# Patient Record
Sex: Male | Born: 1955
Health system: Southern US, Community
[De-identification: ages and names within clinical notes are randomized; demographics above are authoritative.]

## PROBLEM LIST (undated history)

## (undated) DIAGNOSIS — K219 Gastro-esophageal reflux disease without esophagitis: Secondary | ICD-10-CM

## (undated) DIAGNOSIS — F419 Anxiety disorder, unspecified: Secondary | ICD-10-CM

## (undated) DIAGNOSIS — M1990 Unspecified osteoarthritis, unspecified site: Secondary | ICD-10-CM

## (undated) DIAGNOSIS — F329 Major depressive disorder, single episode, unspecified: Secondary | ICD-10-CM

## (undated) DIAGNOSIS — I771 Stricture of artery: Secondary | ICD-10-CM

## (undated) DIAGNOSIS — K2210 Ulcer of esophagus without bleeding: Secondary | ICD-10-CM

## (undated) DIAGNOSIS — Z8673 Personal history of transient ischemic attack (TIA), and cerebral infarction without residual deficits: Secondary | ICD-10-CM

## (undated) DIAGNOSIS — E785 Hyperlipidemia, unspecified: Secondary | ICD-10-CM

## (undated) DIAGNOSIS — E119 Type 2 diabetes mellitus without complications: Secondary | ICD-10-CM

## (undated) DIAGNOSIS — F32A Depression, unspecified: Secondary | ICD-10-CM

## (undated) DIAGNOSIS — I739 Peripheral vascular disease, unspecified: Secondary | ICD-10-CM

## (undated) DIAGNOSIS — T7840XA Allergy, unspecified, initial encounter: Secondary | ICD-10-CM

## (undated) DIAGNOSIS — D128 Benign neoplasm of rectum: Secondary | ICD-10-CM

## (undated) DIAGNOSIS — I639 Cerebral infarction, unspecified: Secondary | ICD-10-CM

## (undated) HISTORY — DX: Hyperlipidemia, unspecified: E78.5

## (undated) HISTORY — DX: Cerebral infarction, unspecified: I63.9

## (undated) HISTORY — DX: Stricture of artery: I77.1

## (undated) HISTORY — DX: Major depressive disorder, single episode, unspecified: F32.9

## (undated) HISTORY — DX: Depression, unspecified: F32.A

## (undated) HISTORY — PX: COLONOSCOPY: SHX174

## (undated) HISTORY — DX: Type 2 diabetes mellitus without complications: E11.9

## (undated) HISTORY — DX: Gastro-esophageal reflux disease without esophagitis: K21.9

## (undated) HISTORY — DX: Peripheral vascular disease, unspecified: I73.9

## (undated) HISTORY — PX: COLON SURGERY: SHX602

## (undated) HISTORY — DX: Benign neoplasm of rectum: D12.8

## (undated) HISTORY — PX: APPENDECTOMY: SHX54

## (undated) HISTORY — DX: Ulcer of esophagus without bleeding: K22.10

## (undated) HISTORY — DX: Allergy, unspecified, initial encounter: T78.40XA

## (undated) HISTORY — DX: Personal history of transient ischemic attack (TIA), and cerebral infarction without residual deficits: Z86.73

## (undated) HISTORY — DX: Anxiety disorder, unspecified: F41.9

## (undated) HISTORY — DX: Unspecified osteoarthritis, unspecified site: M19.90

---
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
## 1993-01-30 ENCOUNTER — Encounter: Payer: Self-pay | Admitting: Gastroenterology

## 1999-01-08 ENCOUNTER — Encounter: Payer: Self-pay | Admitting: Gastroenterology

## 2000-08-07 ENCOUNTER — Encounter: Payer: Self-pay | Admitting: Emergency Medicine

## 2000-08-07 ENCOUNTER — Encounter: Payer: Self-pay | Admitting: Neurology

## 2000-08-07 ENCOUNTER — Inpatient Hospital Stay (HOSPITAL_COMMUNITY): Admission: AD | Admit: 2000-08-07 | Discharge: 2000-08-11 | Payer: Self-pay | Admitting: Neurology

## 2000-08-10 ENCOUNTER — Encounter: Payer: Self-pay | Admitting: Neurology

## 2004-04-02 ENCOUNTER — Encounter: Payer: Self-pay | Admitting: Internal Medicine

## 2005-01-03 ENCOUNTER — Ambulatory Visit: Payer: Self-pay | Admitting: Internal Medicine

## 2005-01-10 ENCOUNTER — Ambulatory Visit: Payer: Self-pay | Admitting: Internal Medicine

## 2005-04-10 ENCOUNTER — Ambulatory Visit: Payer: Self-pay | Admitting: Internal Medicine

## 2005-04-30 ENCOUNTER — Ambulatory Visit: Payer: Self-pay | Admitting: Internal Medicine

## 2005-10-30 ENCOUNTER — Ambulatory Visit: Payer: Self-pay | Admitting: Internal Medicine

## 2005-11-27 ENCOUNTER — Ambulatory Visit: Payer: Self-pay | Admitting: Internal Medicine

## 2006-02-23 ENCOUNTER — Ambulatory Visit: Payer: Self-pay | Admitting: Internal Medicine

## 2006-03-02 ENCOUNTER — Ambulatory Visit: Payer: Self-pay | Admitting: Internal Medicine

## 2006-06-26 ENCOUNTER — Ambulatory Visit: Payer: Self-pay | Admitting: Internal Medicine

## 2006-07-15 ENCOUNTER — Ambulatory Visit: Payer: Self-pay | Admitting: Internal Medicine

## 2006-08-07 ENCOUNTER — Ambulatory Visit: Payer: Self-pay | Admitting: Internal Medicine

## 2007-01-05 ENCOUNTER — Ambulatory Visit: Payer: Self-pay | Admitting: Internal Medicine

## 2007-01-05 LAB — CONVERTED CEMR LAB
ALT: 46 units/L — ABNORMAL HIGH (ref 0–40)
AST: 36 units/L (ref 0–37)
Albumin: 4.3 g/dL (ref 3.5–5.2)
Alkaline Phosphatase: 48 units/L (ref 39–117)
BUN: 19 mg/dL (ref 6–23)
Basophils Absolute: 0 10*3/uL (ref 0.0–0.1)
Basophils Relative: 0.6 % (ref 0.0–1.0)
CO2: 32 meq/L (ref 19–32)
Calcium: 9.4 mg/dL (ref 8.4–10.5)
Chloride: 105 meq/L (ref 96–112)
Chol/HDL Ratio, serum: 4.3
Cholesterol: 145 mg/dL (ref 0–200)
Creatinine, Ser: 1.2 mg/dL (ref 0.4–1.5)
Eosinophil percent: 1.7 % (ref 0.0–5.0)
GFR calc non Af Amer: 68 mL/min
Glomerular Filtration Rate, Af Am: 83 mL/min/{1.73_m2}
Glucose, Bld: 107 mg/dL — ABNORMAL HIGH (ref 70–99)
HCT: 41.9 % (ref 39.0–52.0)
HDL: 34.1 mg/dL — ABNORMAL LOW (ref >39.0)
Hemoglobin: 14.3 g/dL (ref 13.0–17.0)
LDL Cholesterol: 79 mg/dL (ref 0–99)
Lymphocytes Relative: 36.4 % (ref 12.0–46.0)
MCHC: 34.2 g/dL (ref 30.0–36.0)
MCV: 90.2 fL (ref 78.0–100.0)
Monocytes Absolute: 0.6 10*3/uL (ref 0.2–0.7)
Monocytes Relative: 11.6 % — ABNORMAL HIGH (ref 3.0–11.0)
Neutro Abs: 2.5 10*3/uL (ref 1.4–7.7)
Neutrophils Relative %: 49.7 % (ref 43.0–77.0)
PSA: 0.6 ng/mL (ref 0.10–4.00)
Platelets: 212 10*3/uL (ref 150–400)
Potassium: 4.6 meq/L (ref 3.5–5.1)
RBC: 4.65 M/uL (ref 4.22–5.81)
RDW: 12.6 % (ref 11.5–14.6)
Sodium: 142 meq/L (ref 135–145)
TSH: 2.11 microintl units/mL (ref 0.35–5.50)
Total Bilirubin: 0.8 mg/dL (ref 0.3–1.2)
Total Protein: 6.9 g/dL (ref 6.0–8.3)
Triglyceride fasting, serum: 158 mg/dL — ABNORMAL HIGH (ref 0–149)
VLDL: 32 mg/dL (ref 0–40)
WBC: 5 10*3/uL (ref 4.5–10.5)

## 2007-01-12 ENCOUNTER — Ambulatory Visit: Payer: Self-pay | Admitting: Internal Medicine

## 2007-07-08 ENCOUNTER — Ambulatory Visit: Payer: Self-pay | Admitting: Internal Medicine

## 2007-07-08 LAB — CONVERTED CEMR LAB
ALT: 50 units/L (ref 0–53)
AST: 33 units/L (ref 0–37)
Albumin: 4.1 g/dL (ref 3.5–5.2)
Alkaline Phosphatase: 50 units/L (ref 39–117)
BUN: 15 mg/dL (ref 6–23)
Bilirubin, Direct: 0.1 mg/dL (ref 0.0–0.3)
CO2: 30 meq/L (ref 19–32)
Calcium: 9 mg/dL (ref 8.4–10.5)
Chloride: 99 meq/L (ref 96–112)
Cholesterol: 168 mg/dL (ref 0–200)
Creatinine, Ser: 1 mg/dL (ref 0.4–1.5)
GFR calc Af Amer: 102 mL/min
GFR calc non Af Amer: 84 mL/min
Glucose, Bld: 90 mg/dL (ref 70–99)
HDL: 30.8 mg/dL — ABNORMAL LOW (ref >39.0)
LDL Cholesterol: 67 mg/dL (ref 0–99)
Potassium: 3.9 meq/L (ref 3.5–5.1)
Sodium: 138 meq/L (ref 135–145)
Total Bilirubin: 0.8 mg/dL (ref 0.3–1.2)
Total CHOL/HDL Ratio: 5.5
Total Protein: 6.7 g/dL (ref 6.0–8.3)
Triglycerides: 349 mg/dL (ref 0–149)
VLDL: 70 mg/dL — ABNORMAL HIGH (ref 0–40)

## 2007-07-26 ENCOUNTER — Ambulatory Visit: Payer: Self-pay | Admitting: Internal Medicine

## 2007-07-26 DIAGNOSIS — K219 Gastro-esophageal reflux disease without esophagitis: Secondary | ICD-10-CM | POA: Insufficient documentation

## 2007-07-26 DIAGNOSIS — Z8673 Personal history of transient ischemic attack (TIA), and cerebral infarction without residual deficits: Secondary | ICD-10-CM | POA: Insufficient documentation

## 2007-07-26 DIAGNOSIS — E785 Hyperlipidemia, unspecified: Secondary | ICD-10-CM | POA: Insufficient documentation

## 2008-01-20 ENCOUNTER — Ambulatory Visit: Payer: Self-pay | Admitting: Internal Medicine

## 2008-01-20 LAB — CONVERTED CEMR LAB
ALT: 37 units/L (ref 0–53)
AST: 29 units/L (ref 0–37)
Albumin: 4.7 g/dL (ref 3.5–5.2)
Alkaline Phosphatase: 49 units/L (ref 39–117)
BUN: 22 mg/dL (ref 6–23)
Basophils Absolute: 0 10*3/uL (ref 0.0–0.1)
Basophils Relative: 0 % (ref 0.0–1.0)
Bilirubin Urine: NEGATIVE
Bilirubin, Direct: 0.1 mg/dL (ref 0.0–0.3)
CO2: 31 meq/L (ref 19–32)
Calcium: 9.8 mg/dL (ref 8.4–10.5)
Chloride: 101 meq/L (ref 96–112)
Cholesterol: 129 mg/dL (ref 0–200)
Creatinine, Ser: 1.2 mg/dL (ref 0.4–1.5)
Eosinophils Absolute: 0.1 10*3/uL (ref 0.0–0.6)
Eosinophils Relative: 1.1 % (ref 0.0–5.0)
GFR calc Af Amer: 82 mL/min
GFR calc non Af Amer: 68 mL/min
Glucose, Bld: 84 mg/dL (ref 70–99)
HCT: 42.3 % (ref 39.0–52.0)
HDL: 30.6 mg/dL — ABNORMAL LOW (ref >39.0)
Hemoglobin: 15.1 g/dL (ref 13.0–17.0)
Ketones, urine, test strip: NEGATIVE
LDL Cholesterol: 76 mg/dL (ref 0–99)
Lymphocytes Relative: 33.9 % (ref 12.0–46.0)
MCHC: 35.6 g/dL (ref 30.0–36.0)
MCV: 88.1 fL (ref 78.0–100.0)
Monocytes Absolute: 0.5 10*3/uL (ref 0.2–0.7)
Monocytes Relative: 9.1 % (ref 3.0–11.0)
Neutro Abs: 2.7 10*3/uL (ref 1.4–7.7)
Neutrophils Relative %: 55.9 % (ref 43.0–77.0)
Nitrite: NEGATIVE
PSA: 0.63 ng/mL (ref 0.10–4.00)
Platelets: 185 10*3/uL (ref 150–400)
Potassium: 4.3 meq/L (ref 3.5–5.1)
RBC: 4.8 M/uL (ref 4.22–5.81)
RDW: 12.2 % (ref 11.5–14.6)
Sodium: 139 meq/L (ref 135–145)
Specific Gravity, Urine: 1.025
TSH: 1.85 microintl units/mL (ref 0.35–5.50)
Total Bilirubin: 0.8 mg/dL (ref 0.3–1.2)
Total CHOL/HDL Ratio: 4.2
Total Protein: 7.1 g/dL (ref 6.0–8.3)
Triglycerides: 111 mg/dL (ref 0–149)
Urobilinogen, UA: 0.2
VLDL: 22 mg/dL (ref 0–40)
WBC Urine, dipstick: NEGATIVE
WBC: 5 10*3/uL (ref 4.5–10.5)
pH: 6.5

## 2008-01-27 ENCOUNTER — Ambulatory Visit: Payer: Self-pay | Admitting: Internal Medicine

## 2008-07-20 ENCOUNTER — Ambulatory Visit: Payer: Self-pay | Admitting: Internal Medicine

## 2008-07-20 LAB — CONVERTED CEMR LAB
ALT: 34 units/L (ref 0–53)
AST: 29 units/L (ref 0–37)
Albumin: 4.4 g/dL (ref 3.5–5.2)
Alkaline Phosphatase: 50 units/L (ref 39–117)
BUN: 21 mg/dL (ref 6–23)
Bilirubin, Direct: 0.1 mg/dL (ref 0.0–0.3)
CO2: 30 meq/L (ref 19–32)
Calcium: 9.2 mg/dL (ref 8.4–10.5)
Chloride: 103 meq/L (ref 96–112)
Cholesterol: 174 mg/dL (ref 0–200)
Creatinine, Ser: 1 mg/dL (ref 0.4–1.5)
Direct LDL: 85.6 mg/dL
GFR calc Af Amer: 102 mL/min
GFR calc non Af Amer: 84 mL/min
Glucose, Bld: 98 mg/dL (ref 70–99)
HDL: 31.2 mg/dL — ABNORMAL LOW (ref >39.0)
Potassium: 4 meq/L (ref 3.5–5.1)
Sodium: 140 meq/L (ref 135–145)
Total Bilirubin: 0.8 mg/dL (ref 0.3–1.2)
Total CHOL/HDL Ratio: 5.6
Total Protein: 6.7 g/dL (ref 6.0–8.3)
Triglycerides: 354 mg/dL (ref 0–149)
VLDL: 71 mg/dL — ABNORMAL HIGH (ref 0–40)

## 2008-08-21 ENCOUNTER — Ambulatory Visit: Payer: Self-pay | Admitting: Internal Medicine

## 2008-08-21 DIAGNOSIS — F329 Major depressive disorder, single episode, unspecified: Secondary | ICD-10-CM

## 2008-08-21 DIAGNOSIS — F32A Depression, unspecified: Secondary | ICD-10-CM | POA: Insufficient documentation

## 2008-11-14 ENCOUNTER — Telehealth: Payer: Self-pay | Admitting: Internal Medicine

## 2009-02-01 ENCOUNTER — Ambulatory Visit: Payer: Self-pay | Admitting: Internal Medicine

## 2009-02-01 LAB — CONVERTED CEMR LAB
AST: 32 units/L (ref 0–37)
Albumin: 4.4 g/dL (ref 3.5–5.2)
BUN: 24 mg/dL — ABNORMAL HIGH (ref 6–23)
Basophils Relative: 0.2 % (ref 0.0–3.0)
Bilirubin Urine: NEGATIVE
Blood in Urine, dipstick: NEGATIVE
Creatinine, Ser: 1.1 mg/dL (ref 0.4–1.5)
Direct LDL: 81.6 mg/dL
Eosinophils Absolute: 0.1 10*3/uL (ref 0.0–0.7)
Eosinophils Relative: 1.4 % (ref 0.0–5.0)
GFR calc Af Amer: 91 mL/min
GFR calc non Af Amer: 75 mL/min
Glucose, Urine, Semiquant: NEGATIVE
HCT: 42.7 % (ref 39.0–52.0)
Hemoglobin: 14.6 g/dL (ref 13.0–17.0)
Ketones, urine, test strip: NEGATIVE
MCV: 92.2 fL (ref 78.0–100.0)
Monocytes Absolute: 0.5 10*3/uL (ref 0.1–1.0)
Nitrite: NEGATIVE
RBC: 4.64 M/uL (ref 4.22–5.81)
Specific Gravity, Urine: 1.015
Total Protein: 6.8 g/dL (ref 6.0–8.3)
Urobilinogen, UA: 0.2
WBC Urine, dipstick: NEGATIVE
WBC: 5.8 10*3/uL (ref 4.5–10.5)
pH: 7

## 2009-02-08 ENCOUNTER — Ambulatory Visit: Payer: Self-pay | Admitting: Internal Medicine

## 2009-04-02 ENCOUNTER — Telehealth: Payer: Self-pay | Admitting: Internal Medicine

## 2009-04-12 ENCOUNTER — Ambulatory Visit: Payer: Self-pay | Admitting: Internal Medicine

## 2009-04-18 LAB — CONVERTED CEMR LAB
Alkaline Phosphatase: 46 units/L (ref 39–117)
Bilirubin, Direct: 0.1 mg/dL (ref 0.0–0.3)
Cholesterol: 151 mg/dL (ref 0–200)
LDL Cholesterol: 92 mg/dL (ref 0–99)
Total Bilirubin: 0.7 mg/dL (ref 0.3–1.2)
Total Protein: 6.9 g/dL (ref 6.0–8.3)

## 2009-10-01 ENCOUNTER — Telehealth: Payer: Self-pay | Admitting: Internal Medicine

## 2009-10-26 ENCOUNTER — Ambulatory Visit: Payer: Self-pay | Admitting: Internal Medicine

## 2009-10-26 LAB — CONVERTED CEMR LAB
ALT: 40 units/L (ref 0–53)
Bilirubin, Direct: 0 mg/dL (ref 0.0–0.3)
Direct LDL: 106.8 mg/dL
HDL: 34.9 mg/dL — ABNORMAL LOW (ref >39.00)
Total Bilirubin: 0.6 mg/dL (ref 0.3–1.2)
Total CHOL/HDL Ratio: 5
VLDL: 49.6 mg/dL — ABNORMAL HIGH (ref 0.0–40.0)

## 2009-11-05 ENCOUNTER — Ambulatory Visit: Payer: Self-pay | Admitting: Internal Medicine

## 2010-05-10 ENCOUNTER — Ambulatory Visit: Payer: Self-pay | Admitting: Internal Medicine

## 2010-05-10 LAB — CONVERTED CEMR LAB
ALT: 41 units/L (ref 0–53)
AST: 30 units/L (ref 0–37)
BUN: 18 mg/dL (ref 6–23)
Basophils Absolute: 0 10*3/uL (ref 0.0–0.1)
Bilirubin Urine: NEGATIVE
Bilirubin, Direct: 0 mg/dL (ref 0.0–0.3)
Blood in Urine, dipstick: NEGATIVE
Cholesterol: 161 mg/dL (ref 0–200)
Creatinine, Ser: 1 mg/dL (ref 0.4–1.5)
Eosinophils Relative: 1.6 % (ref 0.0–5.0)
GFR calc non Af Amer: 81.35 mL/min (ref >60)
Glucose, Bld: 99 mg/dL (ref 70–99)
Glucose, Urine, Semiquant: NEGATIVE
HCT: 39.8 % (ref 39.0–52.0)
HDL: 34.4 mg/dL — ABNORMAL LOW (ref >39.00)
Ketones, urine, test strip: NEGATIVE
Lymphs Abs: 1.8 10*3/uL (ref 0.7–4.0)
Monocytes Absolute: 0.4 10*3/uL (ref 0.1–1.0)
Monocytes Relative: 7.9 % (ref 3.0–12.0)
Neutrophils Relative %: 56.1 % (ref 43.0–77.0)
PSA: 0.81 ng/mL (ref 0.10–4.00)
Platelets: 199 10*3/uL (ref 150.0–400.0)
Protein, U semiquant: NEGATIVE
RDW: 13.1 % (ref 11.5–14.6)
Specific Gravity, Urine: 1.015
TSH: 2.66 microintl units/mL (ref 0.35–5.50)
Total Bilirubin: 0.4 mg/dL (ref 0.3–1.2)
Triglycerides: 376 mg/dL — ABNORMAL HIGH (ref 0.0–149.0)
VLDL: 75.2 mg/dL — ABNORMAL HIGH (ref 0.0–40.0)
WBC: 5.3 10*3/uL (ref 4.5–10.5)
pH: 6

## 2010-05-20 ENCOUNTER — Ambulatory Visit: Payer: Self-pay | Admitting: Internal Medicine

## 2010-05-20 DIAGNOSIS — M549 Dorsalgia, unspecified: Secondary | ICD-10-CM | POA: Insufficient documentation

## 2010-06-03 ENCOUNTER — Encounter (INDEPENDENT_AMBULATORY_CARE_PROVIDER_SITE_OTHER): Payer: Self-pay | Admitting: *Deleted

## 2010-07-25 ENCOUNTER — Ambulatory Visit: Payer: Self-pay | Admitting: Gastroenterology

## 2010-07-29 DIAGNOSIS — D128 Benign neoplasm of rectum: Secondary | ICD-10-CM

## 2010-07-29 HISTORY — DX: Benign neoplasm of rectum: D12.8

## 2010-08-06 ENCOUNTER — Ambulatory Visit: Payer: Self-pay | Admitting: Gastroenterology

## 2010-08-09 ENCOUNTER — Encounter: Payer: Self-pay | Admitting: Gastroenterology

## 2010-11-06 ENCOUNTER — Ambulatory Visit: Payer: Self-pay | Admitting: Internal Medicine

## 2010-11-06 LAB — CONVERTED CEMR LAB
ALT: 47 units/L (ref 0–53)
AST: 34 units/L (ref 0–37)
Albumin: 4.4 g/dL (ref 3.5–5.2)
Cholesterol: 181 mg/dL (ref 0–200)
Direct LDL: 96.5 mg/dL
HDL: 37.9 mg/dL — ABNORMAL LOW (ref >39.00)
Total Bilirubin: 0.5 mg/dL (ref 0.3–1.2)
Total Protein: 6.7 g/dL (ref 6.0–8.3)
Triglycerides: 306 mg/dL — ABNORMAL HIGH (ref 0.0–149.0)

## 2010-11-13 ENCOUNTER — Ambulatory Visit: Payer: Self-pay | Admitting: Internal Medicine

## 2011-01-06 ENCOUNTER — Ambulatory Visit
Admission: RE | Admit: 2011-01-06 | Discharge: 2011-01-06 | Payer: Self-pay | Source: Home / Self Care | Attending: Internal Medicine | Admitting: Internal Medicine

## 2011-01-22 ENCOUNTER — Telehealth: Payer: Self-pay | Admitting: Internal Medicine

## 2011-01-24 ENCOUNTER — Encounter
Admission: RE | Admit: 2011-01-24 | Discharge: 2011-01-28 | Payer: Self-pay | Source: Home / Self Care | Attending: Internal Medicine | Admitting: Internal Medicine

## 2011-01-28 NOTE — Letter (Signed)
Summary: Patient Notice- Polyp Results  [facility]  [address] [place], [place] [address]   Phone: [phone]  Fax: [phone]        August 09, 2010 MRN: [id number]    [facility] [address] [place], [place]  [address]    Dear Darrell Gaines,  I am pleased to inform you that the colon polyp(s) removed during your recent colonoscopy was (were) found to be benign (no cancer detected) upon pathologic examination.  I recommend you have a repeat colonoscopy examination in 5 years to look for recurrent polyps, as having colon polyps increases your risk for having recurrent polyps or even colon cancer in the future.  Should you develop new or worsening symptoms of abdominal pain, bowel habit changes or bleeding from the rectum or bowels, please schedule an evaluation with either your primary care physician or with me.  Continue treatment plan as outlined the day of your exam.  Please call us if you are having persistent problems or have questions about your condition that have not been fully answered at this time.  Sincerely,  Meryl Dare MD [facility]  This letter has been electronically signed by your physician.  Appended Document: Patient Notice- Polyp Results letter mailed

## 2011-01-28 NOTE — Letter (Signed)
Summary: [facility]   Imported By: Sherian Rein 07/26/2010 09:49:43  _____________________________________________________________________  External Attachment:    Type:   Image     Comment:   External Document

## 2011-01-28 NOTE — Procedures (Signed)
Summary: Colonoscopy  Patient: Darrell Gaines Note: All result statuses are Final unless otherwise noted.  Tests: (1) Colonoscopy (COL)   COL Colonoscopy           DONE     [facility]     [address]     [place], [place]  [address]           COLONOSCOPY PROCEDURE REPORT           PATIENT:  Darrell Gaines, Darrell Gaines  MR#:  [id number]     BIRTHDATE:  [birth year], 53 yrs. old  GENDER:  male     ENDOSCOPIST:  Judie Petit T. Russella Dar, MD, [facility]           PROCEDURE DATE:  08/06/2010     PROCEDURE:  Colonoscopy with biopsy     ASA CLASS:  Class II     INDICATIONS:  1) Routine Risk Screening     MEDICATIONS:   Fentanyl 75 mcg IV, Versed 7 mg IV     DESCRIPTION OF PROCEDURE:   After the risks benefits and     alternatives of the procedure were thoroughly explained, informed     consent was obtained.  Digital rectal exam was performed and     revealed no abnormalities.   The LB [id number]-[id number] endoscope     was introduced through the anus and advanced to the cecum, which     was identified by both the appendix and ileocecal valve, without     limitations.  The quality of the prep was excellent, using     MoviPrep.  The instrument was then slowly withdrawn as the colon     was fully examined.     <<PROCEDUREIMAGES>>     FINDINGS:  Two polyps were found in the cecum. They were 3 - 5 mm     in size. The polyps were removed using cold biopsy forceps.  A     sessile polyp was found in the sigmoid colon. It was 4 mm in size.     The polyps were removed using cold biopsy forceps.  A normal     appearing ileocecal valve, and appendiceal orifice were     identified. The ascending, hepatic flexure, transverse, splenic     flexure, descending colon, and rectum appeared unremarkable.     Retroflexed views in the rectum revealed internal hemorrhoids,     small. The time to cecum =  2.5  minutes. The scope was then     withdrawn (time =  11.25  min) from the patient and the procedure     completed.          COMPLICATIONS:  None           ENDOSCOPIC IMPRESSION:     1) 3 - 5 mm Two polyps in the cecum     2) 4 mm sessile polyp in the sigmoid colon     3) Internal hemorrhoids     RECOMMENDATIONS:     1) Await pathology results     2) If the polyps removed today are adenomatous (pre-cancerous),     you will need a repeat colonoscopy in 5 years. Otherwise you     should continue to follow colorectal cancer screening guidelines     for "routine risk" patients with colonoscopy in 10 years.     Venita Lick. Russella Dar, MD, [facility]           CC: Lindley Magnus, MD  n.     eSIGNED:   Malcolm T. Stark at 08/06/2010 03:05 PM           Carita Pian, [id number]  Note: An exclamation Andra (!) indicates a result that was not dispersed into the flowsheet. Document Creation Date: 08/06/2010 3:06 PM _______________________________________________________________________  (1) Order result status: Final Collection or observation date-time: 08/06/2010 15:00 Requested date-time:  Receipt date-time:  Reported date-time:  Referring Physician:   Ordering Physician: Claudette Head [phone]) Specimen Source:  Source: Launa Grill Order Number: [id number] Lab site:   Appended Document: Colonoscopy     Procedures Next Due Date:    Colonoscopy: 07/2015

## 2011-01-28 NOTE — Assessment & Plan Note (Signed)
Summary: 6 month rov/njr   Vital Signs:  Patient profile:   55 year old male Weight:      195 pounds Temp:     98.6 degrees F oral Pulse rate:   72 / minute Pulse rhythm:   regular BP sitting:   152 / 84  (left arm) Cuff size:   regular  Vitals Entered By: Alfred Levins, CMA (November 13, 2010 8:28 AM) CC: discuss lab results   Primary Care Provider:  Birdie Sons, MD  CC:  discuss lab results.  History of Present Illness: LBP ongoing for years.  he is exercising and stretching  stroke-no recurrence  lipids---tolerating meds.   All other systems reviewed and were negative   Current Medications (verified): 1)  Plavix 75 Mg Tabs (Clopidogrel Bisulfate) .... One By Mouth Daily 2)  Crestor 20 Mg Tabs (Rosuvastatin Calcium) .Marland Kitchen.. 1 Tablet By Mouth Daily 3)  Aspirin 325 Mg Tabs (Aspirin) .... Once Daily 4)  Fish Oil 1000 Mg Caps (Omega-3 Fatty Acids) .... 3 By Mouth Once Daily 5)  Pantoprazole Sodium 40 Mg  Tbec (Pantoprazole Sodium) .Marland Kitchen.. 1 Each Day 30 Minutes Before Meal Every Am  Allergies (verified): No Known Drug Allergies  Past History:  Past Medical History: Last updated: 07/19/2010 GERD Hyperlipidemia Cerebrovascular accident, hx of Depression Hypertension Hemorrhoids Anxiety Disorder  Past Surgical History: Last updated: 07/26/2007 rectal polyp  Family History: Last updated: 07/25/2010 mother parkinsons Family History of CAD Male 1st degree relative -father MI at 40 Family History of Heart Disease: Maternal Grandfather No FH of Colon Cancer:  Social History: Last updated: 07/25/2010 Occupation: pace communication Married Never Smoked Regular exercise-yes Alcohol Use - no Daily Caffeine Use  Risk Factors: Exercise: yes (07/26/2007)  Risk Factors: Smoking Status: never (05/20/2010)  Physical Exam  General:  well-developed well-nourished male in no acute distress. HEENT exam atraumatic, normocephalic symmetric her muscles are intact. Neck  is supple. Chest clear auscultation. Cardiac exam S1-S2 are regular. Abdominal exam active bowel sounds, soft and nontender. Is no clubbing cyanosis or edema. Neurologic exam is alert and oriented without any motor or sensory deficits.   Impression & Recommendations:  Problem # 1:  BACK PAIN (ICD-724.5) given the duration of back pain I think we should further evaluate. We'll check an x-ray. His updated medication list for this problem includes:    Aspirin 325 Mg Tabs (Aspirin) ..... Once daily  Orders: T-Lumbar Spine Complete, 5 Views (71110TC)  Problem # 2:  CEREBROVASCULAR ACCIDENT, HX OF (ICD-V12.50) no recurrence.  Problem # 3:  HYPERLIPIDEMIA (ICD-272.4) reasonable control. Continue current medications. His updated medication list for this problem includes:    Crestor 20 Mg Tabs (Rosuvastatin calcium) .Marland Kitchen... 1 tablet by mouth daily    Niacin 500 Mg Tabs (Niacin) .Marland Kitchen... 2 by mouth at bedtime  Labs Reviewed: SGOT: 34 (11/06/2010)   SGPT: 47 (11/06/2010)   HDL:37.90 (11/06/2010), 34.40 (05/10/2010)  LDL:92 (04/12/2009), DEL (16/09/9603)  Chol:181 (11/06/2010), 161 (05/10/2010)  Trig:306.0 (11/06/2010), 376.0 (05/10/2010)  Problem # 4:  GERD (ICD-530.81)  His updated medication list for this problem includes:    Pantoprazole Sodium 40 Mg Tbec (Pantoprazole sodium) .Marland Kitchen... 1 each day 30 minutes before meal every am  Complete Medication List: 1)  Plavix 75 Mg Tabs (Clopidogrel bisulfate) .... One by mouth daily 2)  Crestor 20 Mg Tabs (Rosuvastatin calcium) .Marland Kitchen.. 1 tablet by mouth daily 3)  Aspirin 325 Mg Tabs (Aspirin) .... Once daily 4)  Fish Oil 1000 Mg Caps (Omega-3 fatty acids) .Marland KitchenMarland KitchenMarland Kitchen  3 by mouth once daily 5)  Pantoprazole Sodium 40 Mg Tbec (Pantoprazole sodium) .Marland Kitchen.. 1 each day 30 minutes before meal every am 6)  Niacin 500 Mg Tabs (Niacin) .... 2 by mouth at bedtime 7)  Melatonin 5 Mg Tabs (Melatonin) .... Take 1 tab by mouth at bedtime 8)  Trazodone Hcl 50 Mg Tabs (Trazodone  hcl) .... 1/2-1 by mouth at bedtime as needed insomnia use if melatonin does not work  Patient Instructions: 1)  Please schedule a follow-up appointment in 6 months. CPX Prescriptions: TRAZODONE HCL 50 MG TABS (TRAZODONE HCL) 1/2-1 by mouth at bedtime as needed insomnia use if melatonin does not work  #30 x 0   Entered and Authorized by:   Birdie Sons MD   Signed by:   Birdie Sons MD on 11/13/2010   Method used:   Print then Give to Patient   RxID:   [id number]    Orders Added: 1)  Est. Patient Level IV [606[address]] 2)  T-Lumbar Spine Complete, 5 Views [71110TC]

## 2011-01-28 NOTE — Procedures (Signed)
Summary: Upper Endoscopy  Patient: Darrell Gaines Note: All result statuses are Final unless otherwise noted.  Tests: (1) Upper Endoscopy (EGD)   EGD Upper Endoscopy       DONE     [facility]     [address]     [place], [place]  [address]           ENDOSCOPY PROCEDURE REPORT           PATIENT:  Darrell Gaines, Darrell Gaines  MR#:  [id number]     BIRTHDATE:  1956/05/08, 53 yrs. old  GENDER:  male     ENDOSCOPIST:  Judie Petit T. Russella Dar, MD, [facility]           PROCEDURE DATE:  08/06/2010     PROCEDURE:  EGD, diagnostic     ASA CLASS:  Class II     INDICATIONS:  GERD, Screening for Barrett's esopahgus in a GERD     patient.     MEDICATIONS:  There was residual sedation effect present from     prior procedure, Fentanyl 25 mcg IV, Versed 3 mg IV     TOPICAL ANESTHETIC:  Exactacain Spray     DESCRIPTION OF PROCEDURE:   After the risks benefits and     alternatives of the procedure were thoroughly explained, informed     consent was obtained.  The [facility] GIF-H180 [id number] endoscope was     introduced through the mouth and advanced to the second portion of     the duodenum, without limitations.  The instrument was slowly     withdrawn as the mucosa was fully examined.     <<PROCEDUREIMAGES>>     Esophagitis was found in the distal esophagus. It was erosive. LA     Classifcation Grade A. The stomach was entered and closely     examined. The pylorus, antrum, angularis, and lesser curvature     were well visualized, including a retroflexed view of the cardia     and fundus. The stomach wall was normally distensable. The scope     passed easily through the pylorus into the duodenum. The duodenal     bulb was normal in appearance, as was the postbulbar duodenum.     Otherwise the examination was normal. Retroflexed views revealed     no abnormalities. The scope was then withdrawn from the patient     and the procedure completed.           COMPLICATIONS:  None           ENDOSCOPIC IMPRESSION:     1)  Erosive esophagitis           RECOMMENDATIONS:     1) Anti-reflux regimen     2) PPI qam: pantoprazole 40mg  po qam, #30, 11 refills     3) DC ranitidine     4) Resume Plavix today           Malcolm T. Russella Dar, MD, [facility]           CC:  Lindley Magnus, MD           n.     Rosalie DoctorVenita Lick. Stark at 08/06/2010 03:16 PM           Carita Pian, [id number]  Note: An exclamation Sovereign (!) indicates a result that was not dispersed into the flowsheet. Document Creation Date: 08/06/2010 3:17 PM _______________________________________________________________________  (1) Order result status: Final Collection or observation date-time: 08/06/2010 15:09 Requested date-time:  Receipt date-time:  Reported date-time:  Referring Physician:   Ordering Physician: Claudette Head [phone]) Specimen Source:  Source: Launa Grill Order Number: [id number] Lab site:

## 2011-01-28 NOTE — Letter (Signed)
Summary: [facility] Instructions  [facility]  [address] [place], [place] [address]   Phone: [phone]  Fax: [phone]       Darrell Gaines    04/17/1956    MRN: [id number]        Procedure Day /Date: Tuesday August 9th, 2011     Arrival Time: 2:00pm     Procedure Time: 3:00pm     Location of Procedure:                    _ x_  [facility] (4th Floor)                        PREPARATION FOR COLONOSCOPY WITH MOVIPREP   Starting 5 days prior to your procedure8/4/11 do not eat nuts, seeds, popcorn, corn, beans, peas,  salads, or any raw vegetables.  Do not take any fiber supplements (e.g. Metamucil, Citrucel, and Benefiber).  THE DAY BEFORE YOUR PROCEDURE         DATE: 08/05/10  DAY: Monday  1.  Drink clear liquids the entire day-NO SOLID FOOD  2.  Do not drink anything colored red or purple.  Avoid juices with pulp.  No orange juice.  3.  Drink at least 64 oz. (8 glasses) of fluid/clear liquids during the day to prevent dehydration and help the prep work efficiently.  CLEAR LIQUIDS INCLUDE: Water Jello Ice Popsicles Tea (sugar ok, no milk/cream) Powdered fruit flavored drinks Coffee (sugar ok, no milk/cream) Gatorade Juice: apple, white grape, white cranberry  Lemonade Clear bullion, consomm, broth Carbonated beverages (any kind) Strained chicken noodle soup Hard Candy                             4.  In the morning, mix first dose of MoviPrep solution:    Empty 1 Pouch A and 1 Pouch B into the disposable container    Add lukewarm drinking water to the top line of the container. Mix to dissolve    Refrigerate (mixed solution should be used within 24 hrs)  5.  Begin drinking the prep at 5:00 p.m. The MoviPrep container is divided by 4 marks.   Every 15 minutes drink the solution down to the next Darrell Gaines (approximately 8 oz) until the full liter is complete.   6.  Follow completed prep with 16 oz of clear liquid of your choice (Nothing  red or purple).  Continue to drink clear liquids until bedtime.  7.  Before going to bed, mix second dose of MoviPrep solution:    Empty 1 Pouch A and 1 Pouch B into the disposable container    Add lukewarm drinking water to the top line of the container. Mix to dissolve    Refrigerate  THE DAY OF YOUR PROCEDURE      DATE:08/06/10 DAY: Tuesday  Beginning at 10:00 a.m. (5 hours before procedure):         1. Every 15 minutes, drink the solution down to the next Darrell Gaines (approx 8 oz) until the full liter is complete.  2. Follow completed prep with 16 oz. of clear liquid of your choice.    3. You may drink clear liquids until1:00pm (2 HOURS BEFORE PROCEDURE).   MEDICATION INSTRUCTIONS  Unless otherwise instructed, you should take regular prescription medications with a small sip of water   as early as possible the morning of your procedure.  Stop  taking Plavix or Aggrenox on  _  _  (7 days before procedure).     Additional medication instructions: You will be contacted by our office prior to your procedure for directions on holding your Plavix.  If you do not hear from our office 1 week prior to your scheduled procedure, please call [phone] to discuss.          OTHER INSTRUCTIONS  You will need a responsible adult at least 54 years of age to accompany you and drive you home.   This person must remain in the waiting room during your procedure.  Wear loose fitting clothing that is easily removed.  Leave jewelry and other valuables at home.  However, you may wish to bring a book to read or  an iPod/MP3 player to listen to music as you wait for your procedure to start.  Remove all body piercing jewelry and leave at home.  Total time from sign-in until discharge is approximately 2-3 hours.  You should go home directly after your procedure and rest.  You can resume normal activities the  day after your procedure.  The day of your procedure you should not:   Drive   Make  legal decisions   Operate machinery   Drink alcohol   Return to work  You will receive specific instructions about eating, activities and medications before you leave.    The above instructions have been reviewed and explained to me by   Darrell Gaines.     I fully understand and can verbalize these instructions _____________________________ Date _________

## 2011-01-28 NOTE — Assessment & Plan Note (Signed)
Summary: cpx/mm/pt rsc/cjr   Vital Signs:  Patient profile:   55 year old male Height:      71 inches Weight:      198 pounds BMI:     27.72 Pulse rate:   68 / minute Pulse rhythm:   regular Resp:     12 per minute BP sitting:   124 / 82  (left arm) Cuff size:   regular  Vitals Entered By: Gladis Riffle, RN (May 20, 2010 10:48 AM) CC: cpx, labs done Is Patient Diabetic? No   CC:  cpx and labs done.  History of Present Illness: CPX  notes back pain with running---better when he is not active.   notes right medial elbow soreness with acitivity  All other systems reviewed and were negative  All other systems reviewed and were negative   Preventive Screening-Counseling & Management  Alcohol-Tobacco     Smoking Status: never  Current Problems (verified): 1)  Depression  (ICD-311) 2)  Physical Examination  (ICD-V70.0) 3)  Cerebrovascular Accident, Hx of  (ICD-V12.50) 4)  Hyperlipidemia  (ICD-272.4) 5)  Gerd  (ICD-530.81)  Current Medications (verified): 1)  Paroxetine Hcl 10 Mg Tabs (Paroxetine Hcl) .... One By Mouth Daily 2)  Plavix 75 Mg Tabs (Clopidogrel Bisulfate) .... One By Mouth Daily 3)  Crestor 20 Mg Tabs (Rosuvastatin Calcium) .Marland Kitchen.. 1 Tablet By Mouth Daily 4)  Zantac 150 Mg Tabs (Ranitidine Hcl) .... Take 1 Tablet By Mouth Two Times A Day 5)  Aspirin 325 Mg Tabs (Aspirin) .... Once Daily  Allergies (verified): No Known Drug Allergies  Past History:  Past Medical History: Last updated: 08/21/2008 GERD Hyperlipidemia Cerebrovascular accident, hx of Depression  Past Surgical History: Last updated: 07/26/2007 rectal polyp  Family History: Last updated: 01/27/2008 mother parkinsons Family History of CAD Male 1st degree relative -father MI at 32  Social History: Last updated: 01/27/2008 Occupation: pace communication Married Never Smoked Regular exercise-yes  Risk Factors: Exercise: yes (07/26/2007)  Risk Factors: Smoking Status: never  (05/20/2010)  Physical Exam  General:  alert and well-developed.   Head:  normocephalic and atraumatic.   Eyes:  pupils equal and pupils round.   Ears:  R ear normal and L ear normal.   Nose:  no external deformity and no external erythema.   Neck:  No deformities, masses, or tenderness noted. Chest Wall:  No deformities, masses, tenderness or gynecomastia noted. Lungs:  normal respiratory effort and no intercostal retractions.   Heart:  normal rate and regular rhythm.   Abdomen:  Bowel sounds positive,abdomen soft and non-tender without masses, organomegaly or hernias noted. Rectal:  no external abnormalities, no hemorrhoids, and normal sphincter tone.   Prostate:  no gland enlargement, no nodules, and no asymmetry.   Msk:  No deformity or scoliosis noted of thoracic or lumbar spine.   Pulses:  R radial normal and L radial normal.   Extremities:  No clubbing, cyanosis, edema, or deformity noted  Neurologic:  cranial nerves II-XII intact and gait normal.   Skin:  turgor normal and color normal.   Cervical Nodes:  no anterior cervical adenopathy and no posterior cervical adenopathy.   Psych:  normally interactive and good eye contact.     Impression & Recommendations:  Problem # 1:  PHYSICAL EXAMINATION (ICD-V70.0)  health maint UTD  Orders: Gastroenterology Referral (GI)  Problem # 2:  DEPRESSION (ICD-311) doing well on meds His updated medication list for this problem includes:    Paroxetine Hcl 10 Mg Tabs (Paroxetine  hcl) ..... One by mouth daily  Problem # 3:  HYPERLIPIDEMIA (ICD-272.4)  His updated medication list for this problem includes:    Crestor 20 Mg Tabs (Rosuvastatin calcium) .Marland Kitchen... 1 tablet by mouth daily  Labs Reviewed: SGOT: 30 (05/10/2010)   SGPT: 41 (05/10/2010)   HDL:34.40 (05/10/2010), 34.90 (10/26/2009)  LDL:92 (04/12/2009), DEL (16/09/9603)  Chol:161 (05/10/2010), 187 (10/26/2009)  Trig:376.0 (05/10/2010), 248.0 (10/26/2009)  Problem # 4:  GERD  (ICD-530.81)  His updated medication list for this problem includes:    Zantac 150 Mg Tabs (Ranitidine hcl) .Marland Kitchen... Take 1 tablet by mouth two times a day  Labs Reviewed: Hgb: 14.1 (05/10/2010)   Hct: 39.8 (05/10/2010)  Problem # 5:  BACK PAIN (ICD-724.5)  His updated medication list for this problem includes:    Aspirin 325 Mg Tabs (Aspirin) ..... Once daily lateral epicondylitis  Complete Medication List: 1)  Paroxetine Hcl 10 Mg Tabs (Paroxetine hcl) .... One by mouth daily 2)  Plavix 75 Mg Tabs (Clopidogrel bisulfate) .... One by mouth daily 3)  Crestor 20 Mg Tabs (Rosuvastatin calcium) .Marland Kitchen.. 1 tablet by mouth daily 4)  Zantac 150 Mg Tabs (Ranitidine hcl) .... Take 1 tablet by mouth two times a day 5)  Aspirin 325 Mg Tabs (Aspirin) .... Once daily 6)  Fish Oil 1000 Mg Caps (Omega-3 fatty acids) .... 3 by mouth once daily  Patient Instructions: 1)  Please schedule a follow-up appointment in 6 months. 2)  lipids 272.4 3)  liver 995.2  Prevention & Chronic Care Immunizations   Influenza vaccine: Historical  (10/17/2009)    Tetanus booster: 01/27/2008: Tdap    Pneumococcal vaccine: Not documented  Colorectal Screening   Hemoccult: Not documented    Colonoscopy: Not documented  Other Screening   PSA: 0.81  (05/10/2010)   Smoking status: never  (05/20/2010)  Lipids   Total Cholesterol: 161  (05/10/2010)   LDL: 92  (04/12/2009)   LDL Direct: 58.3  (05/10/2010)   HDL: 34.40  (05/10/2010)   Triglycerides: 376.0  (05/10/2010)    SGOT (AST): 30  (05/10/2010)   SGPT (ALT): 41  (05/10/2010)   Alkaline phosphatase: 50  (05/10/2010)   Total bilirubin: 0.4  (05/10/2010)  Self-Management Support :    Lipid self-management support: Not documented

## 2011-01-28 NOTE — Procedures (Signed)
Summary: Flexible Sigmoidoscopy/[facility]  Flexible Sigmoidoscopy/[facility]   Imported By: Sherian Rein 07/26/2010 09:48:16  _____________________________________________________________________  External Attachment:    Type:   Image     Comment:   External Document

## 2011-01-28 NOTE — Letter (Signed)
Summary: Anticoagulation Modification Letter  [facility]  [address] [place], [place] [address]   Phone: [phone]  Fax: [phone]    July 25, 2010  Re:    Darrell Gaines DOB:    [birth year] MRN:    [id number]    Dear Dr. Cato Mulligan,   We have scheduled the above patient for an endoscopic procedure. Our records show that  he/she is on anticoagulation therapy. Please advise as to how long the patient may come off their therapy of Plavix prior to the scheduled procedure(s) on 08/06/10.   Please fax back/or route the completed form to [place] at [phone].  Thank you for your help with this matter.  Sincerely,  Christie Nottingham CMA [facility])   Physician Recommendation:  Hold Plavix 5 days prior ________________  Hold Coumadin 5 days prior ____________  Other ______________________________     Appended Document: Anticoagulation Modification Letter may discontinue anticoagulation for 5 days prior to procedure  Appended Document: Anticoagulation Modification Letter Pt notified to come off 5 day before procedure per Dr. Cato Mulligan. Pt verbalized understanding.

## 2011-01-28 NOTE — Letter (Signed)
Summary: New Patient letter  [facility] Gastroenterology  [address] [place], [place] [address]   Phone: [phone]  Fax: [phone]       06/03/2010 MRN: [id number]  [facility] [address] [place], [place]  [address]  Dear Mr. Lantry,  Welcome to the Gastroenterology Division at [facility].    You are scheduled to see Dr. Russella Dar on 07-25-10 at 9:30a.m. on the 3rd floor at [facility], [address]  We ask that you try to arrive at our office 15 minutes prior to your appointment time to allow for check-in.  We would like you to complete the enclosed self-administered evaluation form prior to your visit and bring it with you on the day of your appointment.  We will review it with you.  Also, please bring a complete list of all your medications or, if you prefer, bring the medication bottles and we will list them.  Please bring your insurance card so that we may make a copy of it.  If your insurance requires a referral to see a specialist, please bring your referral form from your primary care physician.  Co-payments are due at the time of your visit and may be paid by cash, check or credit card.     Your office visit will consist of a consult with your physician (includes a physical exam), any laboratory testing he/she may order, scheduling of any necessary diagnostic testing (e.g. x-ray, ultrasound, CT-scan), and scheduling of a procedure (e.g. Endoscopy, Colonoscopy) if required.  Please allow enough time on your schedule to allow for any/all of these possibilities.    If you cannot keep your appointment, please call [phone] to cancel or reschedule prior to your appointment date.  This allows [place] the opportunity to schedule an appointment for another patient in need of care.  If you do not cancel or reschedule by 5 p.m. the business day prior to your appointment date, you will be charged a $50.00 late cancellation/no-show fee.    Thank you for choosing   Gastroenterology for your medical needs.  We appreciate the opportunity to care for you.  Please visit [place] at our website  to learn more about our practice.                     Sincerely,                                                             The Gastroenterology Division

## 2011-01-28 NOTE — Miscellaneous (Signed)
Summary: rx for pantoprazole  Clinical Lists Changes  Medications: Added new medication of PANTOPRAZOLE SODIUM 40 MG  TBEC (PANTOPRAZOLE SODIUM) 1 each day 30 minutes before meal every am - Signed Rx of PANTOPRAZOLE SODIUM 40 MG  TBEC (PANTOPRAZOLE SODIUM) 1 each day 30 minutes before meal every am;  #30 x 11;  Signed;  Entered by: Weston Brass;  Authorized by: Meryl Dare MD [facility];  Method used: Electronically to [facility]. #[id number]*, [address], [place], [place], [place]  [address], Ph: [phone], Fax: [phone] Observations: Added new observation of NKA: T (08/06/2010 15:29)    Prescriptions: PANTOPRAZOLE SODIUM 40 MG  TBEC (PANTOPRAZOLE SODIUM) 1 each day 30 minutes before meal every am  #30 x 11   Entered by:   Weston Brass   Authorized by:   Meryl Dare MD [facility]   Signed by:   Weston Brass on 08/06/2010   Method used:   Electronically to        [facility]. [phone]* (retail)       [address]       [place], [place]  [address]       Ph: [phone]       Fax: [phone]   RxID:   [id number]

## 2011-01-28 NOTE — Assessment & Plan Note (Signed)
Summary: discuss colon on BT--ch.   History of Present Illness Visit Type: consult Primary GI MD: Elie Goody MD [facility] Primary Provider: Birdie Sons, MD Requesting Provider: Birdie Sons, MD Chief Complaint: Colonoscopy screening on Blood Thinner History of Present Illness:   This is a 55 year old male with chronic GERD well-controlled on ranitidine at this time. He is due for colorectal cancer screening and has not previously had colonoscopy. He has a stable hypertension, hyperlipidemia, and depression. He is on Plavix chronically for a history of his cardiovascular accident about 10 years ago   GI Review of Systems      Denies abdominal pain, acid reflux, belching, bloating, chest pain, dysphagia with liquids, dysphagia with solids, heartburn, loss of appetite, nausea, vomiting, vomiting blood, weight loss, and  weight gain.        Denies anal fissure, black tarry stools, change in bowel habit, constipation, diarrhea, diverticulosis, fecal incontinence, heme positive stool, hemorrhoids, irritable bowel syndrome, jaundice, light color stool, liver problems, rectal bleeding, and  rectal pain.   Current Medications (verified): 1)  Plavix 75 Mg Tabs (Clopidogrel Bisulfate) .... One By Mouth Daily 2)  Crestor 20 Mg Tabs (Rosuvastatin Calcium) .Marland Kitchen.. 1 Tablet By Mouth Daily 3)  Zantac 150 Mg Tabs (Ranitidine Hcl) .... Take 1 Tablet By Mouth Two Times A Day 4)  Aspirin 325 Mg Tabs (Aspirin) .... Once Daily 5)  Fish Oil 1000 Mg Caps (Omega-3 Fatty Acids) .... 3 By Mouth Once Daily  Allergies (verified): No Known Drug Allergies  Past History:  Past Medical History: Reviewed history from 07/19/2010 and no changes required. GERD Hyperlipidemia Cerebrovascular accident, hx of Depression Hypertension Hemorrhoids Anxiety Disorder  Past Surgical History: Reviewed history from 07/26/2007 and no changes required. rectal polyp  Family History: Reviewed history from 07/19/2010 and  no changes required. mother parkinsons Family History of CAD Male 1st degree relative -father MI at 33 Family History of Heart Disease: Maternal Grandfather No FH of Colon Cancer:  Social History: Reviewed history from 01/27/2008 and no changes required. Occupation: pace communication Married Never Smoked Regular exercise-yes Alcohol Use - no Daily Caffeine Use  Review of Systems       The pertinent positives and negatives are noted as above and in the HPI. All other ROS were reviewed and were negative.   Vital Signs:  Patient profile:   55 year old male Height:      71 inches Weight:      193 pounds BMI:     27.02 Pulse rate:   64 / minute Pulse rhythm:   regular BP sitting:   128 / 84  (left arm)  Vitals Entered By: Milford Cage NCMA (July 25, 2010 9:32 AM)  Physical Exam  General:  Well developed, well nourished, no acute distress. Head:  Normocephalic and atraumatic. Eyes:  PERRLA, no icterus. Ears:  Normal auditory acuity. Mouth:  No deformity or lesions, dentition normal. Neck:  Supple; no masses or thyromegaly. Lungs:  Clear throughout to auscultation. Heart:  Regular rate and rhythm; no murmurs, rubs,  or bruits. Abdomen:  Soft, nontender and nondistended. No masses, hepatosplenomegaly or hernias noted. Normal bowel sounds. Rectal:  deferred until time of colonoscopy.   Pulses:  Normal pulses noted. Extremities:  No clubbing, cyanosis, edema or deformities noted. Neurologic:  Alert and  oriented x4;  grossly normal neurologically. Cervical Nodes:  No significant cervical adenopathy. Inguinal Nodes:  No significant inguinal adenopathy. Psych:  Alert and cooperative. Normal mood and affect.  Impression &  Recommendations:  Problem # 1:  GERD (ICD-530.81) Chronic GERD well-controlled on ranitidine and antireflux measures. Screening for Barrett's. The risks, benefits and alternatives to endoscopy with possible biopsy and possible dilation were discussed with  the patient and they consent to proceed. The procedure will be scheduled electively. Orders: Colon/Endo (Colon/Endo)  Problem # 2:  SCREENING COLORECTAL-CANCER (ICD-V76.51) Average risk for colorectal cancer. Obtain clearance from Dr. Cato Mulligan for 5 days hold of Plavix prior colonoscopy. The risks, benefits and alternatives to colonoscopy with possible biopsy and possible polypectomy were discussed with the patient and they consent to proceed. The procedure will be scheduled electively.  Problem # 3:  CEREBROVASCULAR ACCIDENT, HX OF (ICD-V12.50) See problem #2.  Patient Instructions: 1)  Colonoscopy brochure given.  2)  Upper Endoscopy brochure given.  3)  Copy sent to : Birdie Sons, MD 4)  The medication list was reviewed and reconciled.  All changed / newly prescribed medications were explained.  A complete medication list was provided to the patient / caregiver.  Prescriptions: MOVIPREP 100 GM  SOLR (PEG-KCL-NACL-NASULF-NA ASC-C) As per prep instructions.  #1 x 0   Entered by:   Christie Nottingham CMA (AAMA)   Authorized by:   Meryl Dare MD [facility]   Signed by:   Meryl Dare MD [facility] on 07/25/2010   Method used:   Electronically to        [facility]. [phone]* (retail)       [address]       [place], [place]  [address]       Ph: [phone]       Fax: [phone]   RxID:   [id number]

## 2011-01-30 NOTE — Progress Notes (Signed)
Summary: change of meds.  Phone Note Call from Patient Call back at [facility] Phone [phone] Call back at Work Phone [phone]   Summary of Call: Wants a generic for Plavix and a generic statin.  [facility]) for the next month's refills.  His insurance is changing. Initial call taken by: [facility] CMA AAMA,  January 22, 2011 1:28 PM  Follow-up for Phone Call         there is no generic for  Plavix.  change Crestor to atorvastatin 40 mg one by mouth daily. check liver and lipid panel Follow-up by: Birdie Sons MD,  January 23, 2011 5:32 AM  Additional Follow-up for Phone Call Additional follow up Details #1::        LMTCB Additional Follow-up by: Lynann Beaver CMA AAMA,  January 23, 2011 8:27 AM    Additional Follow-up for Phone Call Additional follow up Details #2::    rx called in, pt aware Follow-up by: Alfred Levins, CMA,  January 24, 2011 4:22 PM  New/Updated Medications: ATORVASTATIN CALCIUM 40 MG TABS (ATORVASTATIN CALCIUM) 1 by mouth once daily Prescriptions: ATORVASTATIN CALCIUM 40 MG TABS (ATORVASTATIN CALCIUM) 1 by mouth once daily  #30 x 11   Entered by:   Alfred Levins, CMA   Authorized by:   Birdie Sons MD   Signed by:   Alfred Levins, CMA on 01/24/2011   Method used:   Electronically to        [facility]. [phone]* (retail)       [address]       [place], [place]  [address]       Ph: [phone]       Fax: [phone]   RxID:   [id number]

## 2011-05-15 ENCOUNTER — Other Ambulatory Visit (INDEPENDENT_AMBULATORY_CARE_PROVIDER_SITE_OTHER): Payer: PRIVATE HEALTH INSURANCE | Admitting: Internal Medicine

## 2011-05-15 DIAGNOSIS — Z0000 Encounter for general adult medical examination without abnormal findings: Secondary | ICD-10-CM

## 2011-05-15 LAB — CBC WITH DIFFERENTIAL/PLATELET
Basophils Absolute: 0 10*3/uL (ref 0.0–0.1)
Eosinophils Absolute: 0 10*3/uL (ref 0.0–0.7)
Lymphocytes Relative: 33.5 % (ref 12.0–46.0)
MCHC: 34.6 g/dL (ref 30.0–36.0)
MCV: 90 fl (ref 78.0–100.0)
Monocytes Absolute: 0.4 10*3/uL (ref 0.1–1.0)
Neutrophils Relative %: 57 % (ref 43.0–77.0)
Platelets: 187 10*3/uL (ref 150.0–400.0)
RBC: 4.61 Mil/uL (ref 4.22–5.81)
RDW: 13.4 % (ref 11.5–14.6)

## 2011-05-15 LAB — BASIC METABOLIC PANEL
BUN: 23 mg/dL (ref 6–23)
CO2: 29 mEq/L (ref 19–32)
Calcium: 9.3 mg/dL (ref 8.4–10.5)
Chloride: 107 mEq/L (ref 96–112)
Creatinine, Ser: 1 mg/dL (ref 0.4–1.5)
Glucose, Bld: 91 mg/dL (ref 70–99)

## 2011-05-15 LAB — POCT URINALYSIS DIPSTICK
Blood, UA: NEGATIVE
Protein, UA: NEGATIVE
Spec Grav, UA: 1.025
Urobilinogen, UA: 0.2

## 2011-05-15 LAB — LIPID PANEL
HDL: 34.2 mg/dL — ABNORMAL LOW (ref >39.00)
Total CHOL/HDL Ratio: 4
Triglycerides: 189 mg/dL — ABNORMAL HIGH (ref 0.0–149.0)
VLDL: 37.8 mg/dL (ref 0.0–40.0)

## 2011-05-15 LAB — HEPATIC FUNCTION PANEL
Alkaline Phosphatase: 44 U/L (ref 39–117)
Bilirubin, Direct: 0.1 mg/dL (ref 0.0–0.3)
Total Bilirubin: 0.4 mg/dL (ref 0.3–1.2)
Total Protein: 6.2 g/dL (ref 6.0–8.3)

## 2011-05-15 LAB — PSA: PSA: 0.81 ng/mL (ref 0.10–4.00)

## 2011-05-16 NOTE — Procedures (Signed)
[place]  Patient:    Darrell Gaines, Darrell Gaines                          MRN: [id number] Proc. Date: 08/11/00 Adm. Date:  [id number] Attending:  Lesly Dukes CC:         [facility]  Marlan Palau, M.D.   Procedure Report  PROCEDURE:  Transesophageal echocardiography.  CARDIOLOGIST:  Noralyn Pick. Eden Emms, M.D.  INDICATION:  TIA.  DESCRIPTION OF PROCEDURE:  Patient was sedated with Versed 7 mg.  Using digital technique, an Omniplane probe was advanced into the distal esophagus without incident.  Transthoracic imaging revealed normal LV function with an EF of 60%.  There was no mural clot.  Imaging in the distal esophagus revealed normal cardiac chamber sizes, all four valves were normal and there was no evidence of SBE or vegetation.  Imaging of the atria showed no spontaneous contrast and no left atrial appendage thrombus.  The atrial septum was intact by 2-D and color flow.  Bubble study was negative for right-to-left shunting.  Imaging of the aorta showed no significant debris.  FINAL IMPRESSION:  Normal transesophageal echocardiography with no source of embolus. DD:  08/11/00 TD:  08/11/00 Job: [id number]

## 2011-05-16 NOTE — Discharge Summary (Signed)
[place]  Patient:    Darrell Gaines, Darrell Gaines                          MRN: [id number] Adm. Date:  [id number] Disc. Date: [id number] Attending:  Lesly Dukes CC:         [facility] [address] Cords H. Swords, M.D. [facility]   Discharge Summary  ADMISSION DIAGNOSES: 1. New onset hemiataxic syndrome. 2. Palpitation of the heart. 3. History of anxiety disorder.  DISCHARGE DIAGNOSES: 1. History of left brain stroke involving the posterior limb internal    capsule, etiology unclear. 2. History of anxiety disorder.  PROCEDURES DURING THIS ADMISSION: 1. CT of the brain. 2. MRI of the brain. 3. MRI angiogram. 4. Cerebral angiogram. 5. A 2D echocardiogram. 6. Carotid Doppler study. 7. Transesophageal echocardiogram.  COMPLICATIONS OF ABOVE PROCEDURES:  None.  HISTORY OF PRESENT ILLNESS:  Darrell Gaines is a 55 year old left-handed white male born [birth year], with a history of palpitations of the heart.  The patient is followed by Valetta Mole. Swords, M.D.  The patient was seen in [facility] Emergency Room on the day of admission, presenting with a right-sided hemiataxia syndrome.  The patient noted sudden onset of the problem with slurred speech.  He denied any double vision, loss of vision, blurred vision. The patient had no numbness on the right side of the body but did note he had difficulty walking, tending to veer off to the right when he tried to walk and there was clumsiness of the right hand.  The patient was still able to ambulate; however, but was unsteady.  The patient denied any vertigo or headache, nausea or vomiting.  The patient came to the emergency room for an evaluation.  CT scan of the brain was unremarkable.  PAST MEDICAL HISTORY: 1. History of right hemiataxic syndrome, new onset, rule out stroke. 2. History of palpitation of the heart. 3. History of polyp resection as a child involving the colon. 4.  History of anxiety disorder.  He has been on Paxil.  No longer on    medication at the time of admission.  MEDICATIONS ON ADMISSION:  Pepcid AC for the stomach if needed.  ALLERGIES:  No known drug allergies.  SOCIAL HISTORY:  He does not smoke or drink.  Please refer to history and physical dictation summary for the remainder of social history, family history, review of systems, and physical examination.  LABORATORY VALUES:  White count 6.9, hemoglobin 13.0, hematocrit 35.0, platelets 181.  Coagulation studies were normal on admission.  Cholesterol 169.  Drug screen negative.  ANA negative.  Hypercoagulable state work-up was drawn at [facility] and the reports are pending at this time.  HOSPITAL COURSE:  The patient did well during the course of hospitalization. The patient was admitted for further work-up, was transferred from [facility] to [facility] to the stroke service.  The patient underwent a 2D echocardiogram that was normal.  Left ventricular systolic function was the lower limits of normal.  Ejection fraction 50 to 55%.  no left ventricular wall motion abnormalities were seen.  No source of cardiac embolism was noted.  The transesophageal echocardiogram was also performed and was unremarkable.  The patient had a carotid Doppler study that was normal, antegrade vertebral artery flow was noted.  EKG reveals normal sinus rhythm, nonspecific T-wave abnormalities were noted, otherwise unremarkable.  Heart  rate was 72.  Chest x-ray was negative with no acute disease seen.  CT of the head was unremarkable.  MRI of the brain, however, showed a cluster of strokes on the left and an acute stroke on the limb of the internal capsule, some slightly older strokes in the upper aspect of the basal gangliar region, corona radiata.  MRI angiogram was unremarkable.  The patient had been treated with heparin during the admission.  The cerebral  angiogram was done on August 10, 2000.  Angiogram was normal.  No evidence of vasculitis was seen.  Following the above work-up, the patient was placed on aspirin and Plavix, taken off of heparin.  The patient had minimal deficit which did not require physical therapy or occupational therapy following discharge.  The hypercoagulable state work-up again was drawn but is pending and includes an RPR, antiphospholipid antibody panel, antithrombin III level, sed rate, protein C, protein S level, homocystine level.  At the time of discharge, the patient is bright, alert, cooperative and fully ambulatory.  The patient is to not engage in any heavy strenuous activity for six weeks following discharge. The patient again will follow up with [facility] in two to three weeks following discharge.  DISCHARGE MEDICATIONS: 1. Aspirin one a day. 2. Plavix 75 mg a day. DD:  09/09/00 TD:  09/10/00 Job: [id number]

## 2011-05-16 NOTE — H&P (Signed)
[place]  Patient:    Darrell, Gaines                          MRN: [id number] Adm. Date:  [id number] Attending:  Benny Lennert CC:         [facility]  Valetta Mole. Swords, M.D. [facility]   History and Physical  HISTORY OF PRESENT ILLNESS:  Darrell Gaines is a 55 year old left-handed white male born 07-16-1956, with a history of palpitations.  This patient is followed by Dr. Birdie Sons.  Comes to the [facility] Emergency Room today for an evaluation of a right-sided hemiataxia syndrome that began at 8 p.m. on the day prior to admission.  The patient noted sudden onset of the problem associated with some slurred speech.  The patient denied any double vision, loss of vision, blurred vision.  The patient denied any numbness or true weakness on the right side of the body.  The patient, however, did note that he had difficulty with walking, would tend to veer off to the right, and had some clumsiness to the right hand, noted that he would spill a drink off the table when he tried to reach and grab the glass.  The patient was still ambulatory, would veer off and hit the walls when he tried to walk, but did not fall.  The patient had no true vertigo, no nausea, vomiting, or headache. This patient did not seek medical attention last evening, waited overnight, and found that the problem was still there to some degree this morning.  The patient comes to the emergency room today for an evaluation.  The patient is somewhat better, today, however, with improvement in the ataxia and slurred speech.  PAST MEDICAL HISTORY: 1. New onset of right hemiataxia syndrome. 2. History of palpitations of the heart.  In the past, the patient had been    on Lopressor but is off this medication currently. 3. History of polyp resection as a child of the colon. 4. History of anxiety disorder.  The patient had been on Paxil, is no longer    on this  medication.  MEDICATIONS:  The patient has been taking some Pepcid AC for stomach.  ALLERGIES:  The patient has no known allergies.  SOCIAL HISTORY:  Does not smoke or drink.  SOCIAL HISTORY:  The patient works as an Programmer, multimedia.  He lives in the [place] area.  Is married, has two children, both alive and well.  FAMILY HISTORY:  Notable in that father died with MI.  Mother still alive, has Parkinsons disease.  The patient has one sister, who is alive and well.  REVIEW OF SYSTEMS:  Notable for no fevers, chills.  The patient denies shortness of breath, chest pain, palpitations recently.  The patient denies any nausea, vomiting, problems controlling the bladder.  Denies any blackout episodes, vertigo, confusion.  PHYSICAL EXAMINATION:  VITAL SIGNS:  Blood pressure is 169/88, heart rate 71, respiratory rate 20, temperature afebrile.  GENERAL:  This patient is a well-developed white male who is alert and cooperative at the time of the examination.  HEENT:  Head is atraumatic.  Eyes:  Pupils are equal, round and reactive to light.  Discs are flat bilaterally.  Extraocular movements are full, minimal end-gaze nystagmus is seen bilaterally.  Tongue is midline.  NECK:  Supple.  No carotid bruits.  RESPIRATORY:  Clear to auscultation and percussion.  CARDIOVASCULAR:  Regular rate  and rhythm without obvious murmurs or rubs.  ABDOMEN:  Positive bowel sounds, no organomegaly or tenderness noted.  EXTREMITIES:  Without significant edema.  NEUROLOGIC:  Cranial nerves as above.  Facial symmetry is present.  The patient has good sensation in the face to pinprick and soft touch bilaterally. The patient has good strength of the facial muscles and the muscles of head turning and shoulder shrug bilaterally.  The patient protrudes the tongue in the midline.  Speech is fairly well-enunciated at this time.  Pinprick and soft touch sensation to the face again is symmetric and normal.  Motor  testing is normal on all four extremities, good symmetric motor and tone is noted throughout.  Sensory testing is intact to pinprick, soft touch, vibratory sensation throughout.  The patient has good finger-nose-finger, toe-to-finger bilaterally.  Deep tendon reflexes are symmetric and normal.  Toes are downgoing bilaterally.  With ambulation, the patient is able to ambulate fairly normally.  With tandem gait, there is a slight tendency to go off to the right.  Romberg, however, is negative.  No pronator drift is seen.  LABORATORY DATA:  A CT scan of the brain was done and is unremarkable.  EKG reveals normal sinus rhythm, normal EKG, heart rate of 72.  Blood work is notable for a white count of 5.8, hemoglobin 14.4, hematocrit 40.1, MCV 84.7, platelets of 221.  Sodium 137, potassium 4.1, and chloride of 104, CO2 28, glucose 109, BUN 19, creatinine 1.1, total bilirubin 0.7, alkaline phosphatase 48, SGOT of 24, SGPT of 25, total protein 6.9, albumin 4.4, calcium 9.3.  Chest x-ray is pending at this time.  IMPRESSION:  New onset of right hemiataxia syndrome, rule out transient ischemic attack versus reversible ischemic neurologic deficit (RIND).  This patients clinical examination at this point is completely normal at this time with the exception of some minimal instability with tandem gait, tends to go to the right.  The patient has pretty much resolved his neurologic deficits beginning yesterday.  It is not clear as to the cause of this problem.  The patient has no prior history of migraine, had no headache with this event.  We will pursue a stroke workup in this case and rule out hypercoagulable state.  PLAN: 1. Admission to [facility] Stroke Service monitored bed, 3000 unit. 2. MRI of the brain. 3. MRI angiogram of the intracranial vessels. 4. Carotid Doppler study. 5. 2D echocardiogram. 6. IV heparin for now, no bolus. 7. IV fluids, 100 cc normal saline an hour.  8.  Hypercoagulable state workup.  May consider a cerebral angiogram or a TEE    in the future during this hospitalization, depending on the results of the    above studies.  The patient otherwise is fairly healthy, very active, runs on a regular basis. Has no obvious risk factors for stroke. DD:  08/07/00 TD:  08/07/00 Job: [id number]

## 2011-05-22 ENCOUNTER — Encounter: Payer: Self-pay | Admitting: Internal Medicine

## 2011-05-22 ENCOUNTER — Ambulatory Visit (INDEPENDENT_AMBULATORY_CARE_PROVIDER_SITE_OTHER): Payer: PRIVATE HEALTH INSURANCE | Admitting: Internal Medicine

## 2011-05-22 VITALS — BP 142/74 | HR 76 | Temp 98.9°F | Ht 71.0 in | Wt 186.0 lb

## 2011-05-22 DIAGNOSIS — G4700 Insomnia, unspecified: Secondary | ICD-10-CM

## 2011-05-22 MED ORDER — ZALEPLON 5 MG PO CAPS: 5.0000 mg | ORAL_CAPSULE | Freq: Every evening | ORAL | Status: AC | PRN

## 2011-05-22 NOTE — Progress Notes (Signed)
  Subjective:    Patient ID: Darrell Gaines, male    DOB: Oct 09, 1956, 54 y.o.   MRN: [id number]  HPI  cpx  Past Medical History  Diagnosis Date  . GERD (gastroesophageal reflux disease)   . Hyperlipidemia   . History of cerebrovascular accident   . Depression (disease)   . Hypertension   . Hemorrhoids   . Anxiety   . Rectal polyp    No past surgical history on file.  reports that he has never smoked. He does not have any smokeless tobacco history on file. He reports that he does not drink alcohol or use illicit drugs. family history includes Coronary artery disease in his father; Heart disease in his maternal grandfather; and Parkinsonism in his mother. No Known Allergies   Review of Systems  patient denies chest pain, shortness of breath, orthopnea. Denies lower extremity edema, abdominal pain, change in appetite, change in bowel movements. Patient denies rashes, musculoskeletal complaints. No other specific complaints in a complete review of systems. Will have 2/7 nights of insomnia.     Objective:   Physical Exam Well-developed male in no acute distress. HEENT exam atraumatic, normocephalic, extraocular muscles are intact. Conjunctivae are pink without exudate. Neck is supple without lymphadenopathy, thyromegaly, jugular venous distention. Chest is clear to auscultation without increased work of breathing. Cardiac exam S1-S2 are regular. The PMI is normal. No significant murmurs or gallops. Abdominal exam active bowel sounds, soft, nontender. No abdominal bruits. Extremities no clubbing cyanosis or edema. Peripheral pulses are normal without bruits. Neurologic exam alert and oriented without any motor or sensory deficits. Rectal exam normal tone prostate normal size without masses or asymmetry.      Assessment & Plan:  Well visit Health maint UTD --lipids and bp ok Continue secondary prevention.

## 2011-06-03 ENCOUNTER — Telehealth: Payer: Self-pay | Admitting: *Deleted

## 2011-06-03 NOTE — Telephone Encounter (Signed)
Wants to change to generic plavix-is it ok?  [facility]

## 2011-06-04 NOTE — Telephone Encounter (Signed)
Ok  i'm not sure they make it

## 2011-06-04 NOTE — Telephone Encounter (Signed)
Ok to change to plavix per dr swords

## 2011-07-26 ENCOUNTER — Other Ambulatory Visit: Payer: Self-pay | Admitting: Gastroenterology

## 2011-07-28 NOTE — Telephone Encounter (Signed)
NEEDS OFFICE VISIT FOR ANY FURTHER REFILLS! 

## 2011-07-29 ENCOUNTER — Other Ambulatory Visit: Payer: Self-pay | Admitting: Gastroenterology

## 2011-07-29 NOTE — Telephone Encounter (Signed)
Left a message for patient to return my call. 

## 2011-07-30 NOTE — Telephone Encounter (Signed)
Error

## 2011-07-30 NOTE — Telephone Encounter (Signed)
Spoke with wife and she states he scheduled a office visit already. I told her that I sent him a refill on 07/26/11 and if the pharmacy did not receive to call me back.

## 2011-08-20 ENCOUNTER — Encounter: Payer: Self-pay | Admitting: Gastroenterology

## 2011-08-20 ENCOUNTER — Ambulatory Visit (INDEPENDENT_AMBULATORY_CARE_PROVIDER_SITE_OTHER): Payer: PRIVATE HEALTH INSURANCE | Admitting: Gastroenterology

## 2011-08-20 VITALS — BP 140/86 | HR 60 | Ht 71.0 in | Wt 189.4 lb

## 2011-08-20 DIAGNOSIS — K219 Gastro-esophageal reflux disease without esophagitis: Secondary | ICD-10-CM

## 2011-08-20 DIAGNOSIS — Z86010 Personal history of colonic polyps: Secondary | ICD-10-CM

## 2011-08-20 MED ORDER — PANTOPRAZOLE SODIUM 40 MG PO TBEC: 40.0000 mg | DELAYED_RELEASE_TABLET | Freq: Every day | ORAL | Status: DC

## 2011-08-20 NOTE — Progress Notes (Signed)
History of Present Illness: This is a 55 year old male returning for followup of GERD. His reflux symptoms are under very good control on daily pantoprazole. He relates occasional generalized abdominal soreness since beginning a new exercise routine. Since holding off on his exercise routine his abdominal soreness has resolved. There is no relation to meals or bowel movements. Denies weight loss, constipation, diarrhea, change in stool caliber, melena, hematochezia, nausea, vomiting, dysphagia, chest pain.  Current Medications, Allergies, Past Medical History, Past Surgical History, Family History and Social History were reviewed in [employer] record.  Physical Exam: General: Well developed , well nourished, no acute distress Head: Normocephalic and atraumatic Eyes:  sclerae anicteric, EOMI Ears: Normal auditory acuity Mouth: No deformity or lesions Lungs: Clear throughout to auscultation Heart: Regular rate and rhythm; no murmurs, rubs or bruits Abdomen: Soft, non tender and non distended. No masses, hepatosplenomegaly or hernias noted. Normal Bowel sounds Musculoskeletal: Symmetrical with no gross deformities  Pulses:  Normal pulses noted Extremities: No clubbing, cyanosis, edema or deformities noted Neurological: Alert oriented x 4, grossly nonfocal Psychological:  Alert and cooperative. Normal mood and affect  Assessment and Recommendations:  1. GERD history of erosive esophagitis. Continue standard antireflux measures and pantoprazole 40 mg daily.  2. Personal history of adenomatous colon polyps. Surveillance colonoscopy recommended August 2016.

## 2011-08-20 NOTE — Patient Instructions (Addendum)
Your Protonix has been refilled to your pharmacy for the next year.  cc: Birdie Sons, MD

## 2011-09-16 ENCOUNTER — Ambulatory Visit (INDEPENDENT_AMBULATORY_CARE_PROVIDER_SITE_OTHER): Payer: PRIVATE HEALTH INSURANCE | Admitting: Internal Medicine

## 2011-09-16 DIAGNOSIS — F329 Major depressive disorder, single episode, unspecified: Secondary | ICD-10-CM

## 2011-09-16 MED ORDER — PAROXETINE HCL 20 MG PO TABS: 20.0000 mg | ORAL_TABLET | Freq: Every day | ORAL | Status: DC

## 2011-09-24 NOTE — Progress Notes (Signed)
  Subjective:    Patient ID: Darrell Gaines, male    DOB: 07-11-1956, 55 y.o.   MRN: [id number]  HPI Pt with hx of depression, now off meds Has had recurrent sxs: poor sleep, depressed mood. No suicidal thoughts  Past Medical History  Diagnosis Date  . GERD (gastroesophageal reflux disease)   . Hyperlipidemia   . History of cerebrovascular accident   . Depression (disease)   . Hypertension   . Hemorrhoids   . Anxiety   . Erosive esophagitis   . Tubular adenoma polyp of rectum 07/2010   No past surgical history on file.  reports that he has never smoked. He has never used smokeless tobacco. He reports that he does not drink alcohol or use illicit drugs. family history includes Coronary artery disease in his father; Heart disease in his maternal grandfather; and Parkinsonism in his mother. No Known Allergies    Review of Systems  patient denies chest pain, shortness of breath, orthopnea. Denies lower extremity edema, abdominal pain, change in appetite, change in bowel movements. Patient denies rashes, musculoskeletal complaints. No other specific complaints in a complete review of systems.      Objective:   Physical Exam  well-developed well-nourished male in no acute distress. HEENT exam atraumatic, normocephalic, neck supple without jugular venous distention. Chest clear to auscultation cardiac exam S1-S2 are regular. Abdominal exam overweight with bowel sounds, soft and nontender. Extremities no edema. Neurologic exam is alert with a normal gait.     Assessment & Plan:

## 2011-09-24 NOTE — Assessment & Plan Note (Signed)
Discussed at length Will resume paxil Side effects discussed Call me in one month if not back to baseline

## 2011-11-17 ENCOUNTER — Other Ambulatory Visit (INDEPENDENT_AMBULATORY_CARE_PROVIDER_SITE_OTHER): Payer: PRIVATE HEALTH INSURANCE

## 2011-11-17 DIAGNOSIS — G4700 Insomnia, unspecified: Secondary | ICD-10-CM

## 2011-11-17 LAB — LIPID PANEL
HDL: 40.9 mg/dL (ref >39.00)
Total CHOL/HDL Ratio: 4
Triglycerides: 141 mg/dL (ref 0.0–149.0)
VLDL: 28.2 mg/dL (ref 0.0–40.0)

## 2011-11-17 LAB — HEPATIC FUNCTION PANEL
ALT: 45 U/L (ref 0–53)
Alkaline Phosphatase: 54 U/L (ref 39–117)
Bilirubin, Direct: 0.1 mg/dL (ref 0.0–0.3)
Total Bilirubin: 0.6 mg/dL (ref 0.3–1.2)
Total Protein: 7.3 g/dL (ref 6.0–8.3)

## 2011-11-24 ENCOUNTER — Ambulatory Visit: Payer: PRIVATE HEALTH INSURANCE | Admitting: Internal Medicine

## 2012-01-05 ENCOUNTER — Other Ambulatory Visit: Payer: Self-pay | Admitting: Internal Medicine

## 2012-01-22 ENCOUNTER — Other Ambulatory Visit: Payer: Self-pay | Admitting: Internal Medicine

## 2012-05-14 ENCOUNTER — Other Ambulatory Visit: Payer: PRIVATE HEALTH INSURANCE

## 2012-05-21 ENCOUNTER — Ambulatory Visit: Payer: PRIVATE HEALTH INSURANCE | Admitting: Internal Medicine

## 2012-05-28 ENCOUNTER — Ambulatory Visit (INDEPENDENT_AMBULATORY_CARE_PROVIDER_SITE_OTHER): Payer: PRIVATE HEALTH INSURANCE | Admitting: Internal Medicine

## 2012-05-28 ENCOUNTER — Encounter: Payer: Self-pay | Admitting: Internal Medicine

## 2012-05-28 VITALS — BP 118/68 | HR 76 | Temp 98.6°F | Wt 198.0 lb

## 2012-05-28 DIAGNOSIS — Z8679 Personal history of other diseases of the circulatory system: Secondary | ICD-10-CM

## 2012-05-28 DIAGNOSIS — F329 Major depressive disorder, single episode, unspecified: Secondary | ICD-10-CM

## 2012-05-28 DIAGNOSIS — F3289 Other specified depressive episodes: Secondary | ICD-10-CM

## 2012-05-28 DIAGNOSIS — K219 Gastro-esophageal reflux disease without esophagitis: Secondary | ICD-10-CM

## 2012-05-28 NOTE — Progress Notes (Signed)
Patient ID: Darrell Gaines, male   DOB: December 01, 1956, 56 y.o.   MRN: [id number] Feels well no complaints Patient with history of stroke. No recurrence.  Patient with hyperlipidemia. Tolerating medications.  Patient with history of gastroesophageal reflux disease on PPI. No symptoms.  Patient with insomnia symptoms are much better.  Patient with history of depression symptoms are well-controlled on current medications.  Past Medical History  Diagnosis Date  . GERD (gastroesophageal reflux disease)   . Hyperlipidemia   . History of cerebrovascular accident   . Depression (disease)   . Hypertension   . Hemorrhoids   . Anxiety   . Erosive esophagitis   . Tubular adenoma polyp of rectum 07/2010   No past surgical history on file.  reports that he has never smoked. He has never used smokeless tobacco. He reports that he does not drink alcohol or use illicit drugs. family history includes Coronary artery disease in his father; Heart disease in his maternal grandfather; and Parkinsonism in his mother. No Known Allergies    patient denies chest pain, shortness of breath, orthopnea. Denies lower extremity edema, abdominal pain, change in appetite, change in bowel movements. Patient denies rashes, musculoskeletal complaints. No other specific complaints in a complete review of systems.    Well-developed male in no acute distress. Neck is supple. Chest clear auscultation cardiac exam S1-S2 are regular. Abdominal exam active bowel sounds, soft, nontender.

## 2012-05-31 NOTE — Assessment & Plan Note (Signed)
Discussed medications. I'll leave the medication on the list. He will consider whether he wants to taper the dose. If he does taper he was instructed on a tapering dose. He'll start with a half tablet daily for one month and then a half tablet every other day for one month and then discontinue if he decides to do that.

## 2012-05-31 NOTE — Assessment & Plan Note (Signed)
We'll continue aggressive risk factor modification. Patient's not had any recurrence of symptoms.

## 2012-05-31 NOTE — Assessment & Plan Note (Signed)
No symptoms on current medications. Continue Protonix.

## 2012-08-10 ENCOUNTER — Other Ambulatory Visit: Payer: Self-pay | Admitting: Internal Medicine

## 2012-08-18 ENCOUNTER — Other Ambulatory Visit: Payer: Self-pay | Admitting: Gastroenterology

## 2012-08-18 NOTE — Telephone Encounter (Signed)
NEEDS OFFICE VISIT FOR ANY FURTHER REFILLS! 

## 2012-08-27 ENCOUNTER — Telehealth: Payer: Self-pay | Admitting: Gastroenterology

## 2012-08-27 MED ORDER — PANTOPRAZOLE SODIUM 40 MG PO TBEC: 40.0000 mg | DELAYED_RELEASE_TABLET | Freq: Every day | ORAL | Status: DC

## 2012-08-27 NOTE — Telephone Encounter (Signed)
Prescription sent to patient's pharmacy and pt scheduled for yearly GERD f/u on 09/15/12. Pt agreed and will come for his appt for any further refills.

## 2012-09-09 ENCOUNTER — Other Ambulatory Visit: Payer: Self-pay | Admitting: Internal Medicine

## 2012-09-15 ENCOUNTER — Ambulatory Visit (INDEPENDENT_AMBULATORY_CARE_PROVIDER_SITE_OTHER): Payer: Commercial Managed Care - PPO | Admitting: Gastroenterology

## 2012-09-15 ENCOUNTER — Encounter: Payer: Self-pay | Admitting: Gastroenterology

## 2012-09-15 VITALS — BP 130/78 | HR 62 | Ht 71.0 in | Wt 197.4 lb

## 2012-09-15 DIAGNOSIS — Z860100 Personal history of colon polyps, unspecified: Secondary | ICD-10-CM

## 2012-09-15 DIAGNOSIS — K219 Gastro-esophageal reflux disease without esophagitis: Secondary | ICD-10-CM

## 2012-09-15 MED ORDER — PANTOPRAZOLE SODIUM 40 MG PO TBEC: 40.0000 mg | DELAYED_RELEASE_TABLET | Freq: Every day | ORAL | Status: DC

## 2012-09-15 NOTE — Progress Notes (Signed)
History of Present Illness: This is a 56 year old male with chronic GERD. Symptoms well controlled on pantoprazole. He has rare episodes of break through symptoms. Denies weight loss, abdominal pain, constipation, diarrhea, change in stool caliber, melena, hematochezia, nausea, vomiting, dysphagia, chest pain.  Current Medications, Allergies, Past Medical History, Past Surgical History, Family History and Social History were reviewed in [employer] record.  Physical Exam: General: Well developed , well nourished, no acute distress Head: Normocephalic and atraumatic Eyes:  sclerae anicteric, EOMI Ears: Normal auditory acuity Mouth: No deformity or lesions Lungs: Clear throughout to auscultation Heart: Regular rate and rhythm; no murmurs, rubs or bruits Abdomen: Soft, non tender and non distended. No masses, hepatosplenomegaly or hernias noted. Normal Bowel sounds Musculoskeletal: Symmetrical with no gross deformities  Pulses:  Normal pulses noted Extremities: No clubbing, cyanosis, edema or deformities noted Neurological: Alert oriented x 4, grossly nonfocal Psychological:  Alert and cooperative. Normal mood and affect  Assessment and Recommendations:  1. GERD history of erosive esophagitis. Continue standard antireflux measures and pantoprazole 40 mg daily.   2. Personal history of adenomatous colon polyps. Surveillance colonoscopy recommended August 2016.

## 2012-09-15 NOTE — Patient Instructions (Addendum)
We have sent the following medications to your pharmacy for you to pick up at your convenience: Pantoprazole.  cc: Birdie Sons, MD

## 2012-11-19 ENCOUNTER — Other Ambulatory Visit (INDEPENDENT_AMBULATORY_CARE_PROVIDER_SITE_OTHER): Payer: Commercial Managed Care - PPO

## 2012-11-19 DIAGNOSIS — Z0000 Encounter for general adult medical examination without abnormal findings: Secondary | ICD-10-CM

## 2012-11-19 LAB — POCT URINALYSIS DIPSTICK
Bilirubin, UA: NEGATIVE
Ketones, UA: NEGATIVE
Leukocytes, UA: NEGATIVE
Protein, UA: NEGATIVE
Spec Grav, UA: 1.02
pH, UA: 6.5

## 2012-11-19 LAB — HEPATIC FUNCTION PANEL
ALT: 38 U/L (ref 0–53)
Bilirubin, Direct: 0.1 mg/dL (ref 0.0–0.3)
Total Bilirubin: 0.6 mg/dL (ref 0.3–1.2)

## 2012-11-19 LAB — CBC WITH DIFFERENTIAL/PLATELET
Basophils Absolute: 0 10*3/uL (ref 0.0–0.1)
Eosinophils Absolute: 0.1 10*3/uL (ref 0.0–0.7)
Eosinophils Relative: 1.2 % (ref 0.0–5.0)
HCT: 40.2 % (ref 39.0–52.0)
Lymphs Abs: 1.8 10*3/uL (ref 0.7–4.0)
MCV: 90.6 fl (ref 78.0–100.0)
Monocytes Absolute: 0.5 10*3/uL (ref 0.1–1.0)
Neutrophils Relative %: 54.9 % (ref 43.0–77.0)
Platelets: 191 10*3/uL (ref 150.0–400.0)
RDW: 13.3 % (ref 11.5–14.6)
WBC: 5.3 10*3/uL (ref 4.5–10.5)

## 2012-11-19 LAB — LIPID PANEL
HDL: 30.6 mg/dL — ABNORMAL LOW (ref >39.00)
Total CHOL/HDL Ratio: 6
VLDL: 58 mg/dL — ABNORMAL HIGH (ref 0.0–40.0)

## 2012-11-19 LAB — BASIC METABOLIC PANEL
BUN: 24 mg/dL — ABNORMAL HIGH (ref 6–23)
Chloride: 103 mEq/L (ref 96–112)
Creatinine, Ser: 1.1 mg/dL (ref 0.4–1.5)
Glucose, Bld: 109 mg/dL — ABNORMAL HIGH (ref 70–99)

## 2012-11-29 ENCOUNTER — Encounter: Payer: Self-pay | Admitting: Internal Medicine

## 2012-11-29 ENCOUNTER — Ambulatory Visit (INDEPENDENT_AMBULATORY_CARE_PROVIDER_SITE_OTHER): Payer: Commercial Managed Care - PPO | Admitting: Internal Medicine

## 2012-11-29 VITALS — BP 126/82 | HR 66 | Temp 98.0°F | Ht 72.0 in | Wt 199.0 lb

## 2012-11-29 DIAGNOSIS — Z0000 Encounter for general adult medical examination without abnormal findings: Secondary | ICD-10-CM

## 2012-11-29 DIAGNOSIS — E785 Hyperlipidemia, unspecified: Secondary | ICD-10-CM

## 2012-11-29 DIAGNOSIS — R7309 Other abnormal glucose: Secondary | ICD-10-CM

## 2012-11-29 DIAGNOSIS — R739 Hyperglycemia, unspecified: Secondary | ICD-10-CM

## 2012-11-29 NOTE — Progress Notes (Signed)
Patient ID: Darrell Gaines, male   DOB: Aug 04, 1956, 56 y.o.   MRN: [id number]   cpx  Past Medical History  Diagnosis Date  . GERD (gastroesophageal reflux disease)   . Hyperlipidemia   . History of cerebrovascular accident   . Depression (disease)   . Hypertension   . Hemorrhoids   . Anxiety   . Erosive esophagitis   . Tubular adenoma polyp of rectum 07/2010    History   Social History  . Marital Status: Married    Spouse Name: N/A    Number of Children: 2  . Years of Education: N/A   Occupational History  . [employer]    Social History Main Topics  . Smoking status: Never Smoker   . Smokeless tobacco: Never Used  . Alcohol Use: No  . Drug Use: No  . Sexually Active: Not on file   Other Topics Concern  . Not on file   Social History Narrative   Cell [phone] release on 05-20-2010    Past Surgical History  Procedure Date  . Colon polyp removal 1967    Family History  Problem Relation Age of Onset  . Parkinsonism Mother   . Coronary artery disease Father   . Heart disease Maternal Grandfather     No Known Allergies  Current Outpatient Prescriptions on File Prior to Visit  Medication Sig Dispense Refill  . aspirin 325 MG tablet Take 325 mg by mouth daily.        Marland Kitchen atorvastatin (LIPITOR) 40 MG tablet take 1 tablet by mouth once daily  30 tablet  11  . clopidogrel (PLAVIX) 75 MG tablet take 1 tablet by mouth once daily  30 tablet  6  . fish oil-omega-3 fatty acids 1000 MG capsule Take 3 g by mouth daily.        . Melatonin 5 MG TABS Take 5 mg by mouth as needed.       . niacin 500 MG tablet Take 500 mg by mouth. 2 po at bedtime        . pantoprazole (PROTONIX) 40 MG tablet Take 1 tablet (40 mg total) by mouth daily.  30 tablet  11  . PARoxetine (PAXIL) 20 MG tablet take 1 tablet by mouth once daily  30 tablet  11     patient denies chest pain, shortness of breath, orthopnea. Denies lower extremity edema, abdominal pain, change in  appetite, change in bowel movements. Patient denies rashes, musculoskeletal complaints. No other specific complaints in a complete review of systems.   BP 126/82  Pulse 66  Temp 98 F (36.7 C) (Oral)  Ht 6' (1.829 m)  Wt 199 lb (90.266 kg)  BMI 26.99 kg/m2 Well-developed male in no acute distress. HEENT exam atraumatic, normocephalic, extraocular muscles are intact. Conjunctivae are pink without exudate. Neck is supple without lymphadenopathy, thyromegaly, jugular venous distention. Chest is clear to auscultation without increased work of breathing. Cardiac exam S1-S2 are regular. The PMI is normal. No significant murmurs or gallops. Abdominal exam active bowel sounds, soft, nontender. No abdominal bruits. Extremities no clubbing cyanosis or edema. Peripheral pulses are normal without bruits. Neurologic exam alert and oriented without any motor or sensory deficits.  A/p- well visit- health maint UTD

## 2013-01-06 ENCOUNTER — Other Ambulatory Visit: Payer: Self-pay | Admitting: Internal Medicine

## 2013-03-02 ENCOUNTER — Other Ambulatory Visit: Payer: Self-pay | Admitting: Internal Medicine

## 2013-03-31 ENCOUNTER — Other Ambulatory Visit (INDEPENDENT_AMBULATORY_CARE_PROVIDER_SITE_OTHER): Payer: Commercial Managed Care - PPO

## 2013-03-31 ENCOUNTER — Encounter: Payer: Self-pay | Admitting: Internal Medicine

## 2013-03-31 DIAGNOSIS — R7309 Other abnormal glucose: Secondary | ICD-10-CM

## 2013-03-31 DIAGNOSIS — R739 Hyperglycemia, unspecified: Secondary | ICD-10-CM

## 2013-03-31 DIAGNOSIS — E785 Hyperlipidemia, unspecified: Secondary | ICD-10-CM

## 2013-03-31 LAB — BASIC METABOLIC PANEL
BUN: 19 mg/dL (ref 6–23)
Calcium: 8.7 mg/dL (ref 8.4–10.5)
Chloride: 101 mEq/L (ref 96–112)
Creatinine, Ser: 1.3 mg/dL (ref 0.4–1.5)
GFR: 63.06 mL/min (ref >60.00)

## 2013-03-31 LAB — LIPID PANEL
Cholesterol: 110 mg/dL (ref 0–200)
LDL Cholesterol: 50 mg/dL (ref 0–99)
Total CHOL/HDL Ratio: 3
Triglycerides: 131 mg/dL (ref 0.0–149.0)

## 2013-08-26 ENCOUNTER — Other Ambulatory Visit: Payer: Self-pay | Admitting: Internal Medicine

## 2013-09-24 ENCOUNTER — Other Ambulatory Visit: Payer: Self-pay | Admitting: Internal Medicine

## 2013-09-26 ENCOUNTER — Other Ambulatory Visit: Payer: Self-pay

## 2013-09-26 MED ORDER — PANTOPRAZOLE SODIUM 40 MG PO TBEC: 40.0000 mg | DELAYED_RELEASE_TABLET | Freq: Every day | ORAL | Status: DC

## 2013-10-13 ENCOUNTER — Ambulatory Visit (INDEPENDENT_AMBULATORY_CARE_PROVIDER_SITE_OTHER): Payer: Commercial Managed Care - PPO | Admitting: Gastroenterology

## 2013-10-13 ENCOUNTER — Encounter: Payer: Self-pay | Admitting: Gastroenterology

## 2013-10-13 VITALS — BP 130/70 | HR 60 | Ht 71.0 in | Wt 179.4 lb

## 2013-10-13 DIAGNOSIS — Z86010 Personal history of colonic polyps: Secondary | ICD-10-CM

## 2013-10-13 DIAGNOSIS — K219 Gastro-esophageal reflux disease without esophagitis: Secondary | ICD-10-CM

## 2013-10-13 MED ORDER — PANTOPRAZOLE SODIUM 40 MG PO TBEC: 40.0000 mg | DELAYED_RELEASE_TABLET | Freq: Every day | ORAL | Status: DC

## 2013-10-13 NOTE — Patient Instructions (Signed)
We have sent the following medications to your pharmacy for you to pick up at your convenience: Protonix.  Thank you for choosing me and [facility].  Malcolm T. Stark, Jr., MD., FACG   

## 2013-10-13 NOTE — Progress Notes (Signed)
    History of Present Illness: This is a 57 year old male with a history of GERD and erosive esophagitis. He is maintained on pantoprazole 40 mg daily and has no ongoing GI complaints.  Current Medications, Allergies, Past Medical History, Past Surgical History, Family History and Social History were reviewed in [employer] record.  Physical Exam: General: Well developed , well nourished, no acute distress Head: Normocephalic and atraumatic Eyes:  sclerae anicteric, EOMI Ears: Normal auditory acuity Mouth: No deformity or lesions Lungs: Clear throughout to auscultation Heart: Regular rate and rhythm; no murmurs, rubs or bruits Abdomen: Soft, non tender and non distended. No masses, hepatosplenomegaly or hernias noted. Normal Bowel sounds Musculoskeletal: Symmetrical with no gross deformities  Pulses:  Normal pulses noted Extremities: No clubbing, cyanosis, edema or deformities noted Neurological: Alert oriented x 4, grossly nonfocal Psychological:  Alert and cooperative. Normal mood and affect  Assessment and Recommendations:  1. GERD with a history of erosive esophagitis. Continue all standard antireflux measures and pantoprazole 40 mg daily.  2. Personal history of adenomatous colon polyps. Surveillance colonoscopy recommended at 5 years, August 2016.

## 2013-11-03 ENCOUNTER — Other Ambulatory Visit: Payer: Self-pay

## 2013-11-28 ENCOUNTER — Other Ambulatory Visit (INDEPENDENT_AMBULATORY_CARE_PROVIDER_SITE_OTHER): Payer: Commercial Managed Care - PPO

## 2013-11-28 DIAGNOSIS — Z0000 Encounter for general adult medical examination without abnormal findings: Secondary | ICD-10-CM

## 2013-11-28 LAB — BASIC METABOLIC PANEL
Calcium: 9.3 mg/dL (ref 8.4–10.5)
GFR: 68.52 mL/min (ref >60.00)
Glucose, Bld: 87 mg/dL (ref 70–99)
Sodium: 140 mEq/L (ref 135–145)

## 2013-11-28 LAB — CBC WITH DIFFERENTIAL/PLATELET
Basophils Absolute: 0 10*3/uL (ref 0.0–0.1)
Hemoglobin: 13.9 g/dL (ref 13.0–17.0)
Lymphocytes Relative: 38.3 % (ref 12.0–46.0)
Monocytes Relative: 8.3 % (ref 3.0–12.0)
Neutro Abs: 3.2 10*3/uL (ref 1.4–7.7)
RDW: 14 % (ref 11.5–14.6)

## 2013-11-28 LAB — LIPID PANEL
Total CHOL/HDL Ratio: 4
Triglycerides: 216 mg/dL — ABNORMAL HIGH (ref 0.0–149.0)
VLDL: 43.2 mg/dL — ABNORMAL HIGH (ref 0.0–40.0)

## 2013-11-28 LAB — POCT URINALYSIS DIPSTICK
Bilirubin, UA: NEGATIVE
Blood, UA: NEGATIVE
Glucose, UA: NEGATIVE
Spec Grav, UA: >=1.03

## 2013-11-28 LAB — LDL CHOLESTEROL, DIRECT: Direct LDL: 88 mg/dL

## 2013-11-28 LAB — PSA: PSA: 1.01 ng/mL (ref 0.10–4.00)

## 2013-11-28 LAB — HEPATIC FUNCTION PANEL
AST: 31 U/L (ref 0–37)
Albumin: 4.1 g/dL (ref 3.5–5.2)
Alkaline Phosphatase: 41 U/L (ref 39–117)
Total Bilirubin: 0.5 mg/dL (ref 0.3–1.2)

## 2013-11-30 ENCOUNTER — Encounter: Payer: Commercial Managed Care - PPO | Admitting: Internal Medicine

## 2013-12-05 ENCOUNTER — Encounter: Payer: Self-pay | Admitting: Internal Medicine

## 2013-12-05 ENCOUNTER — Ambulatory Visit (INDEPENDENT_AMBULATORY_CARE_PROVIDER_SITE_OTHER): Payer: Commercial Managed Care - PPO | Admitting: Internal Medicine

## 2013-12-05 VITALS — BP 114/66 | HR 64 | Temp 98.1°F | Ht 71.0 in | Wt 184.0 lb

## 2013-12-05 DIAGNOSIS — Z0000 Encounter for general adult medical examination without abnormal findings: Secondary | ICD-10-CM

## 2013-12-05 NOTE — Progress Notes (Signed)
Pre visit review using our clinic review tool, if applicable. No additional management support is needed unless otherwise documented below in the visit note. 

## 2013-12-05 NOTE — Progress Notes (Signed)
CPX  Past Medical History  Diagnosis Date  . GERD (gastroesophageal reflux disease)   . Hyperlipidemia   . History of cerebrovascular accident   . Depression (disease)   . Hypertension   . Hemorrhoids   . Anxiety   . Erosive esophagitis   . Tubular adenoma polyp of rectum 07/2010    History   Social History  . Marital Status: Married    Spouse Name: N/A    Number of Children: 2  . Years of Education: N/A   Occupational History  . [employer]    Social History Main Topics  . Smoking status: Never Smoker   . Smokeless tobacco: Never Used  . Alcohol Use: No  . Drug Use: No  . Sexual Activity: Not on file   Other Topics Concern  . Not on file   Social History Narrative   Cell [phone]   [employer] release on 05-20-2010    Past Surgical History  Procedure Laterality Date  . Colon polyp removal  1967    Family History  Problem Relation Age of Onset  . Parkinsonism Mother   . Coronary artery disease Father   . Heart disease Maternal Grandfather   . Diabetes Maternal Grandmother     No Known Allergies  Current Outpatient Prescriptions on File Prior to Visit  Medication Sig Dispense Refill  . aspirin 325 MG tablet Take 325 mg by mouth daily.        Marland Kitchen atorvastatin (LIPITOR) 40 MG tablet take 1 tablet by mouth once daily  30 tablet  11  . clopidogrel (PLAVIX) 75 MG tablet take 1 tablet by mouth once daily  30 tablet  6  . fish oil-omega-3 fatty acids 1000 MG capsule Take 3 g by mouth daily.        . Melatonin 5 MG TABS Take 5 mg by mouth as needed.       . niacin 500 MG tablet Take 500 mg by mouth. 2 po at bedtime        . pantoprazole (PROTONIX) 40 MG tablet Take 1 tablet (40 mg total) by mouth daily.  30 tablet  11  . PARoxetine (PAXIL) 20 MG tablet take 1 tablet by mouth once daily  30 tablet  11   No current facility-administered medications on file prior to visit.     patient denies chest pain, shortness of breath, orthopnea. Denies  lower extremity edema, abdominal pain, change in appetite, change in bowel movements. Patient denies rashes, musculoskeletal complaints. No other specific complaints in a complete review of systems.   BP 114/66  Pulse 64  Temp(Src) 98.1 F (36.7 C) (Oral)  Ht 5\' 11"  (1.803 m)  Wt 184 lb (83.462 kg)  BMI 25.67 kg/m2  well-developed well-nourished male in no acute distress. HEENT exam atraumatic, normocephalic, neck supple without jugular venous distention. Chest clear to auscultation cardiac exam S1-S2 are regular. Abdominal exam overweight with bowel sounds, soft and nontender. Extremities no edema. Neurologic exam is alert with a normal gait.  Well visit- health maint UTD  Stroke - no recurrence, continue risk factor modification Lipids- adequately controlled

## 2013-12-16 ENCOUNTER — Other Ambulatory Visit: Payer: Self-pay | Admitting: Internal Medicine

## 2014-04-25 ENCOUNTER — Other Ambulatory Visit: Payer: Self-pay | Admitting: Internal Medicine

## 2014-08-23 ENCOUNTER — Other Ambulatory Visit: Payer: Self-pay | Admitting: Internal Medicine

## 2014-09-26 ENCOUNTER — Other Ambulatory Visit: Payer: Self-pay | Admitting: Internal Medicine

## 2014-10-23 ENCOUNTER — Other Ambulatory Visit: Payer: Self-pay | Admitting: *Deleted

## 2014-10-23 MED ORDER — PANTOPRAZOLE SODIUM 40 MG PO TBEC: 40.0000 mg | DELAYED_RELEASE_TABLET | Freq: Every day | ORAL | Status: DC

## 2014-11-24 ENCOUNTER — Other Ambulatory Visit: Payer: Self-pay | Admitting: Internal Medicine

## 2014-11-28 ENCOUNTER — Other Ambulatory Visit: Payer: Self-pay | Admitting: *Deleted

## 2014-11-28 MED ORDER — PANTOPRAZOLE SODIUM 40 MG PO TBEC: 40.0000 mg | DELAYED_RELEASE_TABLET | Freq: Every day | ORAL | Status: DC

## 2014-12-04 ENCOUNTER — Ambulatory Visit (INDEPENDENT_AMBULATORY_CARE_PROVIDER_SITE_OTHER): Payer: Commercial Managed Care - PPO | Admitting: Gastroenterology

## 2014-12-04 ENCOUNTER — Encounter: Payer: Self-pay | Admitting: Gastroenterology

## 2014-12-04 VITALS — BP 132/72 | HR 60 | Ht 71.0 in | Wt 189.1 lb

## 2014-12-04 DIAGNOSIS — K2100 Gastro-esophageal reflux disease with esophagitis, without bleeding: Secondary | ICD-10-CM

## 2014-12-04 DIAGNOSIS — Z86010 Personal history of colonic polyps: Secondary | ICD-10-CM

## 2014-12-04 MED ORDER — PANTOPRAZOLE SODIUM 40 MG PO TBEC: 40.0000 mg | DELAYED_RELEASE_TABLET | Freq: Every day | ORAL | Status: DC

## 2014-12-04 NOTE — Progress Notes (Signed)
    History of Present Illness: This is a 58 year old male with GERD and a history of erosive esophagitis. He takes daily Protonix and his reflux symptoms are under excellent control. He has no gastrointestinal complaints.  Current Medications, Allergies, Past Medical History, Past Surgical History, Family History and Social History were reviewed in [employer] record.  Physical Exam: General: Well developed , well nourished, no acute distress Head: Normocephalic and atraumatic Eyes:  sclerae anicteric, EOMI Ears: Normal auditory acuity Mouth: No deformity or lesions Lungs: Clear throughout to auscultation Heart: Regular rate and rhythm; no murmurs, rubs or bruits Abdomen: Soft, non tender and non distended. No masses, hepatosplenomegaly or hernias noted. Normal Bowel sounds Musculoskeletal: Symmetrical with no gross deformities  Pulses:  Normal pulses noted Extremities: No clubbing, cyanosis, edema or deformities noted Neurological: Alert oriented x 4, grossly nonfocal Psychological:  Alert and cooperative. Normal mood and affect  Assessment and Recommendations:  1. GERD with a history of erosive esophagitis. Continue all standard antireflux measures and pantoprazole 40 mg daily.  2. Personal history of adenomatous colon polyps. Surveillance colonoscopy recommended at 5 years, August 2016.

## 2014-12-04 NOTE — Patient Instructions (Signed)
We have sent the following medications to your pharmacy for you to pick up at your convenience:  Protonix  You have a recall colon scheduled for 07/2015.  You will receive a letter a month ahead of time reminding you to call and schedule this.

## 2014-12-11 ENCOUNTER — Ambulatory Visit (INDEPENDENT_AMBULATORY_CARE_PROVIDER_SITE_OTHER): Payer: Commercial Managed Care - PPO | Admitting: Family Medicine

## 2014-12-11 ENCOUNTER — Encounter: Payer: Self-pay | Admitting: Family Medicine

## 2014-12-11 VITALS — BP 122/72 | Temp 97.9°F | Wt 185.0 lb

## 2014-12-11 DIAGNOSIS — IMO0001 Reserved for inherently not codable concepts without codable children: Secondary | ICD-10-CM

## 2014-12-11 DIAGNOSIS — I6330 Cerebral infarction due to thrombosis of unspecified cerebral artery: Secondary | ICD-10-CM

## 2014-12-11 DIAGNOSIS — Z0000 Encounter for general adult medical examination without abnormal findings: Secondary | ICD-10-CM

## 2014-12-11 DIAGNOSIS — K2100 Gastro-esophageal reflux disease with esophagitis, without bleeding: Secondary | ICD-10-CM

## 2014-12-11 DIAGNOSIS — E785 Hyperlipidemia, unspecified: Secondary | ICD-10-CM

## 2014-12-11 DIAGNOSIS — R739 Hyperglycemia, unspecified: Secondary | ICD-10-CM

## 2014-12-11 DIAGNOSIS — E119 Type 2 diabetes mellitus without complications: Secondary | ICD-10-CM | POA: Insufficient documentation

## 2014-12-11 LAB — CBC
HCT: 40.9 % (ref 39.0–52.0)
Hemoglobin: 13.4 g/dL (ref 13.0–17.0)
MCHC: 32.8 g/dL (ref 30.0–36.0)
MCV: 90.1 fl (ref 78.0–100.0)
Platelets: 214 10*3/uL (ref 150.0–400.0)
RBC: 4.54 Mil/uL (ref 4.22–5.81)
RDW: 14.1 % (ref 11.5–15.5)
WBC: 5.6 10*3/uL (ref 4.0–10.5)

## 2014-12-11 LAB — COMPREHENSIVE METABOLIC PANEL
ALBUMIN: 4.1 g/dL (ref 3.5–5.2)
ALK PHOS: 48 U/L (ref 39–117)
ALT: 41 U/L (ref 0–53)
AST: 29 U/L (ref 0–37)
BUN: 23 mg/dL (ref 6–23)
CO2: 27 mEq/L (ref 19–32)
Calcium: 9.2 mg/dL (ref 8.4–10.5)
Chloride: 106 mEq/L (ref 96–112)
Creatinine, Ser: 0.9 mg/dL (ref 0.4–1.5)
GFR: 87.89 mL/min (ref >60.00)
Glucose, Bld: 102 mg/dL — ABNORMAL HIGH (ref 70–99)
POTASSIUM: 4.5 meq/L (ref 3.5–5.1)
SODIUM: 138 meq/L (ref 135–145)
TOTAL PROTEIN: 6.4 g/dL (ref 6.0–8.3)
Total Bilirubin: 0.6 mg/dL (ref 0.2–1.2)

## 2014-12-11 LAB — LIPID PANEL
Cholesterol: 148 mg/dL (ref 0–200)
HDL: 36.3 mg/dL — ABNORMAL LOW (ref >39.00)
LDL CALC: 85 mg/dL (ref 0–99)
NonHDL: 111.7
Total CHOL/HDL Ratio: 4
Triglycerides: 135 mg/dL (ref 0.0–149.0)
VLDL: 27 mg/dL (ref 0.0–40.0)

## 2014-12-11 LAB — PSA: PSA: 1.12 ng/mL (ref 0.10–4.00)

## 2014-12-11 LAB — HEMOGLOBIN A1C: HEMOGLOBIN A1C: 6.6 % — AB (ref 4.6–6.5)

## 2014-12-11 LAB — TSH: TSH: 1.66 u[IU]/mL (ref 0.35–4.50)

## 2014-12-11 MED ORDER — CLOPIDOGREL BISULFATE 75 MG PO TABS: 75.0000 mg | ORAL_TABLET | Freq: Every day | ORAL | Status: DC

## 2014-12-11 MED ORDER — PAROXETINE HCL 20 MG PO TABS: 20.0000 mg | ORAL_TABLET | Freq: Every day | ORAL | Status: DC

## 2014-12-11 MED ORDER — PANTOPRAZOLE SODIUM 40 MG PO TBEC: 40.0000 mg | DELAYED_RELEASE_TABLET | Freq: Every day | ORAL | Status: DC

## 2014-12-11 MED ORDER — ATORVASTATIN CALCIUM 40 MG PO TABS: 40.0000 mg | ORAL_TABLET | Freq: Every day | ORAL | Status: DC

## 2014-12-11 NOTE — Assessment & Plan Note (Signed)
a1c elevated previously. Recheck a1c.

## 2014-12-11 NOTE — Patient Instructions (Addendum)
Cholesterol looked reasonable last visit. Recheck today.   History of stroke-continue plavix and aspirin and cholesterol control  Depression well controlled.   Recheck a1c given risk for diabetes. Doubt even if elevated you would need medicine given your lifestyle.   Update fasting labs today  See you around 6 months for your physical including rectal exam.   We can do a full skin exam at that time.

## 2014-12-11 NOTE — Assessment & Plan Note (Signed)
Doing well without recurrence. Continue asa and plavix.

## 2014-12-11 NOTE — Progress Notes (Signed)
Garret Reddish, MD Phone: [phone]  Subjective:   Darrell Gaines is a 58 y.o. year old very pleasant male patient who presents with the following:  Hyperlipidemia-well controlled  Lab Results  Component Value Date   LDLCALC 85 12/11/2014  On statin: atorvastatin 40mg , niacin, fish oil Regular exercise: Run 4x a week (15-20 miles) with some additional yardwork.  ROS- no chest pain or shortness of breath. No myalgias  CVA- prior history, no residual effects 2001 slurred speech, unsteady walking at time- no residual symptoms. No clear underlying etiology. Stable on plavix and aspirin without recurrence.  ROS- No extremity weakness, no slurred words or speech  GERD-controlled Has done well on protonix for several years. If he misses a dose, has recurrence.  ROS- no cough or throat irritation  Hyperglycemia Elevated a1c previously. Discussed patient is at risk for diabetes. Exercising for prevention.   Past Medical History- Patient Active Problem List   Diagnosis Date Noted  . CVA (cerebral infarction) 07/26/2007    Priority: High  . Hyperglycemia 12/11/2014    Priority: Medium  . Depression 08/21/2008    Priority: Medium  . Hyperlipidemia 07/26/2007    Priority: Medium  . GERD 07/26/2007    Priority: Low   Medications- reviewed and updated Current Outpatient Prescriptions  Medication Sig Dispense Refill  . aspirin 325 MG tablet Take 325 mg by mouth daily.      Marland Kitchen atorvastatin (LIPITOR) 40 MG tablet Take 1 tablet (40 mg total) by mouth daily. 30 tablet 11  . clopidogrel (PLAVIX) 75 MG tablet Take 1 tablet (75 mg total) by mouth daily. 30 tablet 11  . fish oil-omega-3 fatty acids 1000 MG capsule Take 3 g by mouth daily.      . niacin 500 MG tablet Take 500 mg by mouth. 2 po at bedtime      . pantoprazole (PROTONIX) 40 MG tablet Take 1 tablet (40 mg total) by mouth daily. 30 tablet 11  . PARoxetine (PAXIL) 20 MG tablet Take 1 tablet (20 mg total) by mouth daily. 30  tablet 11   No current facility-administered medications for this visit.    Objective: BP 122/72 mmHg  Temp(Src) 97.9 F (36.6 C)  Wt 185 lb (83.915 kg) Gen: NAD, resting comfortably CV: RRR no murmurs rubs or gallops Lungs: CTAB no crackles, wheeze, rhonchi Abdomen: soft/nontender/nondistended/normal bowel sounds.  Ext: no edema, 2+ PT pulses Skin: warm, dry, balding. Seborrheic keratosis midportion of forehead approximately 73mm x 32mm Neuro: grossly normal, moves all extremities   Assessment/Plan:  CVA (cerebral infarction) Doing well without recurrence. Continue asa and plavix.   Hyperlipidemia Well controlled previously on Atorvastatin 40mg , niacin, fish oil. Discussed no clear morbidity data for  niacin and fish oil but plan to continue.   GERD Well controlled on protonix. Recurrence if off so will continue. Discussed cardiac risk of PPI present.   Hyperglycemia a1c elevated previously. Recheck a1c.   Return precautions advised. 6 month physical follow up.   Fasting-updating all physical labs today. PSA today with rectal exam next visit Orders Placed This Encounter  Procedures  . CBC    [place]  . Comprehensive metabolic panel    [place]    Order Specific Question:  Has the patient fasted?    Answer:  No  . Lipid panel    [place]    Order Specific Question:  Has the patient fasted?    Answer:  No  . Hemoglobin A1c    [place]  . TSH  [place]  . PSA   Refills for a year Meds ordered this encounter  Medications  . atorvastatin (LIPITOR) 40 MG tablet    Sig: Take 1 tablet (40 mg total) by mouth daily.    Dispense:  30 tablet    Refill:  11  . clopidogrel (PLAVIX) 75 MG tablet    Sig: Take 1 tablet (75 mg total) by mouth daily.    Dispense:  30 tablet    Refill:  11  . pantoprazole (PROTONIX) 40 MG tablet    Sig: Take 1 tablet (40 mg total) by mouth daily.    Dispense:  30 tablet    Refill:  11  . PARoxetine (PAXIL) 20 MG tablet    Sig: Take 1  tablet (20 mg total) by mouth daily.    Dispense:  30 tablet    Refill:  11

## 2014-12-11 NOTE — Assessment & Plan Note (Addendum)
Well controlled previously on Atorvastatin 40mg , niacin, fish oil. Discussed no clear morbidity data for  niacin and fish oil but plan to continue.

## 2014-12-11 NOTE — Assessment & Plan Note (Signed)
Well controlled on protonix. Recurrence if off so will continue. Discussed cardiac risk of PPI present.

## 2015-04-16 ENCOUNTER — Other Ambulatory Visit (INDEPENDENT_AMBULATORY_CARE_PROVIDER_SITE_OTHER): Payer: Commercial Managed Care - PPO

## 2015-04-16 DIAGNOSIS — Z0000 Encounter for general adult medical examination without abnormal findings: Secondary | ICD-10-CM

## 2015-04-16 LAB — POCT URINALYSIS DIPSTICK
Bilirubin, UA: NEGATIVE
Blood, UA: NEGATIVE
GLUCOSE UA: NEGATIVE
KETONES UA: NEGATIVE
Leukocytes, UA: NEGATIVE
Nitrite, UA: NEGATIVE
Protein, UA: NEGATIVE
SPEC GRAV UA: 1.02
UROBILINOGEN UA: 0.2
pH, UA: 5.5

## 2015-04-16 LAB — BASIC METABOLIC PANEL
BUN: 23 mg/dL (ref 6–23)
CALCIUM: 9.6 mg/dL (ref 8.4–10.5)
CO2: 30 meq/L (ref 19–32)
CREATININE: 1.12 mg/dL (ref 0.40–1.50)
Chloride: 101 mEq/L (ref 96–112)
GFR: 71.71 mL/min (ref >60.00)
Glucose, Bld: 103 mg/dL — ABNORMAL HIGH (ref 70–99)
Potassium: 4.6 mEq/L (ref 3.5–5.1)
SODIUM: 138 meq/L (ref 135–145)

## 2015-04-16 LAB — CBC WITH DIFFERENTIAL/PLATELET
Basophils Absolute: 0 10*3/uL (ref 0.0–0.1)
Basophils Relative: 0.3 % (ref 0.0–3.0)
EOS ABS: 0.1 10*3/uL (ref 0.0–0.7)
Eosinophils Relative: 1.4 % (ref 0.0–5.0)
HEMATOCRIT: 40.8 % (ref 39.0–52.0)
HEMOGLOBIN: 13.6 g/dL (ref 13.0–17.0)
LYMPHS ABS: 2.3 10*3/uL (ref 0.7–4.0)
Lymphocytes Relative: 39 % (ref 12.0–46.0)
MCHC: 33.4 g/dL (ref 30.0–36.0)
MCV: 88.6 fl (ref 78.0–100.0)
MONO ABS: 0.5 10*3/uL (ref 0.1–1.0)
Monocytes Relative: 8.7 % (ref 3.0–12.0)
NEUTROS ABS: 3 10*3/uL (ref 1.4–7.7)
Neutrophils Relative %: 50.6 % (ref 43.0–77.0)
Platelets: 206 10*3/uL (ref 150.0–400.0)
RBC: 4.6 Mil/uL (ref 4.22–5.81)
RDW: 13.9 % (ref 11.5–15.5)
WBC: 5.9 10*3/uL (ref 4.0–10.5)

## 2015-04-16 LAB — LIPID PANEL
Cholesterol: 142 mg/dL (ref 0–200)
HDL: 43.5 mg/dL (ref >39.00)
LDL Cholesterol: 76 mg/dL (ref 0–99)
NONHDL: 98.5
Total CHOL/HDL Ratio: 3
Triglycerides: 112 mg/dL (ref 0.0–149.0)
VLDL: 22.4 mg/dL (ref 0.0–40.0)

## 2015-04-16 LAB — HEPATIC FUNCTION PANEL
ALT: 38 U/L (ref 0–53)
AST: 28 U/L (ref 0–37)
Albumin: 4.5 g/dL (ref 3.5–5.2)
Alkaline Phosphatase: 49 U/L (ref 39–117)
BILIRUBIN DIRECT: 0.1 mg/dL (ref 0.0–0.3)
Total Bilirubin: 0.5 mg/dL (ref 0.2–1.2)
Total Protein: 6.8 g/dL (ref 6.0–8.3)

## 2015-04-16 LAB — TSH: TSH: 1.96 u[IU]/mL (ref 0.35–4.50)

## 2015-04-16 LAB — PSA: PSA: 1.03 ng/mL (ref 0.10–4.00)

## 2015-04-23 ENCOUNTER — Encounter: Payer: Self-pay | Admitting: Family Medicine

## 2015-04-23 ENCOUNTER — Ambulatory Visit (INDEPENDENT_AMBULATORY_CARE_PROVIDER_SITE_OTHER): Payer: Commercial Managed Care - PPO | Admitting: Family Medicine

## 2015-04-23 VITALS — BP 120/72 | HR 54 | Temp 98.0°F | Ht 71.0 in | Wt 187.0 lb

## 2015-04-23 DIAGNOSIS — Z0000 Encounter for general adult medical examination without abnormal findings: Secondary | ICD-10-CM | POA: Diagnosis not present

## 2015-04-23 NOTE — Progress Notes (Signed)
Darrell Reddish, MD Phone: [phone]  Subjective:  Patient presents today for their annual physical. Chief complaint-noted.   Discussed at risk for diabetes with CBG 103. Patient in good shape and training for 1/2 marathon currently at 2025 miles a week. Polyp in 2011 with colonoscopy planned this year. Lipids look great. Weight up slightly but patient working on this.   ROS- full  review of systems was completed and negative   The following were reviewed and entered/updated in epic: Past Medical History  Diagnosis Date  . GERD (gastroesophageal reflux disease)   . Hyperlipidemia   . History of cerebrovascular accident   . Depression (disease)   . Hypertension   . Hemorrhoids   . Anxiety   . Erosive esophagitis   . Tubular adenoma polyp of rectum 07/2010   Patient Active Problem List   Diagnosis Date Noted  . CVA (cerebral infarction) 07/26/2007    Priority: High  . Hyperglycemia 12/11/2014    Priority: Medium  . Depression 08/21/2008    Priority: Medium  . Hyperlipidemia 07/26/2007    Priority: Medium  . GERD 07/26/2007    Priority: Low   Past Surgical History  Procedure Laterality Date  . Colon polyp removal  1967    Family History  Problem Relation Age of Onset  . Parkinson's disease Mother   . Coronary artery disease Father     MI 77, infection related potentially  . Heart disease Maternal Grandfather     unknown age, MI at some age  . Diabetes Maternal Grandmother     Medications- reviewed and updated Current Outpatient Prescriptions  Medication Sig Dispense Refill  . aspirin 325 MG tablet Take 325 mg by mouth daily.      Marland Kitchen atorvastatin (LIPITOR) 40 MG tablet Take 1 tablet (40 mg total) by mouth daily. 30 tablet 11  . clopidogrel (PLAVIX) 75 MG tablet Take 1 tablet (75 mg total) by mouth daily. 30 tablet 11  . fish oil-omega-3 fatty acids 1000 MG capsule Take 3 g by mouth daily.      . niacin 500 MG tablet Take 500 mg by mouth. 2 po at bedtime        . pantoprazole (PROTONIX) 40 MG tablet Take 1 tablet (40 mg total) by mouth daily. 30 tablet 11  . PARoxetine (PAXIL) 20 MG tablet Take 1 tablet (20 mg total) by mouth daily. 30 tablet 11   No current facility-administered medications for this visit.    Allergies-reviewed and updated No Known Allergies  History   Social History  . Marital Status: Married    Spouse Name: N/A  . Number of Children: 2  . Years of Education: N/A   Occupational History  . [employer]    Social History Main Topics  . Smoking status: Never Smoker   . Smokeless tobacco: Never Used  . Alcohol Use: No  . Drug Use: No  . Sexual Activity: Not on file   Other Topics Concern  . None   Social History Narrative   Married (spouse at outside practice), 2 sons in college in 2016.       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 Oropharynx normal Neck: no thyromegaly CV: RRR no murmurs rubs or gallops Lungs: CTAB no crackles, wheeze, rhonchi Abdomen: soft/nontender/nondistended/normal bowel sounds. No rebound or guarding.  Ext: no edema Rectal: normal tone, normal prostate, no masses or tenderness Skin: warm, dry, no rash Neuro: grossly normal, moves all extremities, PERRLA  Assessment/Plan:  59 y.o. male presenting for annual physical.  Health Maintenance counseling: 1. Anticipatory guidance: Patient counseled regarding regular dental exams, wearing seatbelts, wearing sunscreen, as needed eye visits for distance 2. Risk factor reduction:  Advised patient of need for regular exercise and diet rich and fruits and vegetables to  reduce risk of heart attack and stroke.  3. Immunizations/screenings/ancillary studies- up to date except HIV screen  6 months. Get a1c, HIV next visit.  Did discuss unclear benefit for ASA, plavix combination and discussed considering plavix alone but patient wants to decline.

## 2015-04-23 NOTE — Patient Instructions (Addendum)
Overall things look great. As long as you continue your current medications, i would be ok seeing you in the 6-12 month range. Let's try to keep weight in 180-185 range. Next time we do blood, let's try to remember a1c and HIV (1x screening for anyone 75-58 years old).   Looks like they will call you in august for your next colonoscopy.   Best of luck on your half marathon!

## 2015-06-25 ENCOUNTER — Encounter: Payer: Self-pay | Admitting: Gastroenterology

## 2015-11-06 ENCOUNTER — Telehealth: Payer: Self-pay

## 2015-11-06 ENCOUNTER — Encounter: Payer: Self-pay | Admitting: Gastroenterology

## 2015-11-06 ENCOUNTER — Ambulatory Visit (INDEPENDENT_AMBULATORY_CARE_PROVIDER_SITE_OTHER): Payer: Commercial Managed Care - PPO | Admitting: Gastroenterology

## 2015-11-06 VITALS — BP 130/70 | HR 56 | Ht 70.5 in | Wt 190.2 lb

## 2015-11-06 DIAGNOSIS — K2100 Gastro-esophageal reflux disease with esophagitis, without bleeding: Secondary | ICD-10-CM

## 2015-11-06 DIAGNOSIS — Z8673 Personal history of transient ischemic attack (TIA), and cerebral infarction without residual deficits: Secondary | ICD-10-CM

## 2015-11-06 DIAGNOSIS — Z86010 Personal history of colonic polyps: Secondary | ICD-10-CM

## 2015-11-06 MED ORDER — NA SULFATE-K SULFATE-MG SULF 17.5-3.13-1.6 GM/177ML PO SOLN: 1.0000 | Freq: Once | ORAL | Status: DC

## 2015-11-06 MED ORDER — PANTOPRAZOLE SODIUM 40 MG PO TBEC: 40.0000 mg | DELAYED_RELEASE_TABLET | Freq: Every day | ORAL | Status: DC

## 2015-11-06 NOTE — Telephone Encounter (Signed)
He may hold for 5 days prior to procedure then start day after. Please make sure patient is aware there is some increased risk of stroke during this time he is off the medicine and he needs to seek care if any signs of stroke.

## 2015-11-06 NOTE — Patient Instructions (Signed)
We have sent the following medications to your pharmacy for you to pick up at your convenience:Protonix.   You have been scheduled for a colonoscopy. Please follow written instructions given to you at your visit today.  Please pick up your prep supplies at the pharmacy within the next 1-3 days. If you use inhalers (even only as needed), please bring them with you on the day of your procedure. Your physician has requested that you go to www.startemmi.com and enter the access code given to you at your visit today. This web site gives a general overview about your procedure. However, you should still follow specific instructions given to you by our office regarding your preparation for the procedure.  Thank you for choosing me and [facility].  Pricilla Riffle. Dagoberto Ligas., MD., Marval Regal

## 2015-11-06 NOTE — Telephone Encounter (Signed)
11/06/2015   RE: Darrell Gaines DOB: Jun 05, 1956 MRN: [id number]   Dear Dr. Yong Channel,    We have scheduled the above patient for an endoscopic procedure. Our records show that he is on anticoagulation therapy.   Please advise as to how long the patient may come off his therapy of Plavix prior to the procedure, which is scheduled for 01/09/16.  Please route your answer to Marlon Pel, [place]. Sincerely,    Marlon Pel, CMA

## 2015-11-06 NOTE — Progress Notes (Signed)
    History of Present Illness: This is a 59 year old male with a history of adenomatous colon polyps returning to discuss colonoscopy. He is maintained on Plavix for history of a small CVA in 2001. Reflux symptoms are well-controlled on Protonix daily. He has no gastrointestinal complaints. Denies weight loss, abdominal pain, constipation, diarrhea, change in stool caliber, melena, hematochezia, nausea, vomiting, dysphagia, reflux symptoms, chest pain.  Current Medications, Allergies, Past Medical History, Past Surgical History, Family History and Social History were reviewed in [employer] record.  Physical Exam: General: Well developed, well nourished, no acute distress Head: Normocephalic and atraumatic Eyes:  sclerae anicteric, EOMI Ears: Normal auditory acuity Mouth: No deformity or lesions Lungs: Clear throughout to auscultation Heart: Regular rate and rhythm; no murmurs, rubs or bruits Abdomen: Soft, non tender and non distended. No masses, hepatosplenomegaly or hernias noted. Normal Bowel sounds Rectal: Deferred to colonoscopy Musculoskeletal: Symmetrical with no gross deformities  Pulses:  Normal pulses noted Extremities: No clubbing, cyanosis, edema or deformities noted Neurological: Alert oriented x 4, grossly nonfocal Psychological:  Alert and cooperative. Normal mood and affect  Assessment and Recommendations:  1. Personal history of adenomatous colon polyps due for a five-year interval surveillance colonoscopy. The risks (including bleeding, perforation, infection, missed lesions, medication reactions and possible hospitalization or surgery if complications occur), benefits, and alternatives to colonoscopy with possible biopsy and possible polypectomy were discussed with the patient and they consent to proceed.   2. Hold Plavix 5 days before procedure - will instruct when and how to resume after procedure. Remain on aspirin 325 mg daily. Low but real  risk of cardiovascular event such as heart attack, stroke, embolism, thrombosis or ischemia/infarct of other organs off Plavix explained and need to seek urgent help if this occurs. The patient consents to proceed. Will communicate by phone or EMR with patient's prescribing provider to confirm that holding Plavix is reasonable in this case.   3. GERD with history of erosive esophagitis. Continue pantoprazole 40 mg daily and standard antireflux measures.

## 2015-11-07 NOTE — Telephone Encounter (Signed)
Informed patient of Dr. Ansel Bong recommendations of holding Plavix 5 days before his procedure. Pt verbalized understanding and also wants to reschedule his procedure to 01/14/15 at 10:30am. Colon rescheduled.

## 2015-11-07 NOTE — Telephone Encounter (Signed)
Patient returned phone call. Best # [phone]

## 2015-11-07 NOTE — Telephone Encounter (Signed)
Left a message for patient to return my call. 

## 2015-12-25 ENCOUNTER — Other Ambulatory Visit: Payer: Self-pay | Admitting: Family Medicine

## 2016-01-09 ENCOUNTER — Encounter: Payer: Commercial Managed Care - PPO | Admitting: Gastroenterology

## 2016-01-10 ENCOUNTER — Telehealth: Payer: Self-pay | Admitting: Gastroenterology

## 2016-01-10 NOTE — Telephone Encounter (Signed)
All questions answered about medications for procedures

## 2016-01-15 ENCOUNTER — Encounter: Payer: Self-pay | Admitting: Gastroenterology

## 2016-01-15 ENCOUNTER — Ambulatory Visit (AMBULATORY_SURGERY_CENTER): Payer: Commercial Managed Care - PPO | Admitting: Gastroenterology

## 2016-01-15 VITALS — BP 119/70 | HR 51 | Temp 96.5°F | Resp 16 | Ht 70.5 in | Wt 190.0 lb

## 2016-01-15 DIAGNOSIS — Z86010 Personal history of colonic polyps: Secondary | ICD-10-CM

## 2016-01-15 MED ORDER — SODIUM CHLORIDE 0.9 % IV SOLN: 500.0000 mL | INTRAVENOUS | Status: DC

## 2016-01-15 NOTE — Patient Instructions (Signed)
YOU HAD AN ENDOSCOPIC PROCEDURE TODAY AT THE [facility]:   Refer to the procedure report that was given to you for any specific questions about what was found during the examination.  If the procedure report does not answer your questions, please call your gastroenterologist to clarify.  If you requested that your care partner not be given the details of your procedure findings, then the procedure report has been included in a sealed envelope for you to review at your convenience later.  YOU SHOULD EXPECT: Some feelings of bloating in the abdomen. Passage of more gas than usual.  Walking can help get rid of the air that was put into your GI tract during the procedure and reduce the bloating. If you had a lower endoscopy (such as a colonoscopy or flexible sigmoidoscopy) you may notice spotting of blood in your stool or on the toilet paper. If you underwent a bowel prep for your procedure, you may not have a normal bowel movement for a few days.  Please Note:  You might notice some irritation and congestion in your nose or some drainage.  This is from the oxygen used during your procedure.  There is no need for concern and it should clear up in a day or so.  SYMPTOMS TO REPORT IMMEDIATELY:   Following lower endoscopy (colonoscopy or flexible sigmoidoscopy):  Excessive amounts of blood in the stool  Significant tenderness or worsening of abdominal pains  Swelling of the abdomen that is new, acute  Fever of 100F or higher    For urgent or emergent issues, a gastroenterologist can be reached at any hour by calling [phone].   DIET: Your first meal following the procedure should be a small meal and then it is ok to progress to your normal diet. Heavy or fried foods are harder to digest and may make you feel nauseous or bloated.  Likewise, meals heavy in dairy and vegetables can increase bloating.  Drink plenty of fluids but you should avoid alcoholic beverages for 24  hours.  ACTIVITY:  You should plan to take it easy for the rest of today and you should NOT DRIVE or use heavy machinery until tomorrow (because of the sedation medicines used during the test).    FOLLOW UP: Our staff will call the number listed on your records the next business day following your procedure to check on you and address any questions or concerns that you may have regarding the information given to you following your procedure. If we do not reach you, we will leave a message.  However, if you are feeling well and you are not experiencing any problems, there is no need to return our call.  We will assume that you have returned to your regular daily activities without incident.  If any biopsies were taken you will be contacted by phone or by letter within the next 1-3 weeks.  Please call us at [phone] if you have not heard about the biopsies in 3 weeks.    SIGNATURES/CONFIDENTIALITY: You and/or your care partner have signed paperwork which will be entered into your electronic medical record.  These signatures attest to the fact that that the information above on your After Visit Summary has been reviewed and is understood.  Full responsibility of the confidentiality of this discharge information lies with you and/or your care-partner.   Resume medications. Information given on hemorrhoids and high fiber diet. 

## 2016-01-15 NOTE — Progress Notes (Signed)
Report to PACU, RN, vss, BBS= Clear.  

## 2016-01-15 NOTE — Op Note (Signed)
[facility]  [address] [place], [id number]   COLONOSCOPY PROCEDURE REPORT  PATIENT: Darrell Gaines, Darrell Gaines  MR#: [id number] BIRTHDATE: [birth year] , 89  yrs. old GENDER: male ENDOSCOPIST: Ladene Artist, MD, [facility] PROCEDURE DATE:  01/15/2016 PROCEDURE:   Colonoscopy, surveillance First Screening Colonoscopy - Avg.  risk and is 50 yrs.  old or older - No.  Prior Negative Screening - Now for repeat screening. N/A  History of Adenoma - Now for follow-up colonoscopy & has been > or = to 3 yrs.  Yes hx of adenoma.  Has been 3 or more years since last colonoscopy.  Polyps removed today? No Recommend repeat exam, <10 yrs? Yes high risk ASA CLASS:   Class II INDICATIONS:Surveillance due to prior colonic neoplasia and PH Colon Adenoma. MEDICATIONS: Monitored anesthesia care and Propofol 200 mg IV DESCRIPTION OF PROCEDURE:   After the risks benefits and alternatives of the procedure were thoroughly explained, informed consent was obtained.  The digital rectal exam revealed no abnormalities of the rectum.   The LB [id number]  endoscope was introduced through the anus and advanced to the cecum, which was identified by both the appendix and ileocecal valve. No adverse events experienced.   The quality of the prep was excellent. (Suprep was used)  The instrument was then slowly withdrawn as the colon was fully examined. Estimated blood loss is zero unless otherwise noted in this procedure report.    COLON FINDINGS: A normal appearing cecum, ileocecal valve, and appendiceal orifice were identified.  The ascending, transverse, descending, sigmoid colon, and rectum appeared unremarkable. Retroflexed views revealed internal Grade I hemorrhoids. The time to cecum = 3.1 Withdrawal time = 9.6   The scope was withdrawn and the procedure completed. COMPLICATIONS: There were no immediate complications.  ENDOSCOPIC IMPRESSION: 1.  Normal colonoscopy 2.  Grade I internal  hemorrhoids  RECOMMENDATIONS: 1.  Repeat Colonoscopy in 5 years.  eSigned:  Ladene Artist, MD, [facility] 01/15/2016 10:47 AM

## 2016-01-16 ENCOUNTER — Telehealth: Payer: Self-pay

## 2016-01-16 NOTE — Telephone Encounter (Signed)
  Follow up Call-  Call back number 01/15/2016  Post procedure Call Back phone  # [phone]  Permission to leave phone message Yes     Patient was called for follow up after procedure on 01/15/2016. No answer at the number given. A message was left on his answering machine.

## 2016-01-24 ENCOUNTER — Telehealth: Payer: Self-pay | Admitting: Family Medicine

## 2016-01-24 NOTE — Telephone Encounter (Signed)
See below

## 2016-01-24 NOTE — Telephone Encounter (Signed)
Pt call to say his son is moving to the [facility] and is asking if you know any doctors there that his son may contact for a pcp.

## 2016-04-04 ENCOUNTER — Encounter: Payer: Self-pay | Admitting: Gastroenterology

## 2016-04-17 ENCOUNTER — Other Ambulatory Visit (INDEPENDENT_AMBULATORY_CARE_PROVIDER_SITE_OTHER): Payer: Commercial Managed Care - PPO

## 2016-04-17 DIAGNOSIS — Z0000 Encounter for general adult medical examination without abnormal findings: Secondary | ICD-10-CM | POA: Diagnosis not present

## 2016-04-17 LAB — LIPID PANEL
CHOL/HDL RATIO: 4
Cholesterol: 153 mg/dL (ref 0–200)
HDL: 39.3 mg/dL (ref >39.00)
LDL Cholesterol: 74 mg/dL (ref 0–99)
NONHDL: 113.41
Triglycerides: 198 mg/dL — ABNORMAL HIGH (ref 0.0–149.0)
VLDL: 39.6 mg/dL (ref 0.0–40.0)

## 2016-04-17 LAB — HEPATIC FUNCTION PANEL
ALBUMIN: 4.4 g/dL (ref 3.5–5.2)
ALK PHOS: 56 U/L (ref 39–117)
ALT: 36 U/L (ref 0–53)
AST: 23 U/L (ref 0–37)
BILIRUBIN DIRECT: 0.1 mg/dL (ref 0.0–0.3)
TOTAL PROTEIN: 6.7 g/dL (ref 6.0–8.3)
Total Bilirubin: 0.5 mg/dL (ref 0.2–1.2)

## 2016-04-17 LAB — BASIC METABOLIC PANEL
BUN: 22 mg/dL (ref 6–23)
CALCIUM: 9.4 mg/dL (ref 8.4–10.5)
CO2: 30 meq/L (ref 19–32)
Chloride: 100 mEq/L (ref 96–112)
Creatinine, Ser: 1.02 mg/dL (ref 0.40–1.50)
GFR: 79.6 mL/min (ref >60.00)
Glucose, Bld: 102 mg/dL — ABNORMAL HIGH (ref 70–99)
Potassium: 4.3 mEq/L (ref 3.5–5.1)
SODIUM: 137 meq/L (ref 135–145)

## 2016-04-17 LAB — CBC WITH DIFFERENTIAL/PLATELET
BASOS ABS: 0 10*3/uL (ref 0.0–0.1)
Basophils Relative: 0.4 % (ref 0.0–3.0)
Eosinophils Absolute: 0.1 10*3/uL (ref 0.0–0.7)
Eosinophils Relative: 1.6 % (ref 0.0–5.0)
HEMATOCRIT: 40.6 % (ref 39.0–52.0)
HEMOGLOBIN: 13.6 g/dL (ref 13.0–17.0)
LYMPHS PCT: 36 % (ref 12.0–46.0)
Lymphs Abs: 1.9 10*3/uL (ref 0.7–4.0)
MCHC: 33.5 g/dL (ref 30.0–36.0)
MCV: 86.5 fl (ref 78.0–100.0)
MONOS PCT: 8.3 % (ref 3.0–12.0)
Monocytes Absolute: 0.4 10*3/uL (ref 0.1–1.0)
NEUTROS ABS: 2.8 10*3/uL (ref 1.4–7.7)
Neutrophils Relative %: 53.7 % (ref 43.0–77.0)
PLATELETS: 204 10*3/uL (ref 150.0–400.0)
RBC: 4.69 Mil/uL (ref 4.22–5.81)
RDW: 14.5 % (ref 11.5–15.5)
WBC: 5.3 10*3/uL (ref 4.0–10.5)

## 2016-04-17 LAB — MICROALBUMIN / CREATININE URINE RATIO
Creatinine,U: 191.5 mg/dL
MICROALB UR: 0.9 mg/dL (ref 0.0–1.9)
Microalb Creat Ratio: 0.5 mg/g (ref 0.0–30.0)

## 2016-04-17 LAB — POC URINALSYSI DIPSTICK (AUTOMATED)
Bilirubin, UA: NEGATIVE
Glucose, UA: NEGATIVE
KETONES UA: NEGATIVE
Leukocytes, UA: NEGATIVE
Nitrite, UA: NEGATIVE
PROTEIN UA: NEGATIVE
RBC UA: NEGATIVE
SPEC GRAV UA: 1.02
UROBILINOGEN UA: 0.2
pH, UA: 6.5

## 2016-04-17 LAB — TSH: TSH: 2.21 u[IU]/mL (ref 0.35–4.50)

## 2016-04-17 LAB — PSA: PSA: 1.01 ng/mL (ref 0.10–4.00)

## 2016-04-17 LAB — HEMOGLOBIN A1C: HEMOGLOBIN A1C: 6.5 % (ref 4.6–6.5)

## 2016-04-24 ENCOUNTER — Encounter: Payer: Self-pay | Admitting: Family Medicine

## 2016-04-24 ENCOUNTER — Ambulatory Visit (INDEPENDENT_AMBULATORY_CARE_PROVIDER_SITE_OTHER): Payer: Commercial Managed Care - PPO | Admitting: Family Medicine

## 2016-04-24 VITALS — BP 130/64 | HR 60 | Temp 98.0°F | Ht 70.0 in | Wt 190.0 lb

## 2016-04-24 DIAGNOSIS — H9192 Unspecified hearing loss, left ear: Secondary | ICD-10-CM

## 2016-04-24 DIAGNOSIS — Z0001 Encounter for general adult medical examination with abnormal findings: Secondary | ICD-10-CM

## 2016-04-24 DIAGNOSIS — R21 Rash and other nonspecific skin eruption: Secondary | ICD-10-CM

## 2016-04-24 DIAGNOSIS — E119 Type 2 diabetes mellitus without complications: Secondary | ICD-10-CM

## 2016-04-24 DIAGNOSIS — Z86010 Personal history of colonic polyps: Secondary | ICD-10-CM | POA: Diagnosis not present

## 2016-04-24 DIAGNOSIS — E785 Hyperlipidemia, unspecified: Secondary | ICD-10-CM

## 2016-04-24 DIAGNOSIS — R351 Nocturia: Secondary | ICD-10-CM

## 2016-04-24 DIAGNOSIS — N401 Enlarged prostate with lower urinary tract symptoms: Secondary | ICD-10-CM

## 2016-04-24 DIAGNOSIS — R6889 Other general symptoms and signs: Secondary | ICD-10-CM

## 2016-04-24 DIAGNOSIS — E118 Type 2 diabetes mellitus with unspecified complications: Secondary | ICD-10-CM | POA: Diagnosis not present

## 2016-04-24 DIAGNOSIS — Z860101 Personal history of adenomatous and serrated colon polyps: Secondary | ICD-10-CM | POA: Insufficient documentation

## 2016-04-24 DIAGNOSIS — Z20828 Contact with and (suspected) exposure to other viral communicable diseases: Secondary | ICD-10-CM | POA: Diagnosis not present

## 2016-04-24 MED ORDER — TRIAMCINOLONE ACETONIDE 0.1 % EX CREA: 1.0000 "application " | TOPICAL_CREAM | Freq: Two times a day (BID) | CUTANEOUS | Status: DC

## 2016-04-24 MED ORDER — METFORMIN HCL 500 MG PO TABS: 500.0000 mg | ORAL_TABLET | Freq: Every day | ORAL | Status: DC

## 2016-04-24 NOTE — Patient Instructions (Addendum)
Trial triamcinolone for rash on neck. If not effective, can send me mychart message and we will refer to dermatology.   Schedule a lab visit at the check out desk for labs before your 6 month visit.Nothing but water after midnight please.   Start metformin 500mg . Sorry to inform you of new diagnosis of diabetes.   Start once a year eye exam- go ahead and call to schedule  We will call you within a week about your referral to ENT. If you do not hear within 2 weeks, give [place] a call.

## 2016-04-24 NOTE — Assessment & Plan Note (Signed)
controlled on atorvastatin 40mg , fish oil though slightly above goal of 70 at 74. We opted not to increase as already at high dose.

## 2016-04-24 NOTE — Assessment & Plan Note (Signed)
S: for years has had issues waking up at night to pee. Nocturia 2-3x a night despite not drinking water before bed and evacuating before bed. Not improving. Some daytime issues as well A/P: plan is to start with metformin this visit and see if sugar control helps nocturia at all, if not will add flomax at follow up

## 2016-04-24 NOTE — Progress Notes (Signed)
Phone: [phone]  Subjective:  Patient presents today for their annual physical. Chief complaint-noted.   See problem oriented charting- ROS- full  review of systems was completed and negative except for: nocturia. No chest pain or shortness of breath. No headache or blurry vision.   The following were reviewed and entered/updated in epic: Past Medical History  Diagnosis Date  . GERD (gastroesophageal reflux disease)   . Hyperlipidemia   . History of cerebrovascular accident   . Depression (disease)   . Hypertension   . Hemorrhoids   . Anxiety   . Erosive esophagitis   . Tubular adenoma polyp of rectum 07/2010  . Allergy   . Arthritis   . Stroke [facility])     2001   Patient Active Problem List   Diagnosis Date Noted  . Type II diabetes mellitus, well controlled ([place]) 12/11/2014    Priority: High  . History of CVA (cerebrovascular accident) 07/26/2007    Priority: High  . BPH associated with nocturia 04/24/2016    Priority: Medium  . Depression 08/21/2008    Priority: Medium  . Hyperlipidemia 07/26/2007    Priority: Medium  . History of adenomatous polyp of colon 04/24/2016    Priority: Low  . GERD 07/26/2007    Priority: Low   Past Surgical History  Procedure Laterality Date  . Colon polyp removal  1967  . Colonoscopy      Family History  Problem Relation Age of Onset  . Parkinson's disease Mother   . Coronary artery disease Father     MI 10, infection related potentially  . Heart disease Maternal Grandfather     unknown age, MI at some age  . Diabetes Maternal Grandmother   . Colon cancer Neg Hx   . Esophageal cancer Neg Hx   . Stomach cancer Neg Hx   . Rectal cancer Neg Hx     Medications- reviewed and updated Current Outpatient Prescriptions  Medication Sig Dispense Refill  . aspirin 325 MG tablet Take 325 mg by mouth daily.      Marland Kitchen atorvastatin (LIPITOR) 40 MG tablet take 1 tablet by mouth once daily 30 tablet 5  . clopidogrel (PLAVIX) 75 MG  tablet take 1 tablet by mouth once daily 30 tablet 5  . fish oil-omega-3 fatty acids 1000 MG capsule Take 3 g by mouth daily.      . niacin 500 MG tablet Take 500 mg by mouth. 2 po at bedtime      . pantoprazole (PROTONIX) 40 MG tablet Take 1 tablet (40 mg total) by mouth daily. 30 tablet 11  . PARoxetine (PAXIL) 20 MG tablet take 1 tablet by mouth once daily 30 tablet 5   No current facility-administered medications for this visit.    Allergies-reviewed and updated No Known Allergies  Social History   Social History  . Marital Status: Married    Spouse Name: N/A  . Number of Children: 2  . Years of Education: N/A   Occupational History  . pace communication    Social History Main Topics  . Smoking status: Never Smoker   . Smokeless tobacco: Never Used  . Alcohol Use: No  . Drug Use: No  . Sexual Activity: Not Asked   Other Topics Concern  . None   Social History Narrative   Married (spouse at outside practice), 2 sons in college in 2016.       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 Oropharynx normal Neck: no thyromegaly CV: RRR no murmurs rubs or gallops Lungs: CTAB no crackles, wheeze, rhonchi Abdomen: soft/nontender/nondistended/normal bowel sounds. No rebound or guarding.  Rectal: normal tone, diffusely enlarged prostate, no masses or tenderness Ext: no edema Skin: warm, dry Neuro: grossly normal, moves all extremities, PERRLA  Diabetic Foot Exam - Simple   Simple Foot Form  Diabetic Foot exam was performed with the following findings:  Yes 04/24/2016  8:55 AM  Visual Inspection  No  deformities, no ulcerations, no other skin breakdown bilaterally:  Yes  Sensation Testing  Intact to touch and monofilament testing bilaterally:  Yes  Pulse Check  Posterior Tibialis and Dorsalis pulse intact bilaterally:  Yes  Comments     Assessment/Plan:  60 y.o. male presenting for annual physical.  Health Maintenance counseling: 1. Anticipatory guidance: Patient counseled regarding regular dental exams, eye exams, wearing seatbelts.  2. Risk factor reduction:  Advised patient of need for regular exercise and diet rich and fruits and vegetables to reduce risk of heart attack and stroke. Running 12-15 miles a week, works out 3-5x a week.  3. Immunizations/screenings/ancillary studies Immunization History  Administered Date(s) Administered  . Influenza Whole 10/17/2009, 09/28/2012  . Influenza-Unspecified 10/12/2014  . Td 12/29/1997, 01/27/2008   Health Maintenance Due  Topic Date Due  . Hepatitis C Screening - next labs 1956/02/07  . HIV Screening - next labs 11/04/1972   4. Prostate cancer screening- low risk for prostate cancer based off of PSA and rectal   Lab Results  Component Value Date   PSA 1.01 04/17/2016   PSA 1.03 04/16/2015   PSA 1.12 12/11/2014   5. Colon cancer screening - 01/15/16 with 5 year follow up 6. Skin cancer screening- advised regular sunscreen use, waist up exam without obvious scc, bcc, or melanoma  History of CVA- on aspirin, plavix. No residual symptoms. Decrease asa to 81 mg, likely does not need as also on plavix Depression- controlled with paxil 20mg , poor sleep but due to nocturia GERD- on protonix with erosive esophagitis history  Rash in shaving distribution- could potentially trial different shaving cream, trial steroid cream 1 week max and may reuse if needed  Left ear hearing loss-S: Left ear gets clogged after URI- can last for months. Some decreased hearing. May lay down and resolves and feels fluid shift. Has never been seen by MD per  patient, active issue today.  Assess/Plan: unclear etiology- ? Effusion but not clear on exam. Will refer to ENT for their opinion, considered flonase but given 9 months of symptoms would have expected to improve in this interval    Type II diabetes mellitus, well controlled ([place]) S: unfortunate new onset diabetes. Did have elevated a1c at 6.6 before but 6.5 today confirms disease.  Lab Results  Component Value Date   HGBA1C 6.5 04/17/2016  A/P: we will add metformin 500mg  to help with disease progression. He will work on slight weight loss. Gave booklet on diabetes. Continue excellent exercise 12-15 miles a week running. Advised yearly eye exam, foot exam normal today. Pneumovax agrees to at follow up.   Hyperlipidemia controlled on atorvastatin 40mg ,  fish oil though slightly above goal of 70 at 74. We opted not to increase as already at high dose.   BPH associated with nocturia S: for years has had issues waking up at night to pee. Nocturia 2-3x a night despite not drinking water before bed and evacuating before bed. Not improving. Some daytime issues as well A/P: plan is to start with metformin this visit and see if sugar control helps nocturia at all, if not will add flomax at follow up    No Follow-up on file. Return precautions advised.   Orders Placed This Encounter  Procedures  . Hemoglobin A1c    Plum    Standing Status: Future     Number of Occurrences:      Standing Expiration Date: 04/24/2017  . Hepatitis C antibody, reflex    [facility]    Standing Status: Future     Number of Occurrences:      Standing Expiration Date: 04/24/2017  . HIV antibody    Standing Status: Future     Number of Occurrences:      Standing Expiration Date: 04/24/2017  . Ambulatory referral to ENT    Referral Priority:  Routine    Referral Type:  Consultation    Referral Reason:  Specialty Services Required    Requested Specialty:  Otolaryngology    Number of Visits Requested:  1     Meds ordered this encounter  Medications  . triamcinolone cream (KENALOG) 0.1 %    Sig: Apply 1 application topically 2 (two) times daily. For 1 week , then take 1-2 weeks off before retrying if effective. If not, update office.    Dispense:  80 g    Refill:  0  . aspirin 81 MG tablet    Sig: Take 81 mg by mouth daily.  . metFORMIN (GLUCOPHAGE) 500 MG tablet    Sig: Take 1 tablet (500 mg total) by mouth daily with breakfast.    Dispense:  90 tablet    Refill:  3    Garret Reddish, MD

## 2016-04-24 NOTE — Assessment & Plan Note (Addendum)
S: unfortunate new onset diabetes. Did have elevated a1c at 6.6 before but 6.5 today confirms disease.  Lab Results  Component Value Date   HGBA1C 6.5 04/17/2016  A/P: we will add metformin 500mg  to help with disease progression. He will work on slight weight loss. Gave booklet on diabetes. Continue excellent exercise 12-15 miles a week running. Advised yearly eye exam, foot exam normal today. Pneumovax agrees to at follow up.

## 2016-06-03 ENCOUNTER — Telehealth: Payer: Self-pay | Admitting: Family Medicine

## 2016-06-03 DIAGNOSIS — R21 Rash and other nonspecific skin eruption: Secondary | ICD-10-CM

## 2016-06-03 NOTE — Telephone Encounter (Signed)
Pt said the rash on his neck was not better and is asking for a referral to a dermatologist

## 2016-06-06 NOTE — Telephone Encounter (Signed)
Absolutely- may refer

## 2016-06-06 NOTE — Telephone Encounter (Signed)
Routing to Jaime  

## 2016-06-06 NOTE — Telephone Encounter (Signed)
Okay to send referral to Dermatology?

## 2016-06-09 NOTE — Telephone Encounter (Signed)
Pt referral to determatology completed. Called patient to let him know. Left a voicemail letting patient know dermatology would call and let him know about his appointment. Also stated if he had any questions to call back.

## 2016-06-12 ENCOUNTER — Encounter: Payer: Self-pay | Admitting: Family Medicine

## 2016-06-18 ENCOUNTER — Other Ambulatory Visit: Payer: Self-pay

## 2016-06-18 MED ORDER — ATORVASTATIN CALCIUM 40 MG PO TABS: 40.0000 mg | ORAL_TABLET | Freq: Every day | ORAL | Status: DC

## 2016-06-18 MED ORDER — CLOPIDOGREL BISULFATE 75 MG PO TABS: 75.0000 mg | ORAL_TABLET | Freq: Every day | ORAL | Status: DC

## 2016-06-18 MED ORDER — PAROXETINE HCL 20 MG PO TABS: 20.0000 mg | ORAL_TABLET | Freq: Every day | ORAL | Status: DC

## 2016-06-18 NOTE — Addendum Note (Signed)
Addended by: Milta Deiters on: 06/18/2016 10:20 AM   Modules accepted: Orders

## 2016-10-16 ENCOUNTER — Other Ambulatory Visit: Payer: Self-pay | Admitting: Family Medicine

## 2016-10-16 ENCOUNTER — Other Ambulatory Visit (INDEPENDENT_AMBULATORY_CARE_PROVIDER_SITE_OTHER): Payer: Commercial Managed Care - PPO

## 2016-10-16 DIAGNOSIS — E118 Type 2 diabetes mellitus with unspecified complications: Secondary | ICD-10-CM

## 2016-10-16 DIAGNOSIS — Z20828 Contact with and (suspected) exposure to other viral communicable diseases: Secondary | ICD-10-CM

## 2016-10-16 LAB — HEMOGLOBIN A1C: Hgb A1c MFr Bld: 6.3 % (ref 4.6–6.5)

## 2016-10-17 LAB — HIV ANTIBODY (ROUTINE TESTING W REFLEX): HIV: NONREACTIVE

## 2016-10-17 LAB — HEPATITIS C ANTIBODY: HCV Ab: NEGATIVE

## 2016-10-23 ENCOUNTER — Ambulatory Visit (INDEPENDENT_AMBULATORY_CARE_PROVIDER_SITE_OTHER): Payer: Commercial Managed Care - PPO | Admitting: Family Medicine

## 2016-10-23 ENCOUNTER — Encounter: Payer: Self-pay | Admitting: Family Medicine

## 2016-10-23 VITALS — BP 138/82 | HR 52 | Temp 97.8°F | Wt 187.0 lb

## 2016-10-23 DIAGNOSIS — F3342 Major depressive disorder, recurrent, in full remission: Secondary | ICD-10-CM

## 2016-10-23 DIAGNOSIS — E785 Hyperlipidemia, unspecified: Secondary | ICD-10-CM | POA: Diagnosis not present

## 2016-10-23 DIAGNOSIS — R351 Nocturia: Secondary | ICD-10-CM

## 2016-10-23 DIAGNOSIS — E119 Type 2 diabetes mellitus without complications: Secondary | ICD-10-CM | POA: Diagnosis not present

## 2016-10-23 DIAGNOSIS — N401 Enlarged prostate with lower urinary tract symptoms: Secondary | ICD-10-CM

## 2016-10-23 MED ORDER — TAMSULOSIN HCL 0.4 MG PO CAPS: 0.4000 mg | ORAL_CAPSULE | Freq: Every day | ORAL | 6 refills | Status: DC

## 2016-10-23 NOTE — Progress Notes (Signed)
Pre visit review using our clinic review tool, if applicable. No additional management support is needed unless otherwise documented below in the visit note. 

## 2016-10-23 NOTE — Assessment & Plan Note (Signed)
phq2 0 and no SI. States doing well

## 2016-10-23 NOTE — Assessment & Plan Note (Signed)
S: very well controlled. On metformin 500mg   Wt Readings from Last 3 Encounters:  10/23/16 187 lb (84.8 kg)  04/24/16 190 lb (86.2 kg)  01/15/16 190 lb (86.2 kg)  Exercise and diet- more plant based now, continuing exercise Lab Results  Component Value Date   HGBA1C 6.3 10/16/2016   HGBA1C 6.5 04/17/2016   HGBA1C 6.6 (H) 12/11/2014   A/P: continue low dose metformin- great control

## 2016-10-23 NOTE — Progress Notes (Signed)
Subjective:  Darrell Gaines is a 60 y.o. year old very pleasant male patient who presents for/with See problem oriented charting ROS- No chest pain or shortness of breath. No headache or blurry vision.  No hypoglycemia.see any ROS included in HPI as well.   Past Medical History-  Patient Active Problem List   Diagnosis Date Noted  . Type II diabetes mellitus, well controlled ([place]) 12/11/2014    Priority: High  . History of CVA (cerebrovascular accident) 07/26/2007    Priority: High  . BPH associated with nocturia 04/24/2016    Priority: Medium  . Depression 08/21/2008    Priority: Medium  . Hyperlipidemia 07/26/2007    Priority: Medium  . History of adenomatous polyp of colon 04/24/2016    Priority: Low  . GERD 07/26/2007    Priority: Low    Medications- reviewed and updated Current Outpatient Prescriptions  Medication Sig Dispense Refill  . aspirin 81 MG tablet Take 81 mg by mouth daily.    Marland Kitchen atorvastatin (LIPITOR) 40 MG tablet Take 1 tablet (40 mg total) by mouth daily. 30 tablet 5  . clopidogrel (PLAVIX) 75 MG tablet Take 1 tablet (75 mg total) by mouth daily. 30 tablet 5  . fish oil-omega-3 fatty acids 1000 MG capsule Take 3 g by mouth daily.      . metFORMIN (GLUCOPHAGE) 500 MG tablet Take 1 tablet (500 mg total) by mouth daily with breakfast. 90 tablet 3  . niacin 500 MG tablet Take 500 mg by mouth. 2 po at bedtime      . pantoprazole (PROTONIX) 40 MG tablet Take 1 tablet (40 mg total) by mouth daily. 30 tablet 11  . PARoxetine (PAXIL) 20 MG tablet Take 1 tablet (20 mg total) by mouth daily. 30 tablet 5  . tamsulosin (FLOMAX) 0.4 MG CAPS capsule Take 1 capsule (0.4 mg total) by mouth daily. 30 capsule 6   No current facility-administered medications for this visit.     Objective: BP 138/82 (BP Location: Left Arm, Patient Position: Sitting, Cuff Size: Normal)   Pulse (!) 52   Temp 97.8 F (36.6 C) (Oral)   Wt 187 lb (84.8 kg)   SpO2 98%   BMI 26.83 kg/m  Gen: NAD,  resting comfortably CV: RRR no murmurs rubs or gallops Lungs: CTAB no crackles, wheeze, rhonchi  Ext: no edema Skin: warm, dry, erythema around neck- saw derm and thought to be sun damage  Assessment/Plan:  Type II diabetes mellitus, well controlled ([place]) S: very well controlled. On metformin 500mg   Wt Readings from Last 3 Encounters:  10/23/16 187 lb (84.8 kg)  04/24/16 190 lb (86.2 kg)  01/15/16 190 lb (86.2 kg)  Exercise and diet- more plant based now, continuing exercise Lab Results  Component Value Date   HGBA1C 6.3 10/16/2016   HGBA1C 6.5 04/17/2016   HGBA1C 6.6 (H) 12/11/2014   A/P: continue low dose metformin- great control  BPH associated with nocturia S: still with nocturia 2-3 x a night A/P: wants to trial flomax at this point- goal at least 50% improvement   Hyperlipidemia S: reasonably controlled on atorvastatin 40mg , niacin, fish oil. No myalgias.  Lab Results  Component Value Date   CHOL 153 04/17/2016   HDL 39.30 04/17/2016   LDLCALC 74 04/17/2016   LDLDIRECT 88.0 11/28/2013   TRIG 198.0 (H) 04/17/2016   CHOLHDL 4 04/17/2016   A/P: we discussed that with more plant based diet- hopeful LDL will get below 70 which is desirable goal given  new onset diabetes   Depression phq2 0 and no SI. States doing well   Return in about 6 months (around 04/23/2017) for physical. Told him 04/24/17 or later based on last years  Meds ordered this encounter  Medications  . tamsulosin (FLOMAX) 0.4 MG CAPS capsule    Sig: Take 1 capsule (0.4 mg total) by mouth daily.    Dispense:  30 capsule    Refill:  6    Return precautions advised.  Garret Reddish, MD

## 2016-10-23 NOTE — Assessment & Plan Note (Signed)
S: reasonably controlled on atorvastatin 40mg , niacin, fish oil. No myalgias.  Lab Results  Component Value Date   CHOL 153 04/17/2016   HDL 39.30 04/17/2016   LDLCALC 74 04/17/2016   LDLDIRECT 88.0 11/28/2013   TRIG 198.0 (H) 04/17/2016   CHOLHDL 4 04/17/2016   A/P: we discussed that with more plant based diet- hopeful LDL will get below 70 which is desirable goal given new onset diabetes

## 2016-10-23 NOTE — Assessment & Plan Note (Signed)
S: still with nocturia 2-3 x a night A/P: wants to trial flomax at this point- goal at least 50% improvement

## 2016-10-23 NOTE — Patient Instructions (Addendum)
Update eye exam and have them send [place] records  Trial flomax for enlarged prostate- hope we can get you to 1-2 x a night peeing.   Great job on [employer] loss/healthy eating  a1c looks great- continue metformin  Cholesterol probably will be better- continue atorvastatin

## 2016-11-14 ENCOUNTER — Other Ambulatory Visit: Payer: Self-pay | Admitting: Gastroenterology

## 2016-12-18 ENCOUNTER — Other Ambulatory Visit: Payer: Self-pay | Admitting: Family Medicine

## 2016-12-30 ENCOUNTER — Other Ambulatory Visit: Payer: Self-pay | Admitting: Gastroenterology

## 2017-01-02 ENCOUNTER — Other Ambulatory Visit: Payer: Self-pay | Admitting: Gastroenterology

## 2017-02-04 ENCOUNTER — Encounter: Payer: Self-pay | Admitting: Gastroenterology

## 2017-02-04 ENCOUNTER — Ambulatory Visit (INDEPENDENT_AMBULATORY_CARE_PROVIDER_SITE_OTHER): Payer: Commercial Managed Care - PPO | Admitting: Gastroenterology

## 2017-02-04 VITALS — BP 124/74 | HR 54 | Ht 70.5 in | Wt 194.0 lb

## 2017-02-04 DIAGNOSIS — K2100 Gastro-esophageal reflux disease with esophagitis, without bleeding: Secondary | ICD-10-CM

## 2017-02-04 DIAGNOSIS — Z86010 Personal history of colonic polyps: Secondary | ICD-10-CM

## 2017-02-04 MED ORDER — PANTOPRAZOLE SODIUM 20 MG PO TBEC: 20.0000 mg | DELAYED_RELEASE_TABLET | Freq: Every day | ORAL | 11 refills | Status: DC

## 2017-02-04 NOTE — Patient Instructions (Signed)
We have sent the following medications to your pharmacy for you to pick up at your convenience:  Pantoprazole  Thank you for choosing me and [facility].  Pricilla Riffle. Dagoberto Ligas., MD., Marval Regal

## 2017-02-04 NOTE — Progress Notes (Signed)
    History of Present Illness: This is a 61 year old male returning for follow-up of GERD. His symptoms are well controlled on pantoprazole 20 mg daily. He has no gastrointestinal complaints. He underwent colonoscopy for adenomatous polyp surveillance in January 2017 and the colonoscopy showed small internal hemorrhoids and was otherwise normal.  Current Medications, Allergies, Past Medical History, Past Surgical History, Family History and Social History were reviewed in [employer] record.  Physical Exam: General: Well developed, well nourished, no acute distress Head: Normocephalic and atraumatic Eyes:  sclerae anicteric, EOMI Ears: Normal auditory acuity Mouth: No deformity or lesions Lungs: Clear throughout to auscultation Heart: Regular rate and rhythm; no murmurs, rubs or bruits Abdomen: Soft, non tender and non distended. No masses, hepatosplenomegaly or hernias noted. Normal Bowel sounds Musculoskeletal: Symmetrical with no gross deformities  Pulses:  Normal pulses noted Extremities: No clubbing, cyanosis, edema or deformities noted Neurological: Alert oriented x 4, grossly nonfocal Psychological:  Alert and cooperative. Normal mood and affect  Assessment and Recommendations:  1. GERD with history of esophagitis. I'll all antireflux measures. Continue pantoprazole 20 mg daily. REV in 1 year.  2. Personal history of adenomatous colon polyps. Five-year interval surveillance colonoscopy is due in January 2022.

## 2017-04-05 ENCOUNTER — Other Ambulatory Visit: Payer: Self-pay | Admitting: Family Medicine

## 2017-04-20 ENCOUNTER — Ambulatory Visit (INDEPENDENT_AMBULATORY_CARE_PROVIDER_SITE_OTHER): Payer: Commercial Managed Care - PPO | Admitting: Family Medicine

## 2017-04-20 DIAGNOSIS — R351 Nocturia: Secondary | ICD-10-CM | POA: Diagnosis not present

## 2017-04-20 DIAGNOSIS — N401 Enlarged prostate with lower urinary tract symptoms: Secondary | ICD-10-CM

## 2017-04-20 DIAGNOSIS — E119 Type 2 diabetes mellitus without complications: Secondary | ICD-10-CM | POA: Diagnosis not present

## 2017-04-20 DIAGNOSIS — E7800 Pure hypercholesterolemia, unspecified: Secondary | ICD-10-CM

## 2017-04-20 LAB — COMPREHENSIVE METABOLIC PANEL
ALBUMIN: 4.2 g/dL (ref 3.5–5.2)
ALT: 26 U/L (ref 0–53)
AST: 19 U/L (ref 0–37)
Alkaline Phosphatase: 47 U/L (ref 39–117)
BILIRUBIN TOTAL: 0.4 mg/dL (ref 0.2–1.2)
BUN: 20 mg/dL (ref 6–23)
CALCIUM: 9.1 mg/dL (ref 8.4–10.5)
CHLORIDE: 104 meq/L (ref 96–112)
CO2: 30 meq/L (ref 19–32)
CREATININE: 0.95 mg/dL (ref 0.40–1.50)
GFR: 86.11 mL/min (ref >60.00)
Glucose, Bld: 100 mg/dL — ABNORMAL HIGH (ref 70–99)
Potassium: 4.3 mEq/L (ref 3.5–5.1)
Sodium: 140 mEq/L (ref 135–145)
Total Protein: 6.2 g/dL (ref 6.0–8.3)

## 2017-04-20 LAB — POCT UA - MICROALBUMIN
Albumin/Creatinine Ratio, Urine, POC: <30
CREATININE, POC: 200 mg/dL
MICROALBUMIN (UR) POC: 10 mg/L

## 2017-04-20 LAB — PSA: PSA: 1.04 ng/mL (ref 0.10–4.00)

## 2017-04-20 LAB — LIPID PANEL
CHOL/HDL RATIO: 4
Cholesterol: 137 mg/dL (ref 0–200)
HDL: 38.7 mg/dL — ABNORMAL LOW (ref >39.00)
LDL CALC: 65 mg/dL (ref 0–99)
NonHDL: 98.79
Triglycerides: 167 mg/dL — ABNORMAL HIGH (ref 0.0–149.0)
VLDL: 33.4 mg/dL (ref 0.0–40.0)

## 2017-04-20 LAB — HEMOGLOBIN A1C: Hgb A1c MFr Bld: 6.5 % (ref 4.6–6.5)

## 2017-04-20 LAB — CBC
HCT: 37.1 % — ABNORMAL LOW (ref 39.0–52.0)
Hemoglobin: 12.3 g/dL — ABNORMAL LOW (ref 13.0–17.0)
MCHC: 33.2 g/dL (ref 30.0–36.0)
MCV: 85.5 fl (ref 78.0–100.0)
PLATELETS: 200 10*3/uL (ref 150.0–400.0)
RBC: 4.34 Mil/uL (ref 4.22–5.81)
RDW: 15.9 % — ABNORMAL HIGH (ref 11.5–15.5)
WBC: 4.6 10*3/uL (ref 4.0–10.5)

## 2017-04-21 NOTE — Progress Notes (Signed)
Lab orders

## 2017-04-27 ENCOUNTER — Ambulatory Visit (INDEPENDENT_AMBULATORY_CARE_PROVIDER_SITE_OTHER): Payer: Commercial Managed Care - PPO | Admitting: Family Medicine

## 2017-04-27 ENCOUNTER — Encounter: Payer: Self-pay | Admitting: Family Medicine

## 2017-04-27 VITALS — BP 124/70 | HR 61 | Temp 97.8°F | Ht 71.75 in | Wt 191.2 lb

## 2017-04-27 DIAGNOSIS — Z0001 Encounter for general adult medical examination with abnormal findings: Secondary | ICD-10-CM | POA: Diagnosis not present

## 2017-04-27 DIAGNOSIS — E119 Type 2 diabetes mellitus without complications: Secondary | ICD-10-CM | POA: Diagnosis not present

## 2017-04-27 DIAGNOSIS — D649 Anemia, unspecified: Secondary | ICD-10-CM | POA: Diagnosis not present

## 2017-04-27 MED ORDER — ATORVASTATIN CALCIUM 40 MG PO TABS: 40.0000 mg | ORAL_TABLET | Freq: Every day | ORAL | 11 refills | Status: DC

## 2017-04-27 MED ORDER — PANTOPRAZOLE SODIUM 20 MG PO TBEC: 20.0000 mg | DELAYED_RELEASE_TABLET | Freq: Every day | ORAL | 11 refills | Status: DC

## 2017-04-27 MED ORDER — CLOPIDOGREL BISULFATE 75 MG PO TABS: 75.0000 mg | ORAL_TABLET | Freq: Every day | ORAL | 11 refills | Status: DC

## 2017-04-27 MED ORDER — TAMSULOSIN HCL 0.4 MG PO CAPS: 0.4000 mg | ORAL_CAPSULE | Freq: Every day | ORAL | 6 refills | Status: DC

## 2017-04-27 MED ORDER — METFORMIN HCL 500 MG PO TABS: ORAL_TABLET | ORAL | 11 refills | Status: DC

## 2017-04-27 MED ORDER — PAROXETINE HCL 20 MG PO TABS: 20.0000 mg | ORAL_TABLET | Freq: Every day | ORAL | 11 refills | Status: DC

## 2017-04-27 NOTE — Patient Instructions (Addendum)
If you have not had an eye exam within a year, please get one at this time as this is important for your diabetes care. Have them send [place] a copy  Schedule lab visit in 1-2 months to make sure anemia not worsening. Can increase iron in diet. If you note blood in your stool, dark black stool, or other source of bleeding let us know immediately

## 2017-04-27 NOTE — Progress Notes (Signed)
Pre visit review using our clinic review tool, if applicable. No additional management support is needed unless otherwise documented below in the visit note. 

## 2017-04-27 NOTE — Assessment & Plan Note (Signed)
DM metformin 500 controlling Lab Results  Component Value Date   HGBA1C 6.5 04/20/2017

## 2017-04-27 NOTE — Progress Notes (Signed)
Phone: [phone]  Subjective:  Patient presents today for their annual physical. Chief complaint-noted.   See problem oriented charting- ROS- full  review of systems was completed and negative except for: nocturia- does drink a lot of fluids in the daytime  The following were reviewed and entered/updated in epic: Past Medical History:  Diagnosis Date  . Allergy   . Anxiety   . Arthritis   . Depression (disease)   . Erosive esophagitis   . GERD (gastroesophageal reflux disease)   . Hemorrhoids   . History of cerebrovascular accident   . Hyperlipidemia   . Hypertension   . Stroke ([place])    2001  . Tubular adenoma polyp of rectum 07/2010   Patient Active Problem List   Diagnosis Date Noted  . Type II diabetes mellitus, well controlled ([place]) 12/11/2014    Priority: High  . History of CVA (cerebrovascular accident) 07/26/2007    Priority: High  . BPH associated with nocturia 04/24/2016    Priority: Medium  . Depression 08/21/2008    Priority: Medium  . Hyperlipidemia 07/26/2007    Priority: Medium  . History of adenomatous polyp of colon 04/24/2016    Priority: Low  . GERD 07/26/2007    Priority: Low   Past Surgical History:  Procedure Laterality Date  . colon polyp removal  1967  . COLONOSCOPY      Family History  Problem Relation Age of Onset  . Parkinson's disease Mother   . Coronary artery disease Father     MI 57, infection related potentially  . Heart disease Maternal Grandfather     unknown age, MI at some age  . Diabetes Maternal Grandmother   . Colon cancer Neg Hx   . Esophageal cancer Neg Hx   . Stomach cancer Neg Hx   . Rectal cancer Neg Hx     Medications- reviewed and updated Current Outpatient Prescriptions  Medication Sig Dispense Refill  . aspirin 81 MG tablet Take 81 mg by mouth daily.    Marland Kitchen atorvastatin (LIPITOR) 40 MG tablet take 1 tablet by mouth once daily 30 tablet 5  . clopidogrel (PLAVIX) 75 MG tablet take 1 tablet by mouth once  daily 30 tablet 5  . fish oil-omega-3 fatty acids 1000 MG capsule Take 3 g by mouth daily.      . metFORMIN (GLUCOPHAGE) 500 MG tablet take 1 tablet by mouth once daily with BREAKFAST 90 tablet 3  . niacin 500 MG tablet Take 500 mg by mouth. 2 po at bedtime      . pantoprazole (PROTONIX) 20 MG tablet Take 1 tablet (20 mg total) by mouth daily. 30 tablet 11  . PARoxetine (PAXIL) 20 MG tablet take 1 tablet by mouth once daily 30 tablet 5  . tamsulosin (FLOMAX) 0.4 MG CAPS capsule Take 1 capsule (0.4 mg total) by mouth daily. 30 capsule 6   No current facility-administered medications for this visit.     Allergies-reviewed and updated No Known Allergies  Social History   Social History  . Marital status: Married    Spouse name: N/A  . Number of children: 2  . Years of education: N/A   Occupational History  . pace Loss adjuster, chartered   Social History Main Topics  . Smoking status: Never Smoker  . Smokeless tobacco: Never Used  . Alcohol use No  . Drug use: No  . Sexual activity: Not Asked   Other Topics Concern  . None   Social History Narrative  Married (spouse at outside practice), 2 sons in college in 2016.       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 Oropharynx normal Neck: no thyromegaly CV: RRR no murmurs rubs or gallops Lungs: CTAB no crackles, wheeze, rhonchi Abdomen: soft/nontender/nondistended/normal bowel sounds. No rebound or guarding.  Ext: no edema Skin: warm, dry Neuro: grossly normal, moves all  extremities, PERRLA Rectal: normal tone, diffusely enlarged prostate, no masses or tenderness  Diabetic Foot Exam - Simple   Simple Foot Form Diabetic Foot exam was performed with the following findings:  Yes 04/27/2017  8:18 AM  Visual Inspection No deformities, no ulcerations, no other skin breakdown bilaterally:  Yes Sensation Testing Intact to touch and monofilament testing bilaterally:  Yes Pulse Check Posterior Tibialis and Dorsalis pulse intact bilaterally:  Yes Comments    Assessment/Plan:  61 y.o. male presenting for annual physical.  Health Maintenance counseling: 1. Anticipatory guidance: Patient counseled regarding regular dental exams q6 months, eye exams - needs to update, wearing seatbelts.  2. Risk factor reduction:  Advised patient of need for regular exercise and diet rich and fruits and vegetables to reduce risk of heart attack and stroke. Exercise- last year running 12-15 miles a week and working out 3-5x a week- other than doing this on ellipitical last 6 weeks due to knee. Diet-balanced diet.  Wt Readings from Last 3 Encounters:  04/27/17 191 lb 3.2 oz (86.7 kg)  02/04/17 194 lb (88 kg)  10/23/16 187 lb (84.8 kg)   3. Immunizations/screenings/ancillary studies- declines shingles for now Immunization History  Administered Date(s) Administered  . Influenza Whole 10/17/2009, 09/28/2012  . Influenza-Unspecified 10/12/2014, 10/09/2016  . Td 12/29/1997, 01/27/2008   Health Maintenance Due  Topic Date Due  . OPHTHALMOLOGY EXAM  11/05/1967   4. Prostate cancer screening- low risk rectal exam other than BPH and PSA trend . Last year nocturia 2-3x a night- started flomax. Stream stronger but nocturia persists. May try to stop fluids earlier in the evening.  Lab Results  Component Value Date   PSA 1.04 04/20/2017   PSA 1.01 04/17/2016   PSA 1.03 04/16/2015   5. Colon cancer screening - 01/15/16 with 5 year follow up 6. Skin cancer screening- waist up exam today.   No obvious worrisome lesions in this distribution. A few seborrheic keratosis and benign appearing nevi noted. Advised regular sunscreen use- he is good at this and wearing height  Status of chronic or acute concerns   Hx CVA- on aspirin 81mg  and plavix. No residual effects  Hyperlipidemia- LDL <70 on atorvastatin 40mg  and fish oil.   Depression- controlled on paxil 20mg  with PHQ2 of 0. No si.   GERD- erosive esophagitis history so remains on PPI  ENT refer last year- tympanostomy tube placed. No swimming in ocean  R knee 6 wks ago slight lateral pain- better for last 4 months.   Anemia- no blood in stool. Has gone more plant based and eating less  iron. 1-2 month recheck with iron included. Up to date on colonoscopy.   Return in about 6 months (around 10/27/2017) for follow up- or sooner if needed.  Orders Placed This Encounter  Procedures  . CBC    Standing Status:   Future    Standing Expiration Date:   04/27/2018  . Ferritin    Standing Status:   Future    Standing Expiration Date:   04/27/2018   Return precautions advised.   Garret Reddish, MD

## 2017-05-28 ENCOUNTER — Other Ambulatory Visit (INDEPENDENT_AMBULATORY_CARE_PROVIDER_SITE_OTHER): Payer: Commercial Managed Care - PPO

## 2017-05-28 DIAGNOSIS — D649 Anemia, unspecified: Secondary | ICD-10-CM

## 2017-05-28 LAB — CBC
HCT: 39.7 % (ref 39.0–52.0)
HEMOGLOBIN: 13.1 g/dL (ref 13.0–17.0)
MCHC: 33 g/dL (ref 30.0–36.0)
MCV: 84.8 fl (ref 78.0–100.0)
PLATELETS: 214 10*3/uL (ref 150.0–400.0)
RBC: 4.69 Mil/uL (ref 4.22–5.81)
RDW: 15.5 % (ref 11.5–15.5)
WBC: 5.2 10*3/uL (ref 4.0–10.5)

## 2017-05-28 LAB — FERRITIN: Ferritin: 5.3 ng/mL — ABNORMAL LOW (ref 22.0–322.0)

## 2017-08-11 DIAGNOSIS — M25562 Pain in left knee: Secondary | ICD-10-CM | POA: Diagnosis not present

## 2017-10-21 DIAGNOSIS — Z9622 Myringotomy tube(s) status: Secondary | ICD-10-CM | POA: Diagnosis not present

## 2017-10-21 DIAGNOSIS — H6522 Chronic serous otitis media, left ear: Secondary | ICD-10-CM | POA: Diagnosis not present

## 2017-10-21 DIAGNOSIS — H9212 Otorrhea, left ear: Secondary | ICD-10-CM | POA: Diagnosis not present

## 2017-10-27 ENCOUNTER — Ambulatory Visit: Payer: Commercial Managed Care - PPO | Admitting: Family Medicine

## 2017-11-05 ENCOUNTER — Encounter: Payer: Self-pay | Admitting: Family Medicine

## 2017-11-05 ENCOUNTER — Ambulatory Visit (INDEPENDENT_AMBULATORY_CARE_PROVIDER_SITE_OTHER): Payer: Commercial Managed Care - PPO | Admitting: Family Medicine

## 2017-11-05 VITALS — BP 122/70 | HR 64 | Temp 98.1°F | Ht 71.75 in | Wt 199.4 lb

## 2017-11-05 DIAGNOSIS — Z8673 Personal history of transient ischemic attack (TIA), and cerebral infarction without residual deficits: Secondary | ICD-10-CM | POA: Diagnosis not present

## 2017-11-05 DIAGNOSIS — F3342 Major depressive disorder, recurrent, in full remission: Secondary | ICD-10-CM

## 2017-11-05 DIAGNOSIS — M9420 Chondromalacia, unspecified site: Secondary | ICD-10-CM | POA: Diagnosis not present

## 2017-11-05 DIAGNOSIS — E785 Hyperlipidemia, unspecified: Secondary | ICD-10-CM | POA: Diagnosis not present

## 2017-11-05 DIAGNOSIS — E119 Type 2 diabetes mellitus without complications: Secondary | ICD-10-CM

## 2017-11-05 DIAGNOSIS — D509 Iron deficiency anemia, unspecified: Secondary | ICD-10-CM | POA: Diagnosis not present

## 2017-11-05 NOTE — Patient Instructions (Addendum)
will do home exercise program through sports med advisor 3x a week. Aspercreme/icing. C  4 weeks relative rest then can try to run perhaps quarter mile. If not making progress in 6 weeks likely refer to sports medicine  Please stop by lab before you go

## 2017-11-05 NOTE — Assessment & Plan Note (Signed)
History of CVA- remains on aspirin and plavix. Likely could be on plavix alone. Would like to use nsaids for knee but with plavix asa and prior anemia will resolve

## 2017-11-05 NOTE — Progress Notes (Signed)
Subjective:  Darrell Gaines is a 61 y.o. year old very pleasant male patient who presents for/with See problem oriented charting ROS-  Continued right knee pain. No hypoglycemia. No chest pain or shortness of breath   Past Medical History-  Patient Active Problem List   Diagnosis Date Noted  . Type II diabetes mellitus, well controlled ([place]) 12/11/2014    Priority: High  . History of CVA (cerebrovascular accident) 07/26/2007    Priority: High  . BPH associated with nocturia 04/24/2016    Priority: Medium  . Depression 08/21/2008    Priority: Medium  . Hyperlipidemia 07/26/2007    Priority: Medium  . History of adenomatous polyp of colon 04/24/2016    Priority: Low  . GERD 07/26/2007    Priority: Low  . Chondromalacia 11/05/2017    Medications- reviewed and updated Current Outpatient Medications  Medication Sig Dispense Refill  . aspirin 81 MG tablet Take 81 mg by mouth daily.    Marland Kitchen atorvastatin (LIPITOR) 40 MG tablet Take 1 tablet (40 mg total) by mouth daily. 30 tablet 11  . clopidogrel (PLAVIX) 75 MG tablet Take 1 tablet (75 mg total) by mouth daily. 30 tablet 11  . fish oil-omega-3 fatty acids 1000 MG capsule Take 3 g by mouth daily.      . metFORMIN (GLUCOPHAGE) 500 MG tablet take 1 tablet by mouth once daily with BREAKFAST 30 tablet 11  . niacin 500 MG tablet Take 500 mg by mouth. 2 po at bedtime      . pantoprazole (PROTONIX) 20 MG tablet Take 1 tablet (20 mg total) by mouth daily. 30 tablet 11  . PARoxetine (PAXIL) 20 MG tablet Take 1 tablet (20 mg total) by mouth daily. 30 tablet 11  . tamsulosin (FLOMAX) 0.4 MG CAPS capsule Take 1 capsule (0.4 mg total) by mouth daily. 30 capsule 6   No current facility-administered medications for this visit.     Objective: BP 122/70 (BP Location: Left Arm, Patient Position: Sitting, Cuff Size: Large)   Pulse 64   Temp 98.1 F (36.7 C) (Oral)   Ht 5' 11.75" (1.822 m)   Wt 199 lb 6.4 oz (90.4 kg)   SpO2 96%   BMI 27.23 kg/m   Gen: NAD, resting comfortably CV: RRR no murmurs rubs or gallops Lungs: CTAB no crackles, wheeze, rhonchi Skin: warm, dry MSK: see below  Assessment/Plan:  PPI due to history erosive esophagitis Anemia- less iron in diet. Up to date on colonoscopy. Ferritin was at 5.3- advised daily iron. He is on this- update today  History of CVA (cerebrovascular accident) History of CVA- remains on aspirin and plavix. Likely could be on plavix alone. Would like to use nsaids for knee but with plavix asa and prior anemia will resolve  Type II diabetes mellitus, well controlled ([place]) S:  controlled. On metformin 500mg  daily in past Exercise and diet- unfortunately weight up 8 lbs. Reasonable diet. Exercise has dropped significantly with knee bothering him- had been running up to 15 miles a day.  Lab Results  Component Value Date   HGBA1C 6.5 04/20/2017   HGBA1C 6.3 10/16/2016   HGBA1C 6.5 04/17/2016   A/P: update a1c today, hopefully stable with weight gain   Depression S: depression remains controlled on paxil 20mg  with phq2 of 0. Denies SI. Recurrent- full remission now.  A/P: continue current medications  Hyperlipidemia S:  controlled on last check on atorvastatin 40mg  and fish oil with LDL 65. No myalgias.  Lab Results  Component Value Date   CHOL 137 04/20/2017   HDL 38.70 (L) 04/20/2017   LDLCALC 65 04/20/2017   LDLDIRECT 88.0 11/28/2013   TRIG 167.0 (H) 04/20/2017   CHOLHDL 4 04/20/2017   A/P: continue current medications  Chondromalacia S:Intermittent pain lateral right knee for at least a year. Slipped when hiking and tweaked Right knee- has been ongoing issue for him- but eventually calmed down.  Went to see [employer] after pain worsened in summertime when running- was told he had runners knee/chondromalacia and told 1. Conservative care 2. Injection 3. MRI. He chose conservaive care. Has been icing it every night and has improved. Went running 1.5 weeks ago and flared up  again- did 3 miles on dirt. ellipitical doesn't bother it.  He really wants to get back to running.  O: Right knee Normal to inspection with no erythema or effusion or obvious bony abnormalities. Palpation normal with no warmth or joint line tenderness or patellar tenderness or condyle tenderness. Some crepitus.  ROM normal in flexion and extension and lower leg rotation. Ligaments with solid consistent endpoints including ACL, PCL, LCL, MCL. Negative Mcmurray's.  Patellar and quadriceps tendons unremarkable. Hamstring and quadriceps strength is normal. A/P: will do home exercise program through sports med advisor 3x a week. Aspercreme/icing. Considered topical nsaid and likely no issue but with prior low ferritin hold off at least until labs or sports medicine visit. 4 weeks relative rest then can try to run perhaps quarter mile. If not making progress in 6 weeks likely refer to sports medicine  6 months CPE  Orders Placed This Encounter  Procedures  . CBC    [place]  . Basic metabolic panel    [place]  . Hemoglobin A1c      . Iron, TIBC and Ferritin Panel   Return precautions advised.  Garret Reddish, MD

## 2017-11-05 NOTE — Assessment & Plan Note (Signed)
S:Intermittent pain lateral right knee for at least a year. Slipped when hiking and tweaked Right knee- has been ongoing issue for him- but eventually calmed down.  Went to see [employer] after pain worsened in summertime when running- was told he had runners knee/chondromalacia and told 1. Conservative care 2. Injection 3. MRI. He chose conservaive care. Has been icing it every night and has improved. Went running 1.5 weeks ago and flared up again- did 3 miles on dirt. ellipitical doesn't bother it.  He really wants to get back to running.  O: Right knee Normal to inspection with no erythema or effusion or obvious bony abnormalities. Palpation normal with no warmth or joint line tenderness or patellar tenderness or condyle tenderness. Some crepitus.  ROM normal in flexion and extension and lower leg rotation. Ligaments with solid consistent endpoints including ACL, PCL, LCL, MCL. Negative Mcmurray's.  Patellar and quadriceps tendons unremarkable. Hamstring and quadriceps strength is normal. A/P: will do home exercise program through sports med advisor 3x a week. Aspercreme/icing. Considered topical nsaid and likely no issue but with prior low ferritin hold off at least until labs or sports medicine visit. 4 weeks relative rest then can try to run perhaps quarter mile. If not making progress in 6 weeks likely refer to sports medicine

## 2017-11-05 NOTE — Assessment & Plan Note (Addendum)
S: depression remains controlled on paxil 20mg  with phq2 of 0. Denies SI. Recurrent- full remission now.  A/P: continue current medications

## 2017-11-05 NOTE — Assessment & Plan Note (Signed)
S:  controlled. On metformin 500mg  daily in past Exercise and diet- unfortunately weight up 8 lbs. Reasonable diet. Exercise has dropped significantly with knee bothering him- had been running up to 15 miles a day.  Lab Results  Component Value Date   HGBA1C 6.5 04/20/2017   HGBA1C 6.3 10/16/2016   HGBA1C 6.5 04/17/2016   A/P: update a1c today, hopefully stable with weight gain

## 2017-11-05 NOTE — Assessment & Plan Note (Signed)
S:  controlled on last check on atorvastatin 40mg  and fish oil with LDL 65. No myalgias.  Lab Results  Component Value Date   CHOL 137 04/20/2017   HDL 38.70 (L) 04/20/2017   LDLCALC 65 04/20/2017   LDLDIRECT 88.0 11/28/2013   TRIG 167.0 (H) 04/20/2017   CHOLHDL 4 04/20/2017   A/P: continue current medications

## 2017-11-05 NOTE — Addendum Note (Signed)
Addended by: Kayren Eaves T on: 11/05/2017 12:18 PM   Modules accepted: Orders

## 2017-12-25 ENCOUNTER — Other Ambulatory Visit: Payer: Self-pay

## 2017-12-25 MED ORDER — TAMSULOSIN HCL 0.4 MG PO CAPS: 0.4000 mg | ORAL_CAPSULE | Freq: Every day | ORAL | 6 refills | Status: DC

## 2018-01-06 LAB — HM DIABETES EYE EXAM

## 2018-01-07 ENCOUNTER — Ambulatory Visit (INDEPENDENT_AMBULATORY_CARE_PROVIDER_SITE_OTHER): Payer: Commercial Managed Care - PPO | Admitting: Family Medicine

## 2018-01-07 ENCOUNTER — Encounter: Payer: Self-pay | Admitting: Family Medicine

## 2018-01-07 VITALS — BP 124/82 | HR 65 | Temp 98.4°F | Ht 71.75 in | Wt 195.2 lb

## 2018-01-07 DIAGNOSIS — G459 Transient cerebral ischemic attack, unspecified: Secondary | ICD-10-CM

## 2018-01-07 DIAGNOSIS — D509 Iron deficiency anemia, unspecified: Secondary | ICD-10-CM | POA: Diagnosis not present

## 2018-01-07 DIAGNOSIS — E785 Hyperlipidemia, unspecified: Secondary | ICD-10-CM

## 2018-01-07 DIAGNOSIS — E119 Type 2 diabetes mellitus without complications: Secondary | ICD-10-CM

## 2018-01-07 DIAGNOSIS — H539 Unspecified visual disturbance: Secondary | ICD-10-CM

## 2018-01-07 LAB — BASIC METABOLIC PANEL
BUN: 14 mg/dL (ref 6–23)
CHLORIDE: 101 meq/L (ref 96–112)
CO2: 27 mEq/L (ref 19–32)
Calcium: 9.6 mg/dL (ref 8.4–10.5)
Creatinine, Ser: 1.04 mg/dL (ref 0.40–1.50)
GFR: 77.38 mL/min (ref >60.00)
GLUCOSE: 98 mg/dL (ref 70–99)
POTASSIUM: 4.3 meq/L (ref 3.5–5.1)
Sodium: 138 mEq/L (ref 135–145)

## 2018-01-07 LAB — IRON: IRON: 74 ug/dL (ref 42–165)

## 2018-01-07 LAB — FERRITIN: Ferritin: 40.8 ng/mL (ref 22.0–322.0)

## 2018-01-07 LAB — LDL CHOLESTEROL, DIRECT: Direct LDL: 82 mg/dL

## 2018-01-07 NOTE — Assessment & Plan Note (Signed)
S: well controlled. On metformin 500mg  in the past CBGs- does not check but knows how- no meter Lab Results  Component Value Date   HGBA1C 6.5 04/20/2017   HGBA1C 6.3 10/16/2016   HGBA1C 6.5 04/17/2016   A/P: planned for a1c today but did not tell lab to release prior labs. He will come back tomorrow for a1c. Also he will check to make sure cbg not low or high with episodes of blurry vision

## 2018-01-07 NOTE — Progress Notes (Signed)
Subjective:  Darrell Gaines is a 62 y.o. year old very pleasant male patient who presents for/with See problem oriented charting ROS- No facial weakness. Had slight right leg weakness during period of visual change (most recent) No slurred words or trouble swallowing. Has had blurry vision but no double vision. No paresthesias. No confusion or word finding difficulties.   Past Medical History-  Patient Active Problem List   Diagnosis Date Noted  . Type II diabetes mellitus, well controlled ([place]) 12/11/2014    Priority: High  . History of CVA (cerebrovascular accident) 07/26/2007    Priority: High  . BPH associated with nocturia 04/24/2016    Priority: Medium  . Depression 08/21/2008    Priority: Medium  . Hyperlipidemia 07/26/2007    Priority: Medium  . Chondromalacia 11/05/2017    Priority: Low  . History of adenomatous polyp of colon 04/24/2016    Priority: Low  . GERD 07/26/2007    Priority: Low  . Visual disturbance 01/07/2018    Medications- reviewed and updated Current Outpatient Medications  Medication Sig Dispense Refill  . aspirin 81 MG tablet Take 81 mg by mouth daily.    Marland Kitchen atorvastatin (LIPITOR) 40 MG tablet Take 1 tablet (40 mg total) by mouth daily. 30 tablet 11  . clopidogrel (PLAVIX) 75 MG tablet Take 1 tablet (75 mg total) by mouth daily. 30 tablet 11  . ferrous sulfate 325 (65 FE) MG EC tablet Take 325 mg by mouth daily with breakfast.    . fish oil-omega-3 fatty acids 1000 MG capsule Take 3 g by mouth daily.      . metFORMIN (GLUCOPHAGE) 500 MG tablet take 1 tablet by mouth once daily with BREAKFAST 30 tablet 11  . niacin 500 MG tablet Take 500 mg by mouth. 2 po at bedtime      . pantoprazole (PROTONIX) 20 MG tablet Take 1 tablet (20 mg total) by mouth daily. 30 tablet 11  . PARoxetine (PAXIL) 20 MG tablet Take 1 tablet (20 mg total) by mouth daily. 30 tablet 11  . tamsulosin (FLOMAX) 0.4 MG CAPS capsule Take 1 capsule (0.4 mg total) by mouth daily. 30 capsule  6   Objective: BP 124/82 (BP Location: Left Arm, Patient Position: Sitting, Cuff Size: Large)   Pulse 65   Temp 98.4 F (36.9 C) (Oral)   Ht 5' 11.75" (1.822 m)   Wt 195 lb 3.2 oz (88.5 kg)   SpO2 98%   BMI 26.66 kg/m  Gen: NAD, resting comfortably CV: RRR no murmurs rubs or gallops Lungs: CTAB no crackles, wheeze, rhonchi Abdomen: soft/nontender/nondistended/normal bowel sounds.  Ext: no edema Skin: warm, dry, no rash Neuro: CN II-XII intact, sensation and reflexes normal throughout, 5/5 muscle strength in bilateral upper and lower extremities. Normal finger to nose. Normal rapid alternating movements. No pronator drift. Normal romberg. Normal gait.   Assessment/Plan:  Type II diabetes mellitus, well controlled ([place]) S: well controlled. On metformin 500mg  in the past CBGs- does not check but knows how- no meter Lab Results  Component Value Date   HGBA1C 6.5 04/20/2017   HGBA1C 6.3 10/16/2016   HGBA1C 6.5 04/17/2016   A/P: planned for a1c today but did not tell lab to release prior labs. He will come back tomorrow for a1c. Also he will check to make sure cbg not low or high with episodes of blurry vision   Visual disturbance Potential TIA S:  Has had 4 episodes of visual disturbances going back to November 2018. Episodes  are about 30 seconds. First event was along with being in shower at gym- stent in ear was full. Second one was with ear infection. Early December - felt like someone was shaking his computer screen. Over Christmas break, had another episode while crossing a street- felt like perhaps mildest of weakness on right leg (which concerned him given prior stroke) but also has bad knee. States simply hard to describe the vision changes. Feels like its in both eyes. Its like vision field goes up and down and gets blurry.   Other potential triggers per patient: Has not done a good job with drinking water- was tea and coffee heavy- changed after the new year- has not had  episode since.   [facility]- sending records. no issues per patient on that exam. They stated eyes looked good. No issues with vision with prior stroke- was all right sided weakness.   Has been on aspirin 81mg  and plavix- he went back to 325 in last few days.  A/P: Visual changes- with prior CVA in 2001 he is at high risk for TIA or stroke. Fortunately LDL at least below 100, diabetes has been controlled (will update) and compliant with aspirin and plavix. We opted to get MRI brain, echocardiogram (if clot- would need to change to atrial fibrillation prevention) as well as carotid dopplers. He will also make sure CBGs in normal range if has another episode. Strict return precautions given for urgent/emergent care. Could be dehydration related as well- though doubt- encouraged him to stay well hydrated.   Future Appointments  Date Time Provider Story  01/08/2018  8:30 AM [facility]  05/04/2018  8:15 AM Eshaal Duby, Brayton Mars, MD [facility]   Orders Placed This Encounter  Procedures  . MR Brain W Wo Contrast    Standing Status:   Future    Standing Expiration Date:   03/08/2019    Order Specific Question:   If indicated for the ordered procedure, I authorize the administration of contrast media per Radiology protocol    Answer:   Yes    Order Specific Question:   What is the patient's sedation requirement?    Answer:   No Sedation    Order Specific Question:   Does the patient have a pacemaker or implanted devices?    Answer:   No    Order Specific Question:   Radiology Contrast Protocol - do NOT remove file path    Answer:   [email or web address]    Order Specific Question:   Preferred imaging location?    Answer:   [facility] (table limit-350 lbs)  . LDL cholesterol, direct    [place]  . Iron  . Ferritin  . ECHOCARDIOGRAM COMPLETE    Standing Status:   Future    Standing Expiration Date:   04/09/2019    Order  Specific Question:   Where should this test be performed    Answer:   [facility])    Order Specific Question:   Does the patient weigh less than or greater than 250 lbs?    Answer:   Patient weighs less than 250 lbs    Order Specific Question:   Complete or Limited study?    Answer:   Complete    Order Specific Question:   With Image Enhancing Agent or without Image Enhancing Agent?    Answer:   With Image Enhancing Agent    Order Specific Question:   Reason  for exam-Echo    Answer:   TIA  435.9 / G45.9   Return precautions advised.  Garret Reddish, MD

## 2018-01-07 NOTE — Assessment & Plan Note (Addendum)
Potential TIA S:  Has had 4 episodes of visual disturbances going back to November 2018. Episodes are about 30 seconds. First event was along with being in shower at gym- stent in ear was full. Second one was with ear infection. Early December - felt like someone was shaking his computer screen. Over Christmas break, had another episode while crossing a street- felt like perhaps mildest of weakness on right leg (which concerned him given prior stroke) but also has bad knee. States simply hard to describe the vision changes. Feels like its in both eyes. Its like vision field goes up and down and gets blurry.   Other potential triggers per patient: Has not done a good job with drinking water- was tea and coffee heavy- changed after the new year- has not had episode since.   [facility]- sending records. no issues per patient on that exam. They stated eyes looked good. No issues with vision with prior stroke- was all right sided weakness.   Has been on aspirin 81mg  and plavix- he went back to 325 in last few days.  A/P: Visual changes- with prior CVA in 2001 he is at high risk for TIA or stroke. Fortunately LDL at least below 100, diabetes has been controlled (will update) and compliant with aspirin and plavix. We opted to get MRI brain, echocardiogram (if clot- would need to change to atrial fibrillation prevention) as well as carotid dopplers. He will also make sure CBGs in normal range if has another episode. Strict return precautions given for urgent/emergent care. Could be dehydration related as well- though doubt- encouraged him to stay well hydrated.

## 2018-01-07 NOTE — Patient Instructions (Addendum)
Please stop by lab before you go  We will call you within a week or two about your referral for MRI, echocardiogram, ultrasound of carotids. If you do not hear within 3 weeks, give [place] a call.   I doubt this is a blood sugar issue but please check your sugar during one of these episodes to make sure during 80-180  If you have prolonged issues with vision change call 911  Still taking aspirin, plavix, atorvastatin, metformin correct?

## 2018-01-08 ENCOUNTER — Other Ambulatory Visit (INDEPENDENT_AMBULATORY_CARE_PROVIDER_SITE_OTHER): Payer: Commercial Managed Care - PPO

## 2018-01-08 ENCOUNTER — Encounter: Payer: Self-pay | Admitting: Family Medicine

## 2018-01-08 DIAGNOSIS — E119 Type 2 diabetes mellitus without complications: Secondary | ICD-10-CM | POA: Diagnosis not present

## 2018-01-08 DIAGNOSIS — D509 Iron deficiency anemia, unspecified: Secondary | ICD-10-CM

## 2018-01-08 LAB — HEMOGLOBIN A1C: HEMOGLOBIN A1C: 6.1 % (ref 4.6–6.5)

## 2018-01-08 LAB — CBC
HEMATOCRIT: 44.8 % (ref 39.0–52.0)
Hemoglobin: 15 g/dL (ref 13.0–17.0)
MCHC: 33.5 g/dL (ref 30.0–36.0)
MCV: 92.1 fl (ref 78.0–100.0)
Platelets: 217 10*3/uL (ref 150.0–400.0)
RBC: 4.87 Mil/uL (ref 4.22–5.81)
RDW: 13.9 % (ref 11.5–15.5)
WBC: 5.4 10*3/uL (ref 4.0–10.5)

## 2018-01-12 ENCOUNTER — Ambulatory Visit (HOSPITAL_COMMUNITY)
Admission: RE | Admit: 2018-01-12 | Discharge: 2018-01-12 | Disposition: A | Discharge status: Home / Self Care | Payer: Commercial Managed Care - PPO | Source: Ambulatory Visit | Attending: Cardiovascular Disease | Admitting: Cardiovascular Disease

## 2018-01-12 DIAGNOSIS — H539 Unspecified visual disturbance: Secondary | ICD-10-CM | POA: Diagnosis not present

## 2018-01-12 DIAGNOSIS — Z8673 Personal history of transient ischemic attack (TIA), and cerebral infarction without residual deficits: Secondary | ICD-10-CM | POA: Insufficient documentation

## 2018-01-12 DIAGNOSIS — I708 Atherosclerosis of other arteries: Secondary | ICD-10-CM | POA: Diagnosis not present

## 2018-01-12 DIAGNOSIS — I6522 Occlusion and stenosis of left carotid artery: Secondary | ICD-10-CM | POA: Insufficient documentation

## 2018-01-12 DIAGNOSIS — G459 Transient cerebral ischemic attack, unspecified: Secondary | ICD-10-CM | POA: Diagnosis not present

## 2018-01-15 ENCOUNTER — Other Ambulatory Visit: Payer: Self-pay

## 2018-01-15 ENCOUNTER — Ambulatory Visit (HOSPITAL_COMMUNITY): Payer: Commercial Managed Care - PPO | Attending: Cardiology

## 2018-01-15 DIAGNOSIS — E119 Type 2 diabetes mellitus without complications: Secondary | ICD-10-CM | POA: Diagnosis not present

## 2018-01-15 DIAGNOSIS — G459 Transient cerebral ischemic attack, unspecified: Secondary | ICD-10-CM | POA: Diagnosis not present

## 2018-01-15 DIAGNOSIS — I10 Essential (primary) hypertension: Secondary | ICD-10-CM | POA: Diagnosis not present

## 2018-01-15 DIAGNOSIS — H539 Unspecified visual disturbance: Secondary | ICD-10-CM | POA: Diagnosis not present

## 2018-01-16 ENCOUNTER — Encounter: Payer: Self-pay | Admitting: Family Medicine

## 2018-01-18 ENCOUNTER — Other Ambulatory Visit: Payer: Self-pay

## 2018-01-18 DIAGNOSIS — I771 Stricture of artery: Secondary | ICD-10-CM

## 2018-01-26 NOTE — Progress Notes (Signed)
Referring-Stephen Kristian Covey MD Reason for referral-TIA  HPI: 62 year old male for evaluation of possible TIA at request of Marin Olp MD.  Echocardiogram January 2019 showed normal LV function and grade 1 diastolic dysfunction.  Carotid Dopplers January 2019 showed no significant stenosis.  There was right subclavian artery stenosis and aberrant flow noted in the right vertebral artery.  MRI of the brain February 01, 2018 showed remote lacunar infarcts of the basal ganglia and thalami bilaterally.  Patient had a CVA in 2001.  Apparently etiology was not determined.  Since November he has had intermittent visual disturbance.  These episodes last less than 30 seconds.  No associated palpitations, chest pain or dyspnea.  Resolved spontaneously.  His most recent episode in December was associated with question mild right lower extremity weakness.  Because of the above cardiology asked to evaluate.  Current Outpatient Medications  Medication Sig Dispense Refill  . aspirin 81 MG tablet Take 81 mg by mouth daily.    Marland Kitchen atorvastatin (LIPITOR) 40 MG tablet Take 1 tablet (40 mg total) by mouth daily. 30 tablet 11  . clopidogrel (PLAVIX) 75 MG tablet Take 1 tablet (75 mg total) by mouth daily. 30 tablet 11  . ferrous sulfate 325 (65 FE) MG EC tablet Take 325 mg by mouth daily with breakfast.    . fish oil-omega-3 fatty acids 1000 MG capsule Take 3 g by mouth daily.      . metFORMIN (GLUCOPHAGE) 500 MG tablet take 1 tablet by mouth once daily with BREAKFAST 30 tablet 11  . niacin 500 MG tablet Take 500 mg by mouth. 2 po at bedtime      . pantoprazole (PROTONIX) 20 MG tablet Take 1 tablet (20 mg total) by mouth daily. 30 tablet 11  . PARoxetine (PAXIL) 20 MG tablet Take 1 tablet (20 mg total) by mouth daily. 30 tablet 11  . tamsulosin (FLOMAX) 0.4 MG CAPS capsule Take 1 capsule (0.4 mg total) by mouth daily. 30 capsule 6   No current facility-administered medications for this visit.     No Known  Allergies   Past Medical History:  Diagnosis Date  . Allergy   . Anxiety   . Arthritis   . Depression (disease)   . Diabetes mellitus ([place])   . Erosive esophagitis   . GERD (gastroesophageal reflux disease)   . Hemorrhoids   . History of cerebrovascular accident   . Hyperlipidemia   . Stroke ([place])    2001  . Tubular adenoma polyp of rectum 07/2010    Past Surgical History:  Procedure Laterality Date  . colon polyp removal  1967  . COLONOSCOPY      Social History   Socioeconomic History  . Marital status: Married    Spouse name: Not on file  . Number of children: 2  . Years of education: Not on file  . Highest education level: Not on file  Social Needs  . Financial resource strain: Not on file  . Food insecurity - worry: Not on file  . Food insecurity - inability: Not on file  . Transportation needs - medical: Not on file  . Transportation needs - non-medical: Not on file  Occupational History  . Occupation: pace Physicist, medical: [employer]  Tobacco Use  . Smoking status: Never Smoker  . Smokeless tobacco: Never Used  Substance and Sexual Activity  . Alcohol use: Yes    Alcohol/week: 0.0 oz    Comment: Occasional  .  Drug use: No  . Sexual activity: Not on file  Other Topics Concern  . Not on file  Social History Narrative   Married (spouse at outside practice), 2 sons in college in 2016.       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 Parkinson's disease Mother   . Coronary artery disease Father   . Heart attack Father 100       infection related potentially  . Heart disease Maternal Grandfather        unknown age, MI at some age  . Heart attack Maternal Grandfather   . Diabetes Maternal Grandmother   . Colon cancer Neg Hx     . Esophageal cancer Neg Hx   . Stomach cancer Neg Hx   . Rectal cancer Neg Hx     ROS: no fevers or chills, productive cough, hemoptysis, dysphasia, odynophagia, melena, hematochezia, dysuria, hematuria, rash, seizure activity, orthopnea, PND, pedal edema, claudication. Remaining systems are negative.  Physical Exam:   Blood pressure 122/62, pulse 60, height 5\' 11"  (1.803 m), weight 194 lb (88 kg).  General:  Well developed/well nourished in NAD Skin warm/dry Patient not depressed No peripheral clubbing Back-normal HEENT-normal/normal eyelids Neck supple/normal carotid upstroke bilaterally; no bruits; no JVD; no thyromegaly chest - CTA/ normal expansion CV - RRR/normal S1 and S2; no murmurs, rubs or gallops;  PMI nondisplaced Abdomen -NT/ND, no HSM, no mass, + bowel sounds, no bruit 2+ femoral pulses, no bruits Ext-no edema, chords, 2+ DP Neuro-grossly nonfocal  ECG -normal sinus rhythm at a rate of 60.  Nonspecific inferior lateral T wave changes.  Personally reviewed  A/P  1 possible TIA-unclear if this is a true TIA.  I will ask neurology to review.  If they feel these episodes are TIAs we can arrange transesophageal echocardiogram to rule out source of embolus.  We could also consider an implantable loop monitor to screen for atrial fibrillation.  I am not convinced that right subclavian stenosis would contribute but will review with my colleagues.  Continue aspirin and Plavix.   2 hyperlipidemia-continue statin.  3 diabetes mellitus-management per primary care.  4 peripheral vascular disease with subclavian stenosis-no symptoms in right upper extremity.  We will review with [facility] colleagues concerning possible stealbcontributing to presenting symptoms.  Kirk Ruths, MD

## 2018-01-27 ENCOUNTER — Encounter: Payer: Self-pay | Admitting: *Deleted

## 2018-02-01 ENCOUNTER — Encounter: Payer: Self-pay | Admitting: Family Medicine

## 2018-02-01 ENCOUNTER — Ambulatory Visit (HOSPITAL_COMMUNITY)
Admission: RE | Admit: 2018-02-01 | Discharge: 2018-02-01 | Disposition: A | Discharge status: Home / Self Care | Payer: Commercial Managed Care - PPO | Source: Ambulatory Visit | Attending: Family Medicine | Admitting: Family Medicine

## 2018-02-01 DIAGNOSIS — G459 Transient cerebral ischemic attack, unspecified: Secondary | ICD-10-CM

## 2018-02-01 DIAGNOSIS — M2548 Effusion, other site: Secondary | ICD-10-CM | POA: Diagnosis not present

## 2018-02-01 DIAGNOSIS — H539 Unspecified visual disturbance: Secondary | ICD-10-CM | POA: Insufficient documentation

## 2018-02-01 DIAGNOSIS — I6381 Other cerebral infarction due to occlusion or stenosis of small artery: Secondary | ICD-10-CM | POA: Diagnosis not present

## 2018-02-01 DIAGNOSIS — I639 Cerebral infarction, unspecified: Secondary | ICD-10-CM | POA: Diagnosis not present

## 2018-02-01 MED ORDER — GADOBENATE DIMEGLUMINE 529 MG/ML IV SOLN
20.0000 mL | Freq: Once | INTRAVENOUS | Status: AC | PRN
  Administered 2018-02-01: 18 mL via INTRAVENOUS

## 2018-02-04 ENCOUNTER — Encounter: Payer: Self-pay | Admitting: Cardiology

## 2018-02-04 ENCOUNTER — Ambulatory Visit (INDEPENDENT_AMBULATORY_CARE_PROVIDER_SITE_OTHER): Payer: Commercial Managed Care - PPO | Admitting: Cardiology

## 2018-02-04 VITALS — BP 122/62 | HR 60 | Ht 71.0 in | Wt 194.0 lb

## 2018-02-04 DIAGNOSIS — E7800 Pure hypercholesterolemia, unspecified: Secondary | ICD-10-CM

## 2018-02-04 DIAGNOSIS — G459 Transient cerebral ischemic attack, unspecified: Secondary | ICD-10-CM | POA: Diagnosis not present

## 2018-02-04 DIAGNOSIS — I739 Peripheral vascular disease, unspecified: Secondary | ICD-10-CM | POA: Diagnosis not present

## 2018-02-04 NOTE — Patient Instructions (Signed)
Your physician recommends that you schedule a follow-up appointment in: [place] TIA

## 2018-02-05 ENCOUNTER — Encounter: Payer: Self-pay | Admitting: Neurology

## 2018-03-06 ENCOUNTER — Other Ambulatory Visit: Payer: Self-pay | Admitting: Gastroenterology

## 2018-03-20 ENCOUNTER — Other Ambulatory Visit: Payer: Self-pay | Admitting: Family Medicine

## 2018-03-26 ENCOUNTER — Encounter: Payer: Self-pay | Admitting: Neurology

## 2018-03-26 ENCOUNTER — Ambulatory Visit (INDEPENDENT_AMBULATORY_CARE_PROVIDER_SITE_OTHER): Payer: Commercial Managed Care - PPO | Admitting: Neurology

## 2018-03-26 VITALS — BP 120/80 | HR 60 | Ht 71.0 in | Wt 196.0 lb

## 2018-03-26 DIAGNOSIS — R42 Dizziness and giddiness: Secondary | ICD-10-CM

## 2018-03-26 DIAGNOSIS — I771 Stricture of artery: Secondary | ICD-10-CM | POA: Diagnosis not present

## 2018-03-26 DIAGNOSIS — G450 Vertebro-basilar artery syndrome: Secondary | ICD-10-CM

## 2018-03-26 NOTE — Patient Instructions (Addendum)
1. Schedule MRA head without contrast, MRA neck with and without contrast  We have sent a referral to [facility] for your MRA and they will call you directly to schedule your appt. They are located at [place]. If you need to contact them directly please call [phone].   2. Continue with increased fluid intake 3. If symptoms change, go to ER immediately 4. Continue all your medications 5. Follow-up in 6 months, call for any changes

## 2018-03-26 NOTE — Progress Notes (Signed)
NEUROLOGY CONSULTATION NOTE  Darrell Gaines MRN: [id number] DOB: 07/14/1956  Referring provider: Dr. Kirk Ruths Primary care provider: Dr. Garret Reddish  Reason for consult:  TIA  Dear Dr Stanford Breed:  Thank you for your kind referral of Darrell Gaines for consultation of the above symptoms. Although his history is well known to you, please allow me to reiterate it for the purpose of our medical record. Records and images were personally reviewed where available.  HISTORY OF PRESENT ILLNESS: This is a pleasant 62 year old left-handed man with a history of diabetes, hyperlipidemia, left subcortical stroke in 2001, right subclavian stenosis, presenting for evaluation of recurrent episodes of vision changes and dizziness over the past 4-5 months. He has had 4 episodes, last occurrence was in December 2018. He recalls the first 3 episodes occurred while he was sitting, for the first couple of times he had left ear trouble and attributed symptoms to this. He would have dizziness with a spinning quality, vision would be blurred because of the sensation of motion, and he would have a "short little blip of vision." He is able to talk, no dysarthria, confusion, drowsiness, headaches, nausea/vomiting, focal numbness/tingling. They have only lasted 30 seconds or so. The last episode occurred after he had worked out a few hours prior. With one episode before Christmas, he was walking and felt like his right leg was a little weak but it did not last long. He has a history of stroke in 2001 where he woke up with right-sided weakness and right facial droop, he was normal upon arrival to the ER. MRI brain showed a cluster of strokes in the left subcortical region (posterior limb of internal capsule, upper basal ganglia and corona radiata). Due to recent symptoms, he had an MRI brain with and without contrast in 01/2018 which did not show any acute changes. There chronic left subcortical strokes and posterior  inferior left cerebellum were seen. He had an echocardiogram which showed an EF of 38-25%, mild diastolic dysfunction. He had carotid dopplers which did not show significant stenosis, however the right subclavian artery was stenotic. He denies any symptoms on his right arm, he does not notice any arm pain, temperature changes, or sensory/motor symptoms on his right arm. His last LDL in April 2018 was 65. He is taking a statin, aspirin, and Plavix. His paternal aunts had strokes.   Laboratory Data: Lab Results  Component Value Date   [facility] 65 04/20/2017     PAST MEDICAL HISTORY: Past Medical History:  Diagnosis Date  . Allergy   . Anxiety   . Arthritis   . Depression (disease)   . Diabetes mellitus ([place])   . Erosive esophagitis   . GERD (gastroesophageal reflux disease)   . Hemorrhoids   . History of cerebrovascular accident   . Hyperlipidemia   . Stroke ([place])    2001  . Tubular adenoma polyp of rectum 07/2010    PAST SURGICAL HISTORY: Past Surgical History:  Procedure Laterality Date  . colon polyp removal  1967  . COLONOSCOPY      MEDICATIONS: Current Outpatient Medications on File Prior to Visit  Medication Sig Dispense Refill  . aspirin 325 MG EC tablet Take 325 mg by mouth daily.    Marland Kitchen atorvastatin (LIPITOR) 40 MG tablet Take 1 tablet (40 mg total) by mouth daily. 30 tablet 11  . clopidogrel (PLAVIX) 75 MG tablet Take 1 tablet (75 mg total) by mouth daily. 30 tablet 11  . ferrous  sulfate 325 (65 FE) MG EC tablet Take 325 mg by mouth daily with breakfast.    . fish oil-omega-3 fatty acids 1000 MG capsule Take 3 g by mouth daily.      . metFORMIN (GLUCOPHAGE) 500 MG tablet TAKE 1 TABLET BY MOUTH ONCE DAILY WITH BREAKFAST 90 tablet 0  . niacin 500 MG tablet Take 500 mg by mouth. 2 po at bedtime      . pantoprazole (PROTONIX) 20 MG tablet TAKE 1 TABLET BY MOUTH EVERY DAY 30 tablet 0  . PARoxetine (PAXIL) 20 MG tablet Take 1 tablet (20 mg total) by mouth daily. 30 tablet  11  . tamsulosin (FLOMAX) 0.4 MG CAPS capsule Take 1 capsule (0.4 mg total) by mouth daily. 30 capsule 6   No current facility-administered medications on file prior to visit.     ALLERGIES: No Known Allergies  FAMILY HISTORY: Family History  Problem Relation Age of Onset  . Parkinson's disease Mother   . Coronary artery disease Father   . Heart attack Father 39       infection related potentially  . Heart disease Maternal Grandfather        unknown age, MI at some age  . Heart attack Maternal Grandfather   . Diabetes Maternal Grandmother   . Colon cancer Neg Hx   . Esophageal cancer Neg Hx   . Stomach cancer Neg Hx   . Rectal cancer Neg Hx     SOCIAL HISTORY: Social History   Socioeconomic History  . Marital status: Married    Spouse name: Not on file  . Number of children: 2  . Years of education: Not on file  . Highest education level: Not on file  Occupational History  . Occupation: pace Physicist, medical: [employer]  Social Needs  . Financial resource strain: Not on file  . Food insecurity:    Worry: Not on file    Inability: Not on file  . Transportation needs:    Medical: Not on file    Non-medical: Not on file  Tobacco Use  . Smoking status: Never Smoker  . Smokeless tobacco: Never Used  Substance and Sexual Activity  . Alcohol use: Yes    Alcohol/week: 0.0 oz    Comment: Occasional  . Drug use: No  . Sexual activity: Not on file  Lifestyle  . Physical activity:    Days per week: Not on file    Minutes per session: Not on file  . Stress: Not on file  Relationships  . Social connections:    Talks on phone: Not on file    Gets together: Not on file    Attends religious service: Not on file    Active member of club or organization: Not on file    Attends meetings of clubs or organizations: Not on file    Relationship status: Not on file  . Intimate partner violence:    Fear of current or ex partner: Not on file    Emotionally  abused: Not on file    Physically abused: Not on file    Forced sexual activity: Not on file  Other Topics Concern  . Not on file  Social History Narrative   Married (spouse at outside practice), 2 sons in college in 2016.       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, in [place])      [place]: running, music, reading  Cell [phone]   [employer] release on 05-20-2010    REVIEW OF SYSTEMS: Constitutional: No fevers, chills, or sweats, no generalized fatigue, change in appetite Eyes: No visual changes, double vision, eye pain Ear, nose and throat: No hearing loss, ear pain, nasal congestion, sore throat Cardiovascular: No chest pain, palpitations Respiratory:  No shortness of breath at rest or with exertion, wheezes GastrointestinaI: No nausea, vomiting, diarrhea, abdominal pain, fecal incontinence Genitourinary:  No dysuria, urinary retention or frequency Musculoskeletal:  No neck pain, back pain Integumentary: No rash, pruritus, skin lesions Neurological: as above Psychiatric: No depression, insomnia, anxiety Endocrine: No palpitations, fatigue, diaphoresis, mood swings, change in appetite, change in weight, increased thirst Hematologic/Lymphatic:  No anemia, purpura, petechiae. Allergic/Immunologic: no itchy/runny eyes, nasal congestion, recent allergic reactions, rashes  PHYSICAL EXAM: Vitals:   03/26/18 1255  BP: 120/80  Pulse: 60  SpO2: 99%   General: No acute distress Head:  Normocephalic/atraumatic Eyes: Fundoscopic exam shows bilateral sharp discs, no vessel changes, exudates, or hemorrhages Neck: supple, no paraspinal tenderness, full range of motion Back: No paraspinal tenderness Heart: regular rate and rhythm Lungs: Clear to auscultation bilaterally. Vascular: No carotid bruits. Skin/Extremities: No rash, no edema Neurological Exam: Mental status: alert and oriented to person, place, and time, no  dysarthria or aphasia, Fund of knowledge is appropriate.  Recent and remote memory are intact.  Attention and concentration are normal.    Able to name objects and repeat phrases. Cranial nerves: CN I: not tested CN II: pupils equal, round and reactive to light, visual fields intact, fundi unremarkable. CN III, IV, VI:  full range of motion, no nystagmus, no ptosis CN V: facial sensation intact CN VII: upper and lower face symmetric CN VIII: hearing intact to finger rub CN IX, X: gag intact, uvula midline CN XI: sternocleidomastoid and trapezius muscles intact CN XII: tongue midline Bulk & Tone: normal, no fasciculations. Motor: 5/5 throughout with no pronator drift. Sensation: intact to light touch, cold, pin, vibration and joint position sense.  No extinction to double simultaneous stimulation.  Romberg test negative Deep Tendon Reflexes: +2 throughout, no ankle clonus Plantar responses: downgoing bilaterally Cerebellar: no incoordination on finger to nose, heel to shin. No dysdiadochokinesia Gait: narrow-based and steady, able to tandem walk adequately. Tremor: none  IMPRESSION: This is a pleasant 62 year old left-handed man with a history of diabetes, hyperlipidemia, left subcortical stroke in 2001, right subclavian stenosis, presenting for evaluation of recurrent transient episodes of vertigo and blurred vision over the past 4-5 months, none since December 2018. MRI brain did not show any acute changes. He has had a stroke workup with unremarkable TTE, no significant carotid stenosis, however note of right subclavian stenosis. Subclavian steal syndrome can present with vertigo and blurred vision, symptoms are concerning for this phenomenon. We discussed doing an MRA head and neck to further evaluate intracranial and extracranial vasculature for any other contributors to vertebrobasilar insufficiency. We discussed that management is typically antiplatelet therapy, aggressive lipid  management, and lifestyle modifications, including management of vascular risk factors such as diabetes and hypertension. Avoid dehydration/hypoperfusion. Invasive treatments are usually for highly symptomatic patients. He knows to go to the ER immediately for any sudden change in symptoms. He will follow-up in 6 months and knows to call for any changes.   Thank you for allowing me to participate in the care of this patient. Please do not hesitate to call for any questions or concerns.   Ellouise Newer, M.D.  CC: Dr. Yong Channel,  Dr. Stanford Breed

## 2018-04-06 ENCOUNTER — Ambulatory Visit: Payer: Commercial Managed Care - PPO | Admitting: Cardiology

## 2018-04-09 ENCOUNTER — Ambulatory Visit
Admission: RE | Admit: 2018-04-09 | Discharge: 2018-04-09 | Disposition: A | Discharge status: Home / Self Care | Payer: Commercial Managed Care - PPO | Source: Ambulatory Visit | Attending: Neurology | Admitting: Neurology

## 2018-04-09 ENCOUNTER — Encounter: Payer: Self-pay | Admitting: Neurology

## 2018-04-09 DIAGNOSIS — R42 Dizziness and giddiness: Secondary | ICD-10-CM

## 2018-04-09 DIAGNOSIS — I708 Atherosclerosis of other arteries: Secondary | ICD-10-CM | POA: Diagnosis not present

## 2018-04-09 DIAGNOSIS — G450 Vertebro-basilar artery syndrome: Secondary | ICD-10-CM

## 2018-04-09 DIAGNOSIS — I771 Stricture of artery: Secondary | ICD-10-CM

## 2018-04-09 DIAGNOSIS — I639 Cerebral infarction, unspecified: Secondary | ICD-10-CM | POA: Diagnosis not present

## 2018-04-09 MED ORDER — GADOBENATE DIMEGLUMINE 529 MG/ML IV SOLN
17.0000 mL | Freq: Once | INTRAVENOUS | Status: AC | PRN
  Administered 2018-04-09: 17 mL via INTRAVENOUS

## 2018-04-12 ENCOUNTER — Telehealth: Payer: Self-pay | Admitting: Neurology

## 2018-04-12 NOTE — Telephone Encounter (Signed)
Left VM on cell listed, asked patient to call back to discuss results.

## 2018-04-20 ENCOUNTER — Ambulatory Visit: Payer: Commercial Managed Care - PPO | Admitting: Neurology

## 2018-04-25 ENCOUNTER — Other Ambulatory Visit: Payer: Self-pay | Admitting: Family Medicine

## 2018-04-30 NOTE — Telephone Encounter (Signed)
Spoke to patient re: MRA results. There is atherosclerosis and 70% stenosis affecting the right subclavian artery, tandem moderate to severe distal right vertebral artery V4 segment stenosis both prox and distal to the right PICA, which remains patent, dominant left vertebral artery, moderate to severe atherosclerotic stenosis of the left PCA P3 but preserved distal flow. He has not had any further dizzy spells since December 2018. He does not have any right arm symptoms. Discussed intracranial atherosclerosis and maximum medical management, he is on dual antiplatelet, statin, also discussed avoiding hypotension, increasing fluid hydration. Discussed seeing a Vascular specialist, and we decided to hold off for now since he is asymptomatic, however he knows to go to the ER for any sudden changes in symptoms. F/u as scheduled.

## 2018-04-30 NOTE — Telephone Encounter (Signed)
Left VM for patient to call back

## 2018-04-30 NOTE — Telephone Encounter (Signed)
Patient wants to talk to someone about test results please call patient aback

## 2018-04-30 NOTE — Telephone Encounter (Signed)
Hi Dr. Delice Lesch  Patient returned your call. You were on another phone call at that time.   Thanks [employer]

## 2018-05-04 ENCOUNTER — Encounter: Payer: Self-pay | Admitting: Family Medicine

## 2018-05-04 ENCOUNTER — Ambulatory Visit (INDEPENDENT_AMBULATORY_CARE_PROVIDER_SITE_OTHER): Payer: Commercial Managed Care - PPO | Admitting: Family Medicine

## 2018-05-04 VITALS — BP 134/86 | HR 57 | Temp 97.5°F | Ht 71.0 in | Wt 192.8 lb

## 2018-05-04 DIAGNOSIS — Z125 Encounter for screening for malignant neoplasm of prostate: Secondary | ICD-10-CM | POA: Diagnosis not present

## 2018-05-04 DIAGNOSIS — R351 Nocturia: Secondary | ICD-10-CM | POA: Diagnosis not present

## 2018-05-04 DIAGNOSIS — Z23 Encounter for immunization: Secondary | ICD-10-CM | POA: Diagnosis not present

## 2018-05-04 DIAGNOSIS — E119 Type 2 diabetes mellitus without complications: Secondary | ICD-10-CM

## 2018-05-04 DIAGNOSIS — Z0000 Encounter for general adult medical examination without abnormal findings: Secondary | ICD-10-CM

## 2018-05-04 DIAGNOSIS — E785 Hyperlipidemia, unspecified: Secondary | ICD-10-CM

## 2018-05-04 DIAGNOSIS — I771 Stricture of artery: Secondary | ICD-10-CM

## 2018-05-04 DIAGNOSIS — N401 Enlarged prostate with lower urinary tract symptoms: Secondary | ICD-10-CM | POA: Diagnosis not present

## 2018-05-04 DIAGNOSIS — Z8673 Personal history of transient ischemic attack (TIA), and cerebral infarction without residual deficits: Secondary | ICD-10-CM | POA: Diagnosis not present

## 2018-05-04 LAB — CBC
HEMATOCRIT: 42.3 % (ref 39.0–52.0)
Hemoglobin: 14.6 g/dL (ref 13.0–17.0)
MCHC: 34.6 g/dL (ref 30.0–36.0)
MCV: 89.9 fl (ref 78.0–100.0)
Platelets: 193 10*3/uL (ref 150.0–400.0)
RBC: 4.7 Mil/uL (ref 4.22–5.81)
RDW: 13.7 % (ref 11.5–15.5)
WBC: 5.1 10*3/uL (ref 4.0–10.5)

## 2018-05-04 LAB — HEMOGLOBIN A1C: HEMOGLOBIN A1C: 6.3 % (ref 4.6–6.5)

## 2018-05-04 LAB — COMPREHENSIVE METABOLIC PANEL
ALBUMIN: 4.4 g/dL (ref 3.5–5.2)
ALK PHOS: 52 U/L (ref 39–117)
ALT: 45 U/L (ref 0–53)
AST: 24 U/L (ref 0–37)
BUN: 15 mg/dL (ref 6–23)
CALCIUM: 9.2 mg/dL (ref 8.4–10.5)
CO2: 30 mEq/L (ref 19–32)
CREATININE: 1 mg/dL (ref 0.40–1.50)
Chloride: 101 mEq/L (ref 96–112)
GFR: 80.88 mL/min (ref >60.00)
Glucose, Bld: 95 mg/dL (ref 70–99)
Potassium: 4.2 mEq/L (ref 3.5–5.1)
SODIUM: 139 meq/L (ref 135–145)
TOTAL PROTEIN: 6.7 g/dL (ref 6.0–8.3)
Total Bilirubin: 0.6 mg/dL (ref 0.2–1.2)

## 2018-05-04 LAB — PSA: PSA: 1.05 ng/mL (ref 0.10–4.00)

## 2018-05-04 LAB — LIPID PANEL
CHOL/HDL RATIO: 4
CHOLESTEROL: 143 mg/dL (ref 0–200)
HDL: 35.8 mg/dL — ABNORMAL LOW (ref >39.00)
LDL CALC: 73 mg/dL (ref 0–99)
NonHDL: 106.82
TRIGLYCERIDES: 168 mg/dL — AB (ref 0.0–149.0)
VLDL: 33.6 mg/dL (ref 0.0–40.0)

## 2018-05-04 LAB — MICROALBUMIN / CREATININE URINE RATIO
CREATININE, U: 68.9 mg/dL
Microalb Creat Ratio: 1 mg/g (ref 0.0–30.0)

## 2018-05-04 MED ORDER — RANITIDINE HCL 150 MG PO CAPS: 150.0000 mg | ORAL_CAPSULE | Freq: Two times a day (BID) | ORAL | 11 refills | Status: DC

## 2018-05-04 NOTE — Assessment & Plan Note (Signed)
No recurrence of dizziness or visual episodes since remaining well-hydrated.  He has discussed maximizing medical management with neurology.  We are going to try to push LDL under 70 if it is above that when checked today.  He asks about vascular surgery referral.  He asks about monitoring of the stenosis with imaging.  I told patient it would be reasonable to at least have one visit with vascular surgery to get their ideas on monitoring parameters and when to refer back to them at a very minimum-also told patient they may want to follow him regularly.

## 2018-05-04 NOTE — Assessment & Plan Note (Signed)
Hyperlipidemia- LDL has been below 100 at least on atorvastatin 40 mg and fish oil and niacin.  Update lipids today-last LDL was at 82- hopefully under 70 - if not would change to rosuvastatin 40mg 

## 2018-05-04 NOTE — Assessment & Plan Note (Signed)
History of CVA-remains on aspirin 81 mg and Plavix.  No residual effects.  We discussed could stop aspirin if ever had any bleeding issues.

## 2018-05-04 NOTE — Progress Notes (Signed)
Your CBC was normal (blood counts, infection fighting cells, platelets). Your CMET was normal (kidney, liver, and electrolytes, blood sugar).  Your cholesterol was mild poorly controlled.  Ideally we will get your bad cholesterol under 70 and your number was at 73.  Team- please increase his atorvastatin to 80 mg for daily use #90 with 3 refills. Your PSA trend was low risk for prostate cancer Your diabetes remains well controlled with A1c of 6.3.  The goal is for this to be under 7. Normal urine diabetes test-No leakage of protein into urine related to diabetes

## 2018-05-04 NOTE — Patient Instructions (Addendum)
Health Maintenance Due  Topic Date Due  . TETANUS/TDAP - Today at office visit 01/26/2018  . URINE MICROALBUMIN - Today at office visit 04/20/2018   Please stop by lab before you go   We will call you within a week or two about your referral to vascular surgery. If you do not hear within 3 weeks, give [place] a call.

## 2018-05-04 NOTE — Assessment & Plan Note (Signed)
GERD- no breakthrough symptoms on protonix 20mg . We will trial zantac- apparently he did ok on pepcid in the past

## 2018-05-04 NOTE — Assessment & Plan Note (Signed)
Diabetes well controlled. On metformin 500 mg daily in the past

## 2018-05-04 NOTE — Assessment & Plan Note (Signed)
Depression remains controlled on Paxil 40 mg.  PHQ 9 of 4.  No suicidal ideation.  Major depression recurrent now in full remission

## 2018-05-04 NOTE — Progress Notes (Signed)
Phone: [phone]  Subjective:  Patient presents today for their annual physical. Chief complaint-noted.   See problem oriented charting- ROS- full  review of systems was completed and negative including no chest pain or shortness of breath.  No headaches or blurry vision.  The following were reviewed and entered/updated in epic: Past Medical History:  Diagnosis Date  . Allergy   . Anxiety   . Arthritis   . Depression (disease)   . Diabetes mellitus ([place])   . Erosive esophagitis   . GERD (gastroesophageal reflux disease)   . Hemorrhoids   . History of cerebrovascular accident   . Hyperlipidemia   . Stroke ([place])    2001  . Tubular adenoma polyp of rectum 07/2010   Patient Active Problem List   Diagnosis Date Noted  . Subclavian artery stenosis, right ([place]) 05/04/2018    Priority: High  . Type II diabetes mellitus, well controlled ([place]) 12/11/2014    Priority: High  . History of CVA (cerebrovascular accident) 07/26/2007    Priority: High  . BPH associated with nocturia 04/24/2016    Priority: Medium  . Depression 08/21/2008    Priority: Medium  . Hyperlipidemia 07/26/2007    Priority: Medium  . Chondromalacia 11/05/2017    Priority: Low  . History of adenomatous polyp of colon 04/24/2016    Priority: Low  . GERD 07/26/2007    Priority: Low  . Visual disturbance 01/07/2018   Past Surgical History:  Procedure Laterality Date  . colon polyp removal  1967  . COLONOSCOPY      Family History  Problem Relation Age of Onset  . Parkinson's disease Mother   . Coronary artery disease Father   . Heart attack Father 50       infection related potentially  . Heart disease Maternal Grandfather        unknown age, MI at some age  . Heart attack Maternal Grandfather   . Diabetes Maternal Grandmother   . Colon cancer Neg Hx   . Esophageal cancer Neg Hx   . Stomach cancer Neg Hx   . Rectal cancer Neg Hx     Medications- reviewed and updated Current Outpatient  Medications  Medication Sig Dispense Refill  . aspirin 325 MG EC tablet Take 325 mg by mouth daily.    Marland Kitchen atorvastatin (LIPITOR) 40 MG tablet Take 1 tablet (40 mg total) by mouth daily. 30 tablet 11  . clopidogrel (PLAVIX) 75 MG tablet Take 1 tablet (75 mg total) by mouth daily. 30 tablet 11  . ferrous sulfate 325 (65 FE) MG EC tablet Take 325 mg by mouth daily with breakfast.    . fish oil-omega-3 fatty acids 1000 MG capsule Take 3 g by mouth daily.      . metFORMIN (GLUCOPHAGE) 500 MG tablet TAKE 1 TABLET BY MOUTH ONCE DAILY WITH BREAKFAST 90 tablet 1  . niacin 500 MG tablet Take 500 mg by mouth. 2 po at bedtime      . PARoxetine (PAXIL) 20 MG tablet Take 1 tablet (20 mg total) by mouth daily. 30 tablet 11  . ranitidine (ZANTAC) 150 MG capsule Take 1 capsule (150 mg total) by mouth 2 (two) times daily. 60 capsule 11  . tamsulosin (FLOMAX) 0.4 MG CAPS capsule Take 1 capsule (0.4 mg total) by mouth daily. 30 capsule 6   No current facility-administered medications for this visit.     Allergies-reviewed and updated No Known Allergies  Social History   Social History Narrative  Married (spouse at outside practice), 2 sons in college in 2016.       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 Oropharynx normal Neck: no thyromegaly CV: RRR no murmurs rubs or gallops Lungs: CTAB no crackles, wheeze, rhonchi Abdomen: soft/nontender/nondistended/normal bowel sounds. No rebound or guarding.  Ext: no edema Skin: warm, dry Neuro: grossly  normal, moves all extremities, PERRLA Rectal: normal tone, diffusely enlarged prostate, no masses or tenderness  Assessment/Plan:  62 y.o. male presenting for annual physical.  Health Maintenance counseling: 1. Anticipatory guidance: Patient counseled regarding regular dental exams -q6 months, eye exams -yearly, wearing seatbelts.  2. Risk factor reduction:  Advised patient of need for regular exercise and diet rich and fruits and vegetables to reduce risk of heart attack and stroke. Exercise- going daily but slightly less intense- does walk in AM and goes to gym at lunch, sometimes exercise in evening. Diet-down 4 lbs- feels like he is eating pretty reasonably.  Wt Readings from Last 3 Encounters:  05/04/18 192 lb 12.8 oz (87.5 kg)  03/26/18 196 lb (88.9 kg)  02/04/18 194 lb (88 kg)   3. Immunizations/screenings/ancillary studies offered updating Tdap today, he agrees. shingrix declines for now- will reconsider future date  Immunization History  Administered Date(s) Administered  . Influenza Whole 10/17/2009, 09/28/2012  . Influenza-Unspecified 10/12/2014, 10/09/2016, 09/21/2017  . Td 12/29/1997, 01/27/2008  . Tdap 05/04/2018  4. Prostate cancer screening- we will trend PSA.  Low risk rectal exam- does hae some BPH and remains on flomax Lab Results  Component Value Date   PSA 1.04 04/20/2017   PSA 1.01 04/17/2016   PSA 1.03 04/16/2015   5. Colon cancer screening -01/15/2016 with 5-year follow-up 6. Skin cancer screening-waist up exam today without any obvious cancerous or precancerous lesions -no dermatologist.  Advised regular sunscreen use. Denies worrisome, changing, or new skin lesions.    Status of chronic or acute concerns   Anemia in the past-could have been diet related.  He is up-to-date on colonoscopy as above.  Ferritin improved from under 10 to over 40 with dietary changes. Taking iron pill.   Tympanostomy tube placed 2017 in left ear- still in place  He also was  dealing with some right knee pain in November and was told chondral malacia/runner's knee.  He was given options of conservative care, injection, MRI.  He chose conservative care.  We supported him with conservative care at last visit.  Subclavian artery stenosis, right (HCC) No recurrence of dizziness or visual episodes since remaining well-hydrated.  He has discussed maximizing medical management with neurology.  We are going to try to push LDL under 70 if it is above that when checked today.  He asks about vascular surgery referral.  He asks about monitoring of the stenosis with imaging.  I told patient it would be reasonable to at least have one visit with vascular surgery to get their ideas on monitoring parameters and when to  refer back to them at a very minimum-also told patient they may want to follow him regularly.  Type II diabetes mellitus, well controlled ([place]) Diabetes well controlled. On metformin 500 mg daily in the past  Depression Depression remains controlled on Paxil 40 mg.  PHQ 9 of 4.  No suicidal ideation.  Major depression recurrent now in full remission  GERD GERD- no breakthrough symptoms on protonix 20mg . We will trial zantac- apparently he did ok on pepcid in the past  Hyperlipidemia Hyperlipidemia- LDL has been below 100 at least on atorvastatin 40 mg and fish oil and niacin.  Update lipids today-last LDL was at 82- hopefully under 70 - if not would change to rosuvastatin 40mg   History of CVA (cerebrovascular accident) History of CVA-remains on aspirin 81 mg and Plavix.  No residual effects.  We discussed could stop aspirin if ever had any bleeding issues.   Future Appointments  Date Time Provider [place]  10/04/2018 11:30 AM Cameron Sprang, MD [facility] None   No follow-ups on file.  Lab/Order associations: Preventative health care - Plan: Tdap vaccine greater than or equal to 7yo IM, Microalbumin / creatinine urine ratio, ranitidine (ZANTAC) 150 MG  capsule, Lipid panel, CBC, Comprehensive metabolic panel, Hemoglobin A1c, PSA  Screening for prostate cancer - Plan: PSA  Type II diabetes mellitus, well controlled ([place]) - Plan: Microalbumin / creatinine urine ratio, Lipid panel, CBC, Comprehensive metabolic panel, Hemoglobin A1c  Need for prophylactic vaccination with combined diphtheria-tetanus-pertussis (DTP) vaccine - Plan: Tdap vaccine greater than or equal to 7yo IM  Subclavian artery stenosis, right ([place]) - Plan: Ambulatory referral to Vascular Surgery  Meds ordered this encounter  Medications  . ranitidine (ZANTAC) 150 MG capsule    Sig: Take 1 capsule (150 mg total) by mouth 2 (two) times daily.    Dispense:  60 capsule    Refill:  11    Return precautions advised.  Garret Reddish, MD

## 2018-05-05 ENCOUNTER — Telehealth: Payer: Self-pay

## 2018-05-05 ENCOUNTER — Encounter: Payer: Self-pay | Admitting: Family Medicine

## 2018-05-05 NOTE — Telephone Encounter (Signed)
Called patient and left a message to call office back.  

## 2018-05-06 ENCOUNTER — Other Ambulatory Visit: Payer: Self-pay

## 2018-05-06 MED ORDER — ATORVASTATIN CALCIUM 80 MG PO TABS: 80.0000 mg | ORAL_TABLET | Freq: Every day | ORAL | 3 refills | Status: DC

## 2018-05-27 ENCOUNTER — Ambulatory Visit: Payer: Commercial Managed Care - PPO | Admitting: Cardiology

## 2018-05-27 ENCOUNTER — Encounter

## 2018-05-28 ENCOUNTER — Other Ambulatory Visit: Payer: Self-pay | Admitting: Family Medicine

## 2018-06-09 ENCOUNTER — Ambulatory Visit (INDEPENDENT_AMBULATORY_CARE_PROVIDER_SITE_OTHER): Payer: Commercial Managed Care - PPO | Admitting: Vascular Surgery

## 2018-06-09 ENCOUNTER — Other Ambulatory Visit: Payer: Self-pay

## 2018-06-09 ENCOUNTER — Encounter: Payer: Commercial Managed Care - PPO | Admitting: Vascular Surgery

## 2018-06-09 ENCOUNTER — Encounter: Payer: Self-pay | Admitting: Vascular Surgery

## 2018-06-09 VITALS — BP 140/86 | HR 64 | Resp 20 | Ht 71.0 in | Wt 193.0 lb

## 2018-06-09 DIAGNOSIS — I771 Stricture of artery: Secondary | ICD-10-CM | POA: Diagnosis not present

## 2018-06-09 NOTE — Progress Notes (Signed)
Patient name: Darrell Gaines MRN: [id number] DOB: 03-Jan-1956 Sex: male   REASON FOR CONSULT:    Right subclavian artery stenosis.  The consult is requested by Dr. Garret Reddish.  HPI:   Darrell Gaines is a pleasant 62 y.o. male, who had some visual disturbances bilaterally where he had some hazy vision briefly.  This was back in November and December 2018.  He thinks he might of been dehydrated at the time but given that he had a previous history of stroke 19 years previously with some slurred speech he had further work-up including a carotid duplex scan.  His carotid duplex scan did not show any evidence of carotid disease, however he had evidence of a right subclavian stenosis.   He was seen in consultation by neurology on 03/26/2018.  They noted that he had a previous left subcortical stroke in 2001.  He described the symptoms at that time as dizziness with some blurred vision.  The patient had an MRI in February of this year which did not show any acute changes.  He also had an echo which showed an ejection fraction of 55 to 60% with mild diastolic dysfunction.  Carotid Doppler showed no significant carotid disease.  It was felt that he could potentially be having some mild subclavian steal syndrome given his dizziness and blurred vision.  He was set up for a six-month follow-up visit.  He was to continue maximal medical therapy including antiplatelet therapy, aggressive lipid management, and lifestyle modifications.  He has had no further symptoms since December.  He denies any dizziness.  He denies any history of expressive or receptive aphasia or amaurosis fugax.  He said no focal weakness or paresthesias.  Of note, he is left-handed.  He is on aspirin, Plavix, and a statin.  Past Medical History:  Diagnosis Date  . Allergy   . Anxiety   . Arthritis   . Depression (disease)   . Diabetes mellitus ([place])   . Erosive esophagitis   . GERD (gastroesophageal reflux disease)   . Hemorrhoids    . History of cerebrovascular accident   . Hyperlipidemia   . Peripheral vascular disease ([place])   . Stroke ([place])    2001  . Subclavian artery stenosis, right ([place])   . Tubular adenoma polyp of rectum 07/2010    Family History  Problem Relation Age of Onset  . Parkinson's disease Mother   . Coronary artery disease Father   . Heart attack Father 60       infection related potentially  . Heart disease Father   . Heart disease Maternal Grandfather        unknown age, MI at some age  . Heart attack Maternal Grandfather   . Diabetes Maternal Grandmother   . Colon cancer Neg Hx   . Esophageal cancer Neg Hx   . Stomach cancer Neg Hx   . Rectal cancer Neg Hx     SOCIAL HISTORY: Social History   Socioeconomic History  . Marital status: Married    Spouse name: Not on file  . Number of children: 2  . Years of education: Not on file  . Highest education level: Not on file  Occupational History  . Occupation: pace Physicist, medical: [employer]  Social Needs  . Financial resource strain: Not on file  . Food insecurity:    Worry: Not on file    Inability: Not on file  . Transportation needs:    Medical:  Not on file    Non-medical: Not on file  Tobacco Use  . Smoking status: Never Smoker  . Smokeless tobacco: Never Used  Substance and Sexual Activity  . Alcohol use: Yes    Alcohol/week: 0.0 oz    Comment: Occasional  . Drug use: No  . Sexual activity: Not on file  Lifestyle  . Physical activity:    Days per week: Not on file    Minutes per session: Not on file  . Stress: Not on file  Relationships  . Social connections:    Talks on phone: Not on file    Gets together: Not on file    Attends religious service: Not on file    Active member of club or organization: Not on file    Attends meetings of clubs or organizations: Not on file    Relationship status: Not on file  . Intimate partner violence:    Fear of current or ex partner: Not on file     Emotionally abused: Not on file    Physically abused: Not on file    Forced sexual activity: Not on file  Other Topics Concern  . Not on file  Social History Narrative   Married (spouse at outside practice), 2 sons in college in 2016.       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 aspirin 325 MG EC tablet Take 325 mg by mouth daily.    Marland Kitchen atorvastatin (LIPITOR) 80 MG tablet Take 1 tablet (80 mg total) by mouth daily. 90 tablet 3  . clopidogrel (PLAVIX) 75 MG tablet TAKE 1 TABLET BY MOUTH ONCE DAILY 30 tablet 5  . ferrous sulfate 325 (65 FE) MG EC tablet Take 325 mg by mouth daily with breakfast.    . fish oil-omega-3 fatty acids 1000 MG capsule Take 3 g by mouth daily.      . metFORMIN (GLUCOPHAGE) 500 MG tablet TAKE 1 TABLET BY MOUTH ONCE DAILY WITH BREAKFAST 90 tablet 1  . niacin 500 MG tablet Take 500 mg by mouth. 2 po at bedtime      . PARoxetine (PAXIL) 20 MG tablet TAKE 1 TABLET BY MOUTH ONCE DAILY 30 tablet 5  . ranitidine (ZANTAC) 150 MG capsule Take 1 capsule (150 mg total) by mouth 2 (two) times daily. 60 capsule 11  . tamsulosin (FLOMAX) 0.4 MG CAPS capsule Take 1 capsule (0.4 mg total) by mouth daily. 30 capsule 6   No current facility-administered medications for this visit.     REVIEW OF SYSTEMS:  [X]  denotes positive finding, [ ]  denotes negative finding Cardiac  Comments:  Chest pain or chest pressure:    Shortness of breath upon exertion:    Short of breath when lying flat:    Irregular heart rhythm:        Vascular    Pain in calf, thigh, or hip brought on by ambulation:    Pain in feet at night that wakes you up from your sleep:     Blood clot in your veins:    Leg swelling:         Pulmonary    Oxygen at home:     Productive cough:     Wheezing:  Neurologic    Sudden weakness in arms or legs:     Sudden numbness in arms or legs:     Sudden onset of difficulty speaking or slurred speech:    Temporary loss of vision in one eye:     Problems with dizziness:         Gastrointestinal    Blood in stool:     Vomited blood:         Genitourinary    Burning when urinating:     Blood in urine:        Psychiatric    Major depression:         Hematologic    Bleeding problems:    Problems with blood clotting too easily:        Skin    Rashes or ulcers:        Constitutional    Fever or chills:     PHYSICAL EXAM:   Vitals:   06/09/18 1315 06/09/18 1317  BP: (!) 142/91 140/86  Pulse: 64   Resp: 20   SpO2: 99%   Weight: 193 lb (87.5 kg)   Height: 5\' 11"  (1.803 m)    In our office today there were no differences in the arm pressures between the right and left arm. GENERAL: The patient is a well-nourished male, in no acute distress. The vital signs are documented above. CARDIAC: There is a regular rate and rhythm.  VASCULAR: I do not detect carotid bruits. He has palpable radial and brachial pulses bilaterally. He has palpable femoral, popliteal, and pedal pulses bilaterally. He has no significant lower extremity swelling. PULMONARY: There is good air exchange bilaterally without wheezing or rales. ABDOMEN: Soft and non-tender with normal pitched bowel sounds.  I do not palpate an abdominal aortic aneurysm. MUSCULOSKELETAL: There are no major deformities or cyanosis. NEUROLOGIC: No focal weakness or paresthesias are detected. SKIN: There are no ulcers or rashes noted. PSYCHIATRIC: The patient has a normal affect.  DATA:    CAROTID DUPLEX: I have reviewed the carotid duplex scan that was done on 01/14/2018.  This shows a less than 39% carotid stenosis bilaterally.  His carotid duplex scan did show some elevated velocities in the right subclavian artery.  Peak systolic velocity  was 419 cm/s.  MEDICAL ISSUES:   RIGHT SUBCLAVIAN ARTERY STENOSIS: By duplex this patient does have a mild right subclavian artery stenosis.  However arm pressures are equal and he has palpable pulses in the right arm.  Given that he has had no further symptoms I would not recommend an aggressive work-up for this unless he develop recurrent problems with dizziness.  If he does develop recurrent problems then certainly we could consider arch aortogram and right subclavian arteriogram to see if he is a candidate for stenting of the subclavian stenosis.  However, currently the stenosis appears to be mild and we have discussed the potential risks of angioplasty and stenting of the subclavian artery.  I have recommended a follow-up duplex scan in 6 months and I will see him back at that time.  He knows to call sooner if he has problems.  In the meantime he will continue taking his aspirin, Plavix, and statin.  Fortunately, he is not a smoker.   Deitra Mayo [facility] [phone]

## 2018-06-11 ENCOUNTER — Other Ambulatory Visit: Payer: Self-pay

## 2018-06-11 DIAGNOSIS — I771 Stricture of artery: Secondary | ICD-10-CM

## 2018-07-27 ENCOUNTER — Other Ambulatory Visit: Payer: Self-pay | Admitting: Family Medicine

## 2018-10-04 ENCOUNTER — Other Ambulatory Visit: Payer: Self-pay

## 2018-10-04 ENCOUNTER — Ambulatory Visit (INDEPENDENT_AMBULATORY_CARE_PROVIDER_SITE_OTHER): Payer: Commercial Managed Care - PPO | Admitting: Neurology

## 2018-10-04 ENCOUNTER — Encounter: Payer: Self-pay | Admitting: Neurology

## 2018-10-04 VITALS — BP 134/70 | HR 60 | Ht 71.0 in | Wt 195.0 lb

## 2018-10-04 DIAGNOSIS — I771 Stricture of artery: Secondary | ICD-10-CM | POA: Diagnosis not present

## 2018-10-04 NOTE — Patient Instructions (Signed)
Great seeing you! Continue with control of blood pressure, cholesterol, and sugar levels, continue aspirin and Plavix. Follow-up as needed, call for any changes.

## 2018-10-04 NOTE — Progress Notes (Signed)
NEUROLOGY FOLLOW UP OFFICE NOTE  Darrell Gaines [id number] 1956-11-06  HISTORY OF PRESENT ILLNESS: I had the pleasure of seeing Darrell Gaines in follow-up in the neurology clinic on 10/04/2018.  The patient was last seen 6 months ago for recurrent episodes of vision changes and dizziness. MRI brain in 01/2018 did not show any acute changes, there were chronic left subcortical and left cerebellar strokes seen. Carotid dopplers did not show significant stenosis, however it was noted that the right subclavian artery was stenotic. He underwent an MRA head and neck in 03/2018 which I personally reviewed, there was atherosclerosis and stenosis affecting the right subclavian and nondominant right vertebral arteries, 70% stenosis of the proximal right subclavian artery, tandem moderate to severe distal right vertebral artery V4 segment stenoses both proximal and distal to the right PICA, moderate to severe atherosclerotic stenosis of the left PCA P3 segments but preserved distal flow. We discussed maximum medical management, he is on dual antiplatelet medications, statin. He was evaluated by Vascular Surgery, records reviewed. He denies any further dizziness since December 2018. If symptoms recur, consideration for further testing will be done with Vascular. He denies any diplopia, dysarthria/dysphagia, focal numbness/tingling/weakness. He denies any headaches, no falls.   Laboratory Data: Lab Results  Component Value Date   CHOL 143 05/04/2018   HDL 35.80 (L) 05/04/2018   LDLCALC 73 05/04/2018   LDLDIRECT 82.0 01/07/2018   TRIG 168.0 (H) 05/04/2018   CHOLHDL 4 05/04/2018   History on Initial Assessment 03/26/2018: This is a pleasant 62 year old left-handed man with a history of diabetes, hyperlipidemia, left subcortical stroke in 2001, right subclavian stenosis, presenting for evaluation of recurrent episodes of vision changes and dizziness over the past 4-5 months. He has had 4 episodes, last occurrence was  in December 2018. He recalls the first 3 episodes occurred while he was sitting, for the first couple of times he had left ear trouble and attributed symptoms to this. He would have dizziness with a spinning quality, vision would be blurred because of the sensation of motion, and he would have a "short little blip of vision." He is able to talk, no dysarthria, confusion, drowsiness, headaches, nausea/vomiting, focal numbness/tingling. They have only lasted 30 seconds or so. The last episode occurred after he had worked out a few hours prior. With one episode before Christmas, he was walking and felt like his right leg was a little weak but it did not last long. He has a history of stroke in 2001 where he woke up with right-sided weakness and right facial droop, he was normal upon arrival to the ER. MRI brain showed a cluster of strokes in the left subcortical region (posterior limb of internal capsule, upper basal ganglia and corona radiata). Due to recent symptoms, he had an MRI brain with and without contrast in 01/2018 which did not show any acute changes. There chronic left subcortical strokes and posterior inferior left cerebellum were seen. He had an echocardiogram which showed an EF of 57-01%, mild diastolic dysfunction. He had carotid dopplers which did not show significant stenosis, however the right subclavian artery was stenotic. He denies any symptoms on his right arm, he does not notice any arm pain, temperature changes, or sensory/motor symptoms on his right arm. His last LDL in April 2018 was 65. He is taking a statin, aspirin, and Plavix. His paternal aunts had strokes.   PAST MEDICAL HISTORY: Past Medical History:  Diagnosis Date  . Allergy   . Anxiety   .  Arthritis   . Depression (disease)   . Diabetes mellitus ([place])   . Erosive esophagitis   . GERD (gastroesophageal reflux disease)   . Hemorrhoids   . History of cerebrovascular accident   . Hyperlipidemia   . Peripheral vascular  disease ([place])   . Stroke ([place])    2001  . Subclavian artery stenosis, right ([place])   . Tubular adenoma polyp of rectum 07/2010    MEDICATIONS: Current Outpatient Medications on File Prior to Visit  Medication Sig Dispense Refill  . aspirin 325 MG EC tablet Take 325 mg by mouth daily.    Marland Kitchen atorvastatin (LIPITOR) 80 MG tablet Take 1 tablet (80 mg total) by mouth daily. 90 tablet 3  . clopidogrel (PLAVIX) 75 MG tablet TAKE 1 TABLET BY MOUTH ONCE DAILY 30 tablet 5  . ferrous sulfate 325 (65 FE) MG EC tablet Take 325 mg by mouth daily with breakfast.    . fish oil-omega-3 fatty acids 1000 MG capsule Take 3 g by mouth daily.      . metFORMIN (GLUCOPHAGE) 500 MG tablet TAKE 1 TABLET BY MOUTH ONCE DAILY WITH BREAKFAST 90 tablet 1  . niacin 500 MG tablet Take 500 mg by mouth. 2 po at bedtime      . PARoxetine (PAXIL) 20 MG tablet TAKE 1 TABLET BY MOUTH ONCE DAILY 30 tablet 5  . ranitidine (ZANTAC) 150 MG capsule Take 1 capsule (150 mg total) by mouth 2 (two) times daily. 60 capsule 11  . tamsulosin (FLOMAX) 0.4 MG CAPS capsule TAKE 1 CAPSULE BY MOUTH ONCE DAILY 30 capsule 5   No current facility-administered medications on file prior to visit.     ALLERGIES: No Known Allergies  FAMILY HISTORY: Family History  Problem Relation Age of Onset  . Parkinson's disease Mother   . Coronary artery disease Father   . Heart attack Father 30       infection related potentially  . Heart disease Father   . Heart disease Maternal Grandfather        unknown age, MI at some age  . Heart attack Maternal Grandfather   . Diabetes Maternal Grandmother   . Colon cancer Neg Hx   . Esophageal cancer Neg Hx   . Stomach cancer Neg Hx   . Rectal cancer Neg Hx     SOCIAL HISTORY: Social History   Socioeconomic History  . Marital status: Married    Spouse name: Not on file  . Number of children: 2  . Years of education: Not on file  . Highest education level: Not on file  Occupational History  .  Occupation: pace Physicist, medical: [employer]  Social Needs  . Financial resource strain: Not on file  . Food insecurity:    Worry: Not on file    Inability: Not on file  . Transportation needs:    Medical: Not on file    Non-medical: Not on file  Tobacco Use  . Smoking status: Never Smoker  . Smokeless tobacco: Never Used  Substance and Sexual Activity  . Alcohol use: Yes    Alcohol/week: 0.0 standard drinks    Comment: Occasional  . Drug use: No  . Sexual activity: Not on file  Lifestyle  . Physical activity:    Days per week: Not on file    Minutes per session: Not on file  . Stress: Not on file  Relationships  . Social connections:    Talks on phone: Not on file  Gets together: Not on file    Attends religious service: Not on file    Active member of club or organization: Not on file    Attends meetings of clubs or organizations: Not on file    Relationship status: Not on file  . Intimate partner violence:    Fear of current or ex partner: Not on file    Emotionally abused: Not on file    Physically abused: Not on file    Forced sexual activity: Not on file  Other Topics Concern  . Not on file  Social History Narrative   Married (spouse at outside practice), 2 sons in college in 2016.       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, in [place])      Hobbies: running, music, reading      Cell [phone]   [employer] release on 05-20-2010    REVIEW OF SYSTEMS: Constitutional: No fevers, chills, or sweats, no generalized fatigue, change in appetite Eyes: No visual changes, double vision, eye pain Ear, nose and throat: No hearing loss, ear pain, nasal congestion, sore throat Cardiovascular: No chest pain, palpitations Respiratory:  No shortness of breath at rest or with exertion, wheezes GastrointestinaI: No nausea, vomiting, diarrhea, abdominal pain, fecal incontinence Genitourinary:  No  dysuria, urinary retention or frequency Musculoskeletal:  No neck pain, back pain Integumentary: No rash, pruritus, skin lesions Neurological: as above Psychiatric: No depression, insomnia, anxiety Endocrine: No palpitations, fatigue, diaphoresis, mood swings, change in appetite, change in weight, increased thirst Hematologic/Lymphatic:  No anemia, purpura, petechiae. Allergic/Immunologic: no itchy/runny eyes, nasal congestion, recent allergic reactions, rashes  PHYSICAL EXAM: Vitals:   10/04/18 1126  BP: 134/70  Pulse: 60  SpO2: 98%   General: No acute distress Head:  Normocephalic/atraumatic Neck: supple, no paraspinal tenderness, full range of motion Heart:  Regular rate and rhythm Lungs:  Clear to auscultation bilaterally Back: No paraspinal tenderness Skin/Extremities: No rash, no edema Neurological Exam: alert and oriented to person, place, and time. No aphasia or dysarthria. Fund of knowledge is appropriate.  Recent and remote memory are intact.  Attention and concentration are normal.    Able to name objects and repeat phrases. Cranial nerves: Pupils equal, round, reactive to light. Extraocular movements intact with no nystagmus. Visual fields full. Facial sensation intact. No facial asymmetry. Tongue, uvula, palate midline.  Motor: Bulk and tone normal, muscle strength 5/5 throughout with no pronator drift.  Sensation to light touch intact.  No extinction to double simultaneous stimulation.  Deep tendon reflexes +2 throughout, toes downgoing.  Finger to nose testing intact.  Gait narrow-based and steady, able to tandem walk adequately.  Romberg negative.  IMPRESSION: This is a pleasant 62 yo LH man with a history of diabetes, hyperlipidemia, left subcortical stroke in 2001, right subclavian stenosis, who presented with recurrent transient episodes of vertigo and blurred vision. He has not had any further episodes since December 2018. MRI brain did not show any acute changes, there  were old left subcortical and left cerebellar strokes seen. Echo was normal, carotid dopplers did not show significant carotid stenosis, however note of right subclavian stenosis. We had discussed how subclavian steal syndrome can present with vertigo and blurred vision. His MRA head and neck showed 70% stenosis of the proximal right subclavian artery, tandem moderate to severe distal right vertebral artery V4 segment stenoses both proximal and distal to the right PICA, moderate to severe atherosclerotic stenosis of the left PCA P3  segments but preserved distal flow. He has been asymptomatic for almost a year, neurological exam today normal. We again discussed maximal medical management, continue aspirin, Plavix, control of vascular risk factors, goal LDL <100. He has a follow-up with Vascular in December 2019. He knows to go to the ER immediately for any sudden change in symptoms. He will follow-up on an as needed basis and knows to call for any changes.   Thank you for allowing me to participate in his care.  Please do not hesitate to call for any questions or concerns.  The duration of this appointment visit was 20 minutes of face-to-face time with the patient.  Greater than 50% of this time was spent in counseling, explanation of diagnosis, planning of further management, and coordination of care.   Ellouise Newer, M.D.   CC: Dr. Yong Channel, Dr. Scot Dock

## 2018-10-30 ENCOUNTER — Other Ambulatory Visit: Payer: Self-pay | Admitting: Family Medicine

## 2018-11-01 ENCOUNTER — Telehealth: Payer: Self-pay | Admitting: Family Medicine

## 2018-11-01 NOTE — Telephone Encounter (Signed)
Copied from [place] [id number]. Topic: General - Inquiry >> Nov 01, 2018  9:10 AM Cecelia Byars, NT wrote: Reason for ETI:[id number] pharmacy called and said there is a recall on  ranitidine (ZANTAC) 150 MG capsule , and would like to know if something else can be called in for the patient, please advise

## 2018-11-03 NOTE — Telephone Encounter (Signed)
Forwarding to Dr. Hunter to advise.  

## 2018-11-03 NOTE — Addendum Note (Signed)
Addended by: Jasper Loser on: 11/03/2018 05:03 PM   Modules accepted: Orders

## 2018-11-03 NOTE — Telephone Encounter (Signed)
Called pt and left VM to call the office. Rx for Pepcid pending.

## 2018-11-03 NOTE — Telephone Encounter (Signed)
Please call in Pepcid 20 mg twice a day #60 with 5 refills to see how patient does with that

## 2018-11-04 MED ORDER — FAMOTIDINE 20 MG PO TABS: 20.0000 mg | ORAL_TABLET | Freq: Two times a day (BID) | ORAL | 5 refills | Status: DC

## 2018-11-04 NOTE — Addendum Note (Signed)
Addended by: Jasper Loser on: 11/04/2018 01:57 PM   Modules accepted: Orders

## 2018-11-04 NOTE — Telephone Encounter (Signed)
Rx for Pepcid has been sent to [facility] (pharmacy on file).

## 2018-11-15 ENCOUNTER — Encounter: Payer: Self-pay | Admitting: Family Medicine

## 2018-11-15 ENCOUNTER — Ambulatory Visit (INDEPENDENT_AMBULATORY_CARE_PROVIDER_SITE_OTHER): Payer: Commercial Managed Care - PPO | Admitting: Family Medicine

## 2018-11-15 VITALS — BP 112/80 | HR 52 | Temp 98.2°F | Ht 71.0 in | Wt 197.8 lb

## 2018-11-15 DIAGNOSIS — E119 Type 2 diabetes mellitus without complications: Secondary | ICD-10-CM

## 2018-11-15 DIAGNOSIS — E785 Hyperlipidemia, unspecified: Secondary | ICD-10-CM | POA: Diagnosis not present

## 2018-11-15 DIAGNOSIS — I771 Stricture of artery: Secondary | ICD-10-CM | POA: Diagnosis not present

## 2018-11-15 DIAGNOSIS — F3342 Major depressive disorder, recurrent, in full remission: Secondary | ICD-10-CM | POA: Diagnosis not present

## 2018-11-15 DIAGNOSIS — Z23 Encounter for immunization: Secondary | ICD-10-CM

## 2018-11-15 DIAGNOSIS — Z6827 Body mass index (BMI) 27.0-27.9, adult: Secondary | ICD-10-CM

## 2018-11-15 LAB — POCT GLYCOSYLATED HEMOGLOBIN (HGB A1C): HEMOGLOBIN A1C: 5.8 % — AB (ref 4.0–5.6)

## 2018-11-15 LAB — COMPREHENSIVE METABOLIC PANEL
ALK PHOS: 47 U/L (ref 39–117)
ALT: 51 U/L (ref 0–53)
AST: 28 U/L (ref 0–37)
Albumin: 4.3 g/dL (ref 3.5–5.2)
BILIRUBIN TOTAL: 0.5 mg/dL (ref 0.2–1.2)
BUN: 18 mg/dL (ref 6–23)
CALCIUM: 9.2 mg/dL (ref 8.4–10.5)
CO2: 26 mEq/L (ref 19–32)
Chloride: 104 mEq/L (ref 96–112)
Creatinine, Ser: 1.01 mg/dL (ref 0.40–1.50)
GFR: 79.81 mL/min (ref >60.00)
Glucose, Bld: 115 mg/dL — ABNORMAL HIGH (ref 70–99)
POTASSIUM: 4.2 meq/L (ref 3.5–5.1)
Sodium: 138 mEq/L (ref 135–145)
Total Protein: 6.5 g/dL (ref 6.0–8.3)

## 2018-11-15 LAB — LDL CHOLESTEROL, DIRECT: Direct LDL: 83 mg/dL

## 2018-11-15 NOTE — Assessment & Plan Note (Signed)
S: Patient remains on Paxil 20 mg.  He continues to do well with PHQ 9 under 5 A/P: Stable-continue current medication

## 2018-11-15 NOTE — Assessment & Plan Note (Addendum)
S:  controlled on metformin 500 mg once daily with breakfast Lab Results  Component Value Date   HGBA1C 6.3 05/04/2018   HGBA1C 6.1 01/08/2018   HGBA1C 6.5 04/20/2017   A/P: Updated point-of-care A1c today shows good control--> 5.8

## 2018-11-15 NOTE — Assessment & Plan Note (Signed)
S: Hopefully controlled on atorvastatin 80 mg-increased last visit due to LDL over 70 Lab Results  Component Value Date   CHOL 143 05/04/2018   HDL 35.80 (L) 05/04/2018   LDLCALC 73 05/04/2018   LDLDIRECT 82.0 01/07/2018   TRIG 168.0 (H) 05/04/2018   CHOLHDL 4 05/04/2018   A/P: Hopefully improved with LDL under 70- update direct LDL today

## 2018-11-15 NOTE — Assessment & Plan Note (Signed)
S: From neurology visit earlier this year "We had discussed how subclavian steal syndrome can present with vertigo and blurred vision. His MRA head and neck showed 70% stenosis of the proximal right subclavian artery, tandem moderate to severe distal right vertebral artery V4 segment stenoses both proximal and distal to the right PICA, moderate to severe atherosclerotic stenosis of the left PCA P3 segments but preserved distal flow. He has been asymptomatic for almost a year, neurological exam today normal."  Patient states he has not had any more vertigo or blurred vision. A/P: Stable and has vascular follow-up next month.  We are doing all we can to manage risk factors for progression.

## 2018-11-15 NOTE — Progress Notes (Addendum)
Subjective:  Darrell Gaines is a 62 y.o. year old very pleasant male patient who presents for/with See problem oriented charting ROS-  No chest pain or shortness of breath. No headache or blurry vision. No more vertigo/dizziness  Past Medical History-  Patient Active Problem List   Diagnosis Date Noted  . Subclavian artery stenosis, right ([place]) 05/04/2018    Priority: High  . Type II diabetes mellitus, well controlled ([place]) 12/11/2014    Priority: High  . History of CVA (cerebrovascular accident) 07/26/2007    Priority: High  . BPH associated with nocturia 04/24/2016    Priority: Medium  . Depression 08/21/2008    Priority: Medium  . Hyperlipidemia 07/26/2007    Priority: Medium  . Chondromalacia 11/05/2017    Priority: Low  . History of adenomatous polyp of colon 04/24/2016    Priority: Low  . GERD 07/26/2007    Priority: Low  . Visual disturbance 01/07/2018    Medications- reviewed and updated Current Outpatient Medications  Medication Sig Dispense Refill  . aspirin 325 MG EC tablet Take 325 mg by mouth daily.    Marland Kitchen atorvastatin (LIPITOR) 80 MG tablet Take 1 tablet (80 mg total) by mouth daily. 90 tablet 3  . clopidogrel (PLAVIX) 75 MG tablet TAKE 1 TABLET BY MOUTH ONCE DAILY 30 tablet 5  . famotidine (PEPCID) 20 MG tablet Take 1 tablet (20 mg total) by mouth 2 (two) times daily. Take with NSAID 60 tablet 5  . ferrous sulfate 325 (65 FE) MG EC tablet Take 325 mg by mouth daily with breakfast.    . fish oil-omega-3 fatty acids 1000 MG capsule Take 3 g by mouth daily.      . metFORMIN (GLUCOPHAGE) 500 MG tablet TAKE 1 TABLET BY MOUTH ONCE DAILY WITH BREAKFAST 90 tablet 2  . niacin 500 MG tablet Take 500 mg by mouth. 2 po at bedtime      . PARoxetine (PAXIL) 20 MG tablet TAKE 1 TABLET BY MOUTH ONCE DAILY 30 tablet 5  . tamsulosin (FLOMAX) 0.4 MG CAPS capsule TAKE 1 CAPSULE BY MOUTH ONCE DAILY 30 capsule 5   No current facility-administered medications for this visit.      Objective: BP 112/80 (BP Location: Left Arm, Patient Position: Sitting, Cuff Size: Large)   Pulse (!) 52   Temp 98.2 F (36.8 C) (Oral)   Ht 5\' 11"  (1.803 m)   Wt 197 lb 12.8 oz (89.7 kg)   SpO2 97%   BMI 27.59 kg/m  Gen: NAD, resting comfortably CV: RRR no murmurs rubs or gallops Lungs: CTAB no crackles, wheeze, rhonchi Abdomen: soft/nontender/nondistended/normal bowel sounds.  Ext: no edema Skin: warm, dry Neuro: grossly normal, moves all extremities  Assessment/Plan:  Type II diabetes mellitus, well controlled ([place]) S:  controlled on metformin 500 mg once daily with breakfast Lab Results  Component Value Date   HGBA1C 6.3 05/04/2018   HGBA1C 6.1 01/08/2018   HGBA1C 6.5 04/20/2017   A/P: Updated point-of-care A1c today shows good control--> 5.8   Hyperlipidemia S: Hopefully controlled on atorvastatin 80 mg-increased last visit due to LDL over 70 Lab Results  Component Value Date   CHOL 143 05/04/2018   HDL 35.80 (L) 05/04/2018   LDLCALC 73 05/04/2018   LDLDIRECT 82.0 01/07/2018   TRIG 168.0 (H) 05/04/2018   CHOLHDL 4 05/04/2018   A/P: Hopefully improved with LDL under 70- update direct LDL today  Depression S: Patient remains on Paxil 20 mg.  He continues to do well  with PHQ 9 under 5 A/P: Stable-continue current medication  Subclavian artery stenosis, right (HCC) S: From neurology visit earlier this year "We had discussed how subclavian steal syndrome can present with vertigo and blurred vision. His MRA head and neck showed 70% stenosis of the proximal right subclavian artery, tandem moderate to severe distal right vertebral artery V4 segment stenoses both proximal and distal to the right PICA, moderate to severe atherosclerotic stenosis of the left PCA P3 segments but preserved distal flow. He has been asymptomatic for almost a year, neurological exam today normal."  Patient states he has not had any more vertigo or blurred vision. A/P: Stable and has  vascular follow-up next month.  We are doing all we can to manage risk factors for progression.  Slightly elevated BMI-weight trending up slightly-we discussed reversing this trend.  Discussed continuing his exercise-doing 6 times a week.  Can clean up diet some.  Overall I think he is doing pretty well for his age.  Future Appointments  Date Time Provider [place]  12/15/2018 10:00 AM [facility] - [facility]  12/15/2018 11:00 AM Angelia Mould, MD [facility]   Return in about 6 months (around 05/16/2019) for physical.  Lab/Order associations: Hyperlipidemia, unspecified hyperlipidemia type - Plan: Comprehensive metabolic panel, LDL cholesterol, direct  Type II diabetes mellitus, well controlled (HCC) - Plan: Pneumococcal polysaccharide vaccine 23-valent greater than or equal to 2yo subcutaneous/IM, POCT glycosylated hemoglobin (Hb A1C)  Recurrent major depressive disorder, in full remission ([place])  Subclavian artery stenosis, right (HCC)  Need for prophylactic vaccination against Streptococcus pneumoniae (pneumococcus) - Plan: Pneumococcal polysaccharide vaccine 23-valent greater than or equal to 2yo subcutaneous/IM  Return precautions advised.  Garret Reddish, MD

## 2018-11-15 NOTE — Patient Instructions (Addendum)
Health Maintenance Due  Topic Date Due  . PNEUMOCOCCAL POLYSACCHARIDE VACCINE AGE 61-64 HIGH RISK-offered this today due to diabetes- was given 11/05/1959  . FOOT EXAM-today 04/27/2018  . HEMOGLOBIN A1C-today and looked great! Lab Results  Component Value Date   HGBA1C 5.8 (A) 11/15/2018   11/04/2018   Please stop by lab before you go To check on cholesterol  Keep u pthe great work!

## 2018-11-15 NOTE — Addendum Note (Signed)
Addended by: Marin Olp on: 11/15/2018 08:37 AM   Modules accepted: Miquel Dunn

## 2018-11-16 ENCOUNTER — Other Ambulatory Visit: Payer: Self-pay

## 2018-11-16 MED ORDER — ROSUVASTATIN CALCIUM 40 MG PO TABS: 40.0000 mg | ORAL_TABLET | Freq: Every day | ORAL | 3 refills | Status: DC

## 2018-11-29 ENCOUNTER — Other Ambulatory Visit: Payer: Self-pay | Admitting: Family Medicine

## 2018-12-15 ENCOUNTER — Encounter: Payer: Self-pay | Admitting: Vascular Surgery

## 2018-12-15 ENCOUNTER — Ambulatory Visit (HOSPITAL_COMMUNITY)
Admission: RE | Admit: 2018-12-15 | Discharge: 2018-12-15 | Disposition: A | Discharge status: Home / Self Care | Payer: Commercial Managed Care - PPO | Source: Ambulatory Visit | Attending: Vascular Surgery | Admitting: Vascular Surgery

## 2018-12-15 ENCOUNTER — Ambulatory Visit (INDEPENDENT_AMBULATORY_CARE_PROVIDER_SITE_OTHER): Payer: Commercial Managed Care - PPO | Admitting: Vascular Surgery

## 2018-12-15 ENCOUNTER — Other Ambulatory Visit: Payer: Self-pay

## 2018-12-15 VITALS — BP 141/82 | HR 58 | Temp 98.2°F | Resp 20 | Ht 71.0 in | Wt 197.0 lb

## 2018-12-15 DIAGNOSIS — I771 Stricture of artery: Secondary | ICD-10-CM

## 2018-12-15 NOTE — Progress Notes (Signed)
Patient name: Darrell Gaines MRN: [id number] DOB: 10/07/1956 Sex: male  REASON FOR VISIT:   Follow-up of right subclavian stenosis.  HPI:   Darrell Gaines is a pleasant 62 y.o. male who I saw in June of this year with a right subclavian stenosis which that was essentially asymptomatic.  He was fairly concerned about this as he had a previous stroke when he was much younger.  Therefore we set up a six-month follow-up visit.  Since I saw him last, he denies any history of stroke, TIAs, expressive or receptive aphasia, or amaurosis fugax.  He is on aspirin and is on Plavix.  He denies any pain or paresthesias in either upper extremity.  He denies any problems with dizziness.  Past Medical History:  Diagnosis Date  . Allergy   . Anxiety   . Arthritis   . Depression (disease)   . Diabetes mellitus ([place])   . Erosive esophagitis   . GERD (gastroesophageal reflux disease)   . Hemorrhoids   . History of cerebrovascular accident   . Hyperlipidemia   . Peripheral vascular disease ([place])   . Stroke ([place])    2001  . Subclavian artery stenosis, right ([place])   . Tubular adenoma polyp of rectum 07/2010    Family History  Problem Relation Age of Onset  . Parkinson's disease Mother   . Coronary artery disease Father   . Heart attack Father 54       infection related potentially  . Heart disease Father   . Heart disease Maternal Grandfather        unknown age, MI at some age  . Heart attack Maternal Grandfather   . Diabetes Maternal Grandmother   . Colon cancer Neg Hx   . Esophageal cancer Neg Hx   . Stomach cancer Neg Hx   . Rectal cancer Neg Hx     SOCIAL HISTORY: Social History   Tobacco Use  . Smoking status: Never Smoker  . Smokeless tobacco: Never Used  Substance Use Topics  . Alcohol use: Yes    Alcohol/week: 0.0 standard drinks    Comment: Occasional    No Known Allergies  Current Outpatient Medications  Medication Sig Dispense Refill  . aspirin 325 MG EC tablet Take  325 mg by mouth daily.    . clopidogrel (PLAVIX) 75 MG tablet TAKE 1 TABLET BY MOUTH ONCE DAILY 30 tablet 5  . famotidine (PEPCID) 20 MG tablet Take 1 tablet (20 mg total) by mouth 2 (two) times daily. Take with NSAID 60 tablet 5  . ferrous sulfate 325 (65 FE) MG EC tablet Take 325 mg by mouth daily with breakfast.    . fish oil-omega-3 fatty acids 1000 MG capsule Take 3 g by mouth daily.      . metFORMIN (GLUCOPHAGE) 500 MG tablet TAKE 1 TABLET BY MOUTH ONCE DAILY WITH BREAKFAST 90 tablet 2  . niacin 500 MG tablet Take 500 mg by mouth. 2 po at bedtime      . PARoxetine (PAXIL) 20 MG tablet TAKE 1 TABLET BY MOUTH ONCE DAILY 30 tablet 5  . rosuvastatin (CRESTOR) 40 MG tablet Take 1 tablet (40 mg total) by mouth daily. 90 tablet 3  . tamsulosin (FLOMAX) 0.4 MG CAPS capsule TAKE 1 CAPSULE BY MOUTH ONCE DAILY 30 capsule 5   No current facility-administered medications for this visit.     REVIEW OF SYSTEMS:  [X]  denotes positive finding, [ ]  denotes negative finding Cardiac  Comments:  Chest  pain or chest pressure:    Shortness of breath upon exertion:    Short of breath when lying flat:    Irregular heart rhythm:        Vascular    Pain in calf, thigh, or hip brought on by ambulation:    Pain in feet at night that wakes you up from your sleep:     Blood clot in your veins:    Leg swelling:         Pulmonary    Oxygen at home:    Productive cough:     Wheezing:         Neurologic    Sudden weakness in arms or legs:     Sudden numbness in arms or legs:     Sudden onset of difficulty speaking or slurred speech:    Temporary loss of vision in one eye:     Problems with dizziness:         Gastrointestinal    Blood in stool:     Vomited blood:         Genitourinary    Burning when urinating:     Blood in urine:        Psychiatric    Major depression:         Hematologic    Bleeding problems:    Problems with blood clotting too easily:        Skin    Rashes or ulcers:          Constitutional    Fever or chills:     PHYSICAL EXAM:   Vitals:   12/15/18 1044 12/15/18 1047  BP: 136/82 (!) 141/82  Pulse: (!) 58   Resp: 20   Temp: 98.2 F (36.8 C)   SpO2: 100%   Weight: 197 lb (89.4 kg)   Height: 5\' 11"  (1.803 m)     GENERAL: The patient is a well-nourished male, in no acute distress. The vital signs are documented above. CARDIAC: There is a regular rate and rhythm.  VASCULAR: I do not detect carotid bruits or supraclavicular bruits. Both feet are warm and well-perfused. PULMONARY: There is good air exchange bilaterally without wheezing or rales. ABDOMEN: Soft and non-tender with normal pitched bowel sounds.  MUSCULOSKELETAL: There are no major deformities or cyanosis. NEUROLOGIC: No focal weakness or paresthesias are detected. SKIN: There are no ulcers or rashes noted. PSYCHIATRIC: The patient has a normal affect.  DATA:    CAROTID DUPLEX: I have independently interpreted his carotid duplex scan today.  There is no significant carotid disease bilaterally.  Both vertebral arteries are patent with antegrade flow.  There is a right subclavian artery stenosis with a peak systolic velocity of 456 cm/s.  There is no significant left subclavian stenosis.  MEDICAL ISSUES:   RIGHT SUBCLAVIAN ARTERY STENOSIS: This patient has a moderate right carotid stenosis by duplex.  Interestingly his blood pressures are equal in both arms.  He is asymptomatic.  For these reasons I do not think he needs routine follow-up studies.  I have instructed him to be sure that he always has his blood pressure taken in the left arm as this will be more accurate.  I will be happy to see him back at any time if any symptoms arise such as weakness or paresthesias in the right arm or problems with dizziness.  Deitra Mayo [facility] [phone]

## 2019-01-31 ENCOUNTER — Other Ambulatory Visit: Payer: Self-pay | Admitting: Family Medicine

## 2019-04-21 ENCOUNTER — Telehealth: Payer: Commercial Managed Care - PPO | Admitting: Nurse Practitioner

## 2019-04-21 DIAGNOSIS — H1031 Unspecified acute conjunctivitis, right eye: Secondary | ICD-10-CM | POA: Diagnosis not present

## 2019-04-21 MED ORDER — POLYMYXIN B-TRIMETHOPRIM 10000-0.1 UNIT/ML-% OP SOLN: 2.0000 [drp] | OPHTHALMIC | 0 refills | Status: AC

## 2019-04-21 NOTE — Progress Notes (Signed)

## 2019-05-02 ENCOUNTER — Other Ambulatory Visit: Payer: Self-pay | Admitting: Family Medicine

## 2019-05-17 ENCOUNTER — Encounter: Payer: Self-pay | Admitting: Family Medicine

## 2019-05-17 ENCOUNTER — Ambulatory Visit (INDEPENDENT_AMBULATORY_CARE_PROVIDER_SITE_OTHER): Payer: Commercial Managed Care - PPO | Admitting: Family Medicine

## 2019-05-17 ENCOUNTER — Other Ambulatory Visit: Payer: Self-pay

## 2019-05-17 VITALS — BP 136/79 | HR 49 | Temp 97.5°F | Ht 71.0 in | Wt 199.8 lb

## 2019-05-17 DIAGNOSIS — Z0000 Encounter for general adult medical examination without abnormal findings: Secondary | ICD-10-CM | POA: Diagnosis not present

## 2019-05-17 DIAGNOSIS — E663 Overweight: Secondary | ICD-10-CM

## 2019-05-17 DIAGNOSIS — E119 Type 2 diabetes mellitus without complications: Secondary | ICD-10-CM

## 2019-05-17 DIAGNOSIS — Z125 Encounter for screening for malignant neoplasm of prostate: Secondary | ICD-10-CM | POA: Diagnosis not present

## 2019-05-17 DIAGNOSIS — R790 Abnormal level of blood mineral: Secondary | ICD-10-CM | POA: Diagnosis not present

## 2019-05-17 LAB — COMPREHENSIVE METABOLIC PANEL
ALT: 63 U/L — ABNORMAL HIGH (ref 0–53)
AST: 29 U/L (ref 0–37)
Albumin: 4.2 g/dL (ref 3.5–5.2)
Alkaline Phosphatase: 49 U/L (ref 39–117)
BUN: 23 mg/dL (ref 6–23)
CO2: 29 mEq/L (ref 19–32)
Calcium: 8.9 mg/dL (ref 8.4–10.5)
Chloride: 103 mEq/L (ref 96–112)
Creatinine, Ser: 0.98 mg/dL (ref 0.40–1.50)
GFR: 77.62 mL/min (ref >60.00)
Glucose, Bld: 96 mg/dL (ref 70–99)
Potassium: 4.5 mEq/L (ref 3.5–5.1)
Sodium: 138 mEq/L (ref 135–145)
Total Bilirubin: 0.5 mg/dL (ref 0.2–1.2)
Total Protein: 6.4 g/dL (ref 6.0–8.3)

## 2019-05-17 LAB — CBC
HCT: 41.3 % (ref 39.0–52.0)
Hemoglobin: 14 g/dL (ref 13.0–17.0)
MCHC: 34 g/dL (ref 30.0–36.0)
MCV: 90.5 fl (ref 78.0–100.0)
Platelets: 188 10*3/uL (ref 150.0–400.0)
RBC: 4.57 Mil/uL (ref 4.22–5.81)
RDW: 13.5 % (ref 11.5–15.5)
WBC: 5.3 10*3/uL (ref 4.0–10.5)

## 2019-05-17 LAB — LIPID PANEL
Cholesterol: 119 mg/dL (ref 0–200)
HDL: 33.1 mg/dL — ABNORMAL LOW (ref >39.00)
LDL Cholesterol: 52 mg/dL (ref 0–99)
NonHDL: 85.51
Total CHOL/HDL Ratio: 4
Triglycerides: 170 mg/dL — ABNORMAL HIGH (ref 0.0–149.0)
VLDL: 34 mg/dL (ref 0.0–40.0)

## 2019-05-17 LAB — IBC + FERRITIN
Ferritin: 99 ng/mL (ref 22.0–322.0)
Iron: 108 ug/dL (ref 42–165)
Saturation Ratios: 33 % (ref 20.0–50.0)
Transferrin: 234 mg/dL (ref 212.0–360.0)

## 2019-05-17 LAB — MICROALBUMIN / CREATININE URINE RATIO
Creatinine,U: 101.5 mg/dL
Microalb Creat Ratio: 0.7 mg/g (ref 0.0–30.0)
Microalb, Ur: <0.7 mg/dL (ref 0.0–1.9)

## 2019-05-17 LAB — HEMOGLOBIN A1C: Hgb A1c MFr Bld: 6.4 % (ref 4.6–6.5)

## 2019-05-17 LAB — PSA: PSA: 1.04 ng/mL (ref 0.10–4.00)

## 2019-05-17 MED ORDER — METFORMIN HCL 500 MG PO TABS: 500.0000 mg | ORAL_TABLET | Freq: Every day | ORAL | 3 refills | Status: DC

## 2019-05-17 MED ORDER — TAMSULOSIN HCL 0.4 MG PO CAPS: 0.4000 mg | ORAL_CAPSULE | Freq: Every day | ORAL | 3 refills | Status: DC

## 2019-05-17 MED ORDER — CLOPIDOGREL BISULFATE 75 MG PO TABS: 75.0000 mg | ORAL_TABLET | Freq: Every day | ORAL | 3 refills | Status: DC

## 2019-05-17 MED ORDER — PAROXETINE HCL 20 MG PO TABS: 20.0000 mg | ORAL_TABLET | Freq: Every day | ORAL | 3 refills | Status: DC

## 2019-05-17 MED ORDER — FAMOTIDINE 20 MG PO TABS: ORAL_TABLET | ORAL | 3 refills | Status: DC

## 2019-05-17 MED ORDER — ROSUVASTATIN CALCIUM 40 MG PO TABS: 40.0000 mg | ORAL_TABLET | Freq: Every day | ORAL | 3 refills | Status: DC

## 2019-05-17 NOTE — Patient Instructions (Addendum)
Health Maintenance Due  Topic Date Due  . OPHTHALMOLOGY EXAM - January 2019- he needs updated exam- he will call to schedule 01/06/2019  . URINE MICROALBUMIN Done today orders placed! 05/05/2019  . HEMOGLOBIN A1C Done today 05/16/2019  Patient given a VIS for Shingrix - defer for now with covid 19  Weight up slightly- you mentioned cutting down on some of the snacking/covid extras   Please stop by lab before you go If you do not have mychart- we will call you about results within 5 business days of [place] receiving them.  If you have mychart- we will send your results within 3 business days of [place] receiving them.  If abnormal or we want to clarify a result, we will call or mychart you to make sure you receive the message.  If you have questions or concerns or don't hear within 5-7 days, please send [place] a message or call us.

## 2019-05-17 NOTE — Progress Notes (Signed)
Phone: [phone]   Subjective:  Patient presents today for their annual physical. Chief complaint-noted.   See problem oriented charting- ROS- full  review of systems was completed and negative including No chest pain or shortness of breath. No headache or blurry vision.   The following were reviewed and entered/updated in epic: Past Medical History:  Diagnosis Date  . Allergy   . Anxiety   . Arthritis   . Depression (disease)   . Diabetes mellitus ([place])   . Erosive esophagitis   . GERD (gastroesophageal reflux disease)   . Hemorrhoids   . History of cerebrovascular accident   . Hyperlipidemia   . Peripheral vascular disease ([place])   . Stroke ([place])    2001  . Subclavian artery stenosis, right ([place])   . Tubular adenoma polyp of rectum 07/2010   Patient Active Problem List   Diagnosis Date Noted  . Subclavian artery stenosis, right ([place]) 05/04/2018    Priority: High  . Type II diabetes mellitus, well controlled ([place]) 12/11/2014    Priority: High  . History of CVA (cerebrovascular accident) 07/26/2007    Priority: High  . BPH associated with nocturia 04/24/2016    Priority: Medium  . Depression 08/21/2008    Priority: Medium  . Hyperlipidemia 07/26/2007    Priority: Medium  . Chondromalacia 11/05/2017    Priority: Low  . History of adenomatous polyp of colon 04/24/2016    Priority: Low  . GERD 07/26/2007    Priority: Low  . Visual disturbance 01/07/2018   Past Surgical History:  Procedure Laterality Date  . colon polyp removal  1967  . COLONOSCOPY      Family History  Problem Relation Age of Onset  . Parkinson's disease Mother   . Coronary artery disease Father   . Heart attack Father 33       infection related potentially  . Heart disease Father   . Heart disease Maternal Grandfather        unknown age, MI at some age  . Heart attack Maternal Grandfather   . Diabetes Maternal Grandmother   . Colon cancer Neg Hx   . Esophageal cancer Neg Hx   .  Stomach cancer Neg Hx   . Rectal cancer Neg Hx     Medications- reviewed and updated Current Outpatient Medications  Medication Sig Dispense Refill  . aspirin 325 MG EC tablet Take 325 mg by mouth daily.    . clopidogrel (PLAVIX) 75 MG tablet Take 1 tablet (75 mg total) by mouth daily. 90 tablet 3  . famotidine (PEPCID) 20 MG tablet TAKE 1 TABLET BY MOUTH TWICE DAILY TO BE TAKEN WITH NSAID 180 tablet 3  . ferrous sulfate 325 (65 FE) MG EC tablet Take 325 mg by mouth daily with breakfast.    . fish oil-omega-3 fatty acids 1000 MG capsule Take 3 g by mouth daily.      . metFORMIN (GLUCOPHAGE) 500 MG tablet Take 1 tablet (500 mg total) by mouth daily with breakfast. 90 tablet 3  . niacin 500 MG tablet Take 500 mg by mouth. 2 po at bedtime      . PARoxetine (PAXIL) 20 MG tablet Take 1 tablet (20 mg total) by mouth daily. 90 tablet 3  . rosuvastatin (CRESTOR) 40 MG tablet Take 1 tablet (40 mg total) by mouth daily. 90 tablet 3  . tamsulosin (FLOMAX) 0.4 MG CAPS capsule Take 1 capsule (0.4 mg total) by mouth daily. 90 capsule 3   No current facility-administered  medications for this visit.     Allergies-reviewed and updated No Known Allergies  Social History   Social History Narrative   Married (spouse at outside practice), 1 son in graduate school in 2020, other son in [place] for job in covid environment.       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 Oropharynx normal Neck: no thyromegaly CV: slightly bradycardic but regular no murmurs rubs or gallops Lungs: CTAB no crackles, wheeze,  rhonchi Abdomen: soft/nontender/nondistended/normal bowel sounds. No rebound or guarding.  Ext: no edema Skin: warm, dry, monitor one likely SK on left mid back- darker at left most border- if darkens/enlarges further we discussed possible biopsy Neuro: grossly normal, moves all extremities, PERRLA    Assessment and Plan  63 y.o. male presenting for annual physical.  Health Maintenance counseling: 1. Anticipatory guidance: Patient counseled regarding regular dental exams -q6 months, eye exams -yearly,  avoiding smoking and second hand smoke , limiting alcohol to 2 beverages per day -doesn't drink currently.   2. Risk factor reduction:  Advised patient of need for regular exercise and diet rich and fruits and vegetables to reduce risk of heart attack and stroke. Exercise- has been hard as cant get to the gym- has tried to do a lot of yard work and doing a fair amount of working- can run slowly as long as he ices his knee and takes aleve (does twice a week). Diet-  Weight up 7 lbs from last year. - some overeating with covid 19 Wt Readings from Last 3 Encounters:  05/17/19 199 lb 12.8 oz (90.6 kg)  12/15/18 197 lb (89.4 kg)  11/15/18 197 lb 12.8 oz (89.7 kg)  3. Immunizations/screenings/ancillary studies- shingrix discussed - will hold off due to potential side effects/overlap with covid 19 Immunization History  Administered Date(s) Administered  . Influenza Whole 10/17/2009, 09/28/2012  . Influenza-Unspecified 10/12/2014, 10/09/2016, 09/21/2017, 10/13/2018  . Pneumococcal Polysaccharide-23 11/15/2018  . Td 12/29/1997, 01/27/2008  . Tdap 05/04/2018   Health Maintenance Due  Topic Date Due  . OPHTHALMOLOGY EXAM - January 2019- he needs updated exam- he will call to schedule 01/06/2019  . URINE MICROALBUMIN - with labs today 05/05/2019  . HEMOGLOBIN A1C - with labs today 05/16/2019   4. Prostate cancer screening-  will trend psa- some BPH on flomax. No exam done today.  Lab Results   Component Value Date   PSA 1.05 05/04/2018   PSA 1.04 04/20/2017   PSA 1.01 04/17/2016   5. Colon cancer screening - 10/2016 with 5 year repeat 6. Skin cancer screening- waist up exam today-advise regular sunscreen use. Denies worrisome, changing, or new skin lesions.  7. never smoker  Status of chronic or acute concerns   Anemia in past. Now on iron. Up to date on colonoscopy. Ferritin much improved on iron last check  Tympanostomy tube 2017 in left ear- still in place  Believed- I could nto visualize today  R knee pain- runners knee- has seen ortho in past -  doing reasonably well even with some light running  Subclavian artery stenosis- no recurrence of dizzy/visual episodes with remaining well hydrated. Has seen enuro in the past. Had referred to vascular surgery  -BP should be done in left arm - vascular said no follow up studies! Only if symptomatic  Diabetes- controlled on metformin 500mg  daily- update a1c today  Depression- controlled on paxil 20mg  with phq9 of 0. No SI. Continue current meds- in full remission. Offered reduction to 20mg .   GERD- advised ranitidine trial last year I/o protonix- with info now on ranitidine- doing well on pepcid  HLD- LDL target under 70- on crestor 40mg  and  And fish oil.Marland Kitchen Update cholesterol  BPH- flomax is mildly helpful  Return in about 6 months (around 11/17/2019). as long as a1c still looks so good Then 1 year physical  Lab/Order associations: fasting  Preventative health care - Plan: Microalbumin / creatinine urine ratio, CBC, Comprehensive metabolic panel, Lipid panel, PSA, Hemoglobin A1c, IBC + Ferritin  Type II diabetes mellitus, well controlled ([place]) - Plan: Microalbumin / creatinine urine ratio, CBC, Comprehensive metabolic panel, Lipid panel, Hemoglobin A1c  Screening for prostate cancer - Plan: PSA  Low ferritin - Plan: IBC + Ferritin  Overweight  Meds ordered this encounter  Medications  . metFORMIN (GLUCOPHAGE) 500  MG tablet    Sig: Take 1 tablet (500 mg total) by mouth daily with breakfast.    Dispense:  90 tablet    Refill:  3  . PARoxetine (PAXIL) 20 MG tablet    Sig: Take 1 tablet (20 mg total) by mouth daily.    Dispense:  90 tablet    Refill:  3  . rosuvastatin (CRESTOR) 40 MG tablet    Sig: Take 1 tablet (40 mg total) by mouth daily.    Dispense:  90 tablet    Refill:  3  . tamsulosin (FLOMAX) 0.4 MG CAPS capsule    Sig: Take 1 capsule (0.4 mg total) by mouth daily.    Dispense:  90 capsule    Refill:  3  . clopidogrel (PLAVIX) 75 MG tablet    Sig: Take 1 tablet (75 mg total) by mouth daily.    Dispense:  90 tablet    Refill:  3  . famotidine (PEPCID) 20 MG tablet    Sig: TAKE 1 TABLET BY MOUTH TWICE DAILY TO BE TAKEN WITH NSAID    Dispense:  180 tablet    Refill:  3    Return precautions advised.  Garret Reddish, MD

## 2019-10-23 IMAGING — MR MR MRA NECK WO/W CM
3 series · 21 of 48 positions shown · IV contrast (multihance)
Comparison: Brain MRI 02/01/2018.

CLINICAL DATA: 61-year-old male with prior cerebral infarct. Four
episodes of blurred/abnormal vision in [REDACTED] and November 2017.
known right subclavian artery stenosis. Query vertebrobasilar
insufficiency.

Creatinine was obtained on site at [HOSPITAL] at [HOSPITAL].
Results: Creatinine 0.9 mg/dL.
EXAM:
MRA NECK WITHOUT AND WITH CONTRAST
MRA HEAD WITHOUT CONTRAST
TECHNIQUE: Noncontrast time-of-flight MRA images of the head were obtained.
Multiplanar and multiecho pulse sequences of the neck were obtained
without and with intravenous contrast. Angiographic images of the
neck were obtained using MRA technique without and with intravenous
contrast.
CONTRAST:  17mL MULTIHANCE GADOBENATE DIMEGLUMINE 529 MG/ML IV SOLN

[Series 3: tof_3d_multi-slab · axial · 0.7mm · 0.37mm/px · z∈[-61,+47]mm · 11 of 169 slices shown]
[im 7/169]
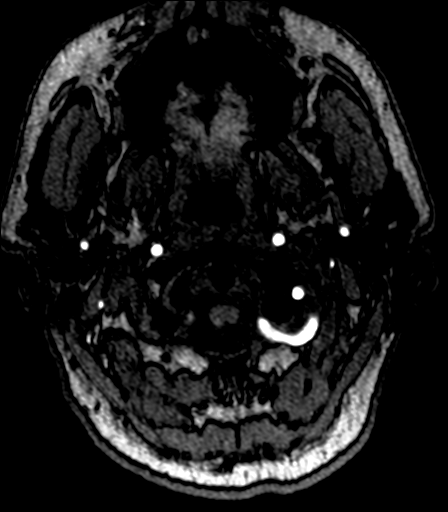
[im 26/169]
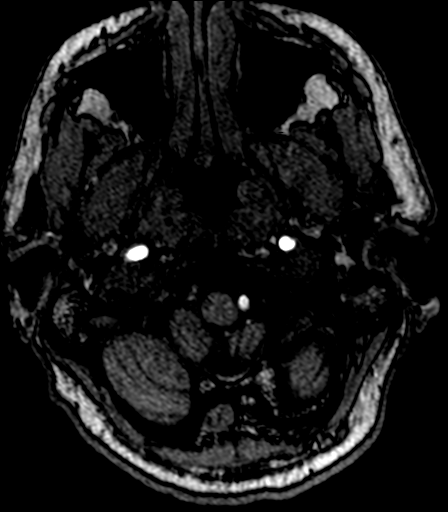
[im 33/169]
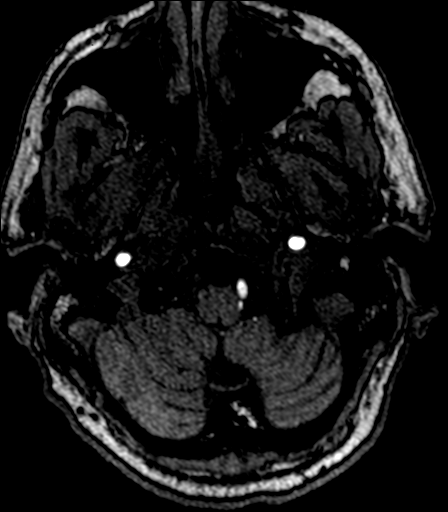
[im 52/169]
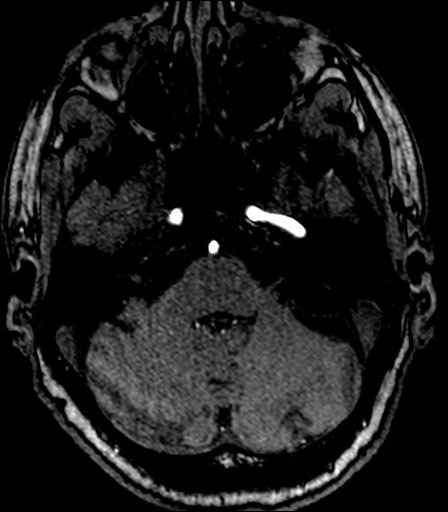
[im 72/169]
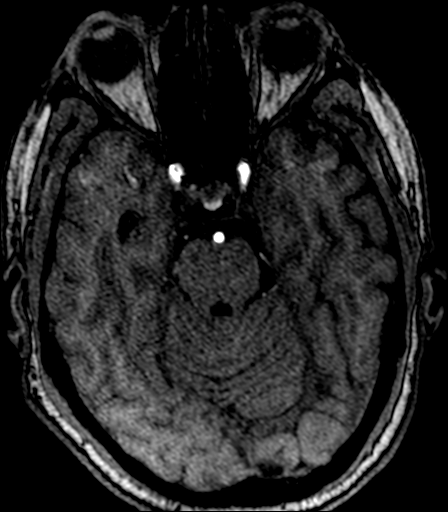
[im 85/169]
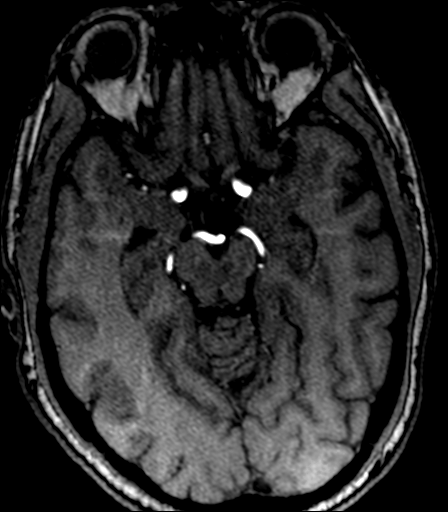
[im 97/169]
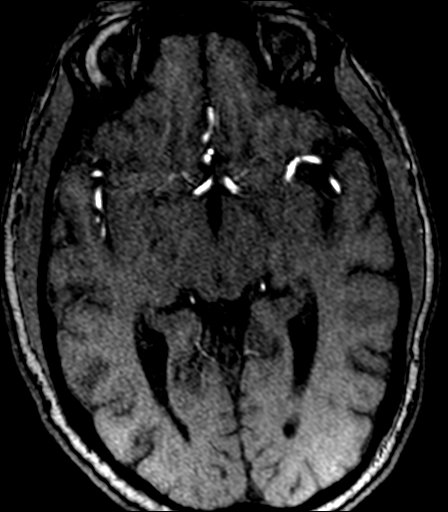
[im 117/169]
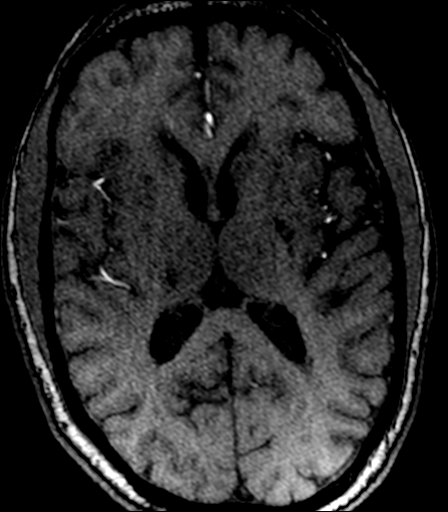
[im 136/169]
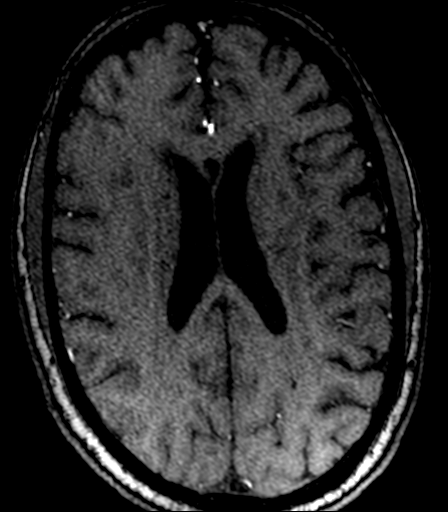
[im 143/169]
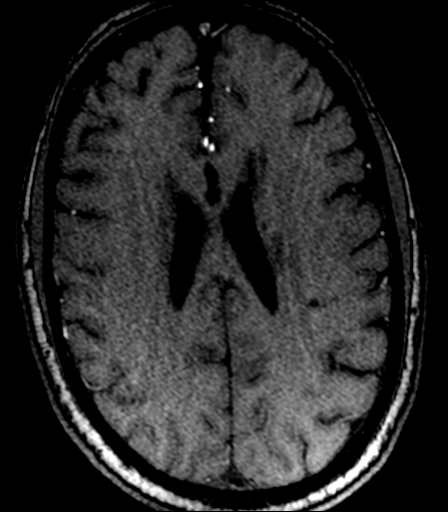
[im 162/169]
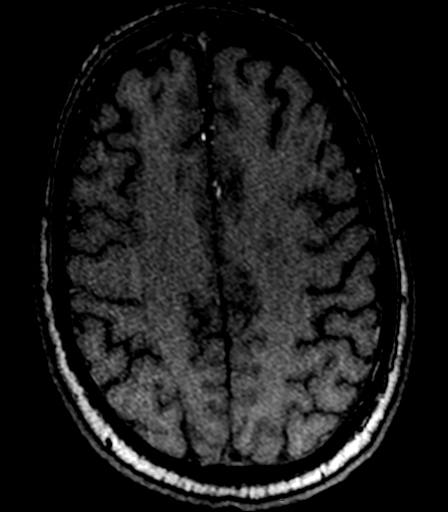

[Series 10: fl_tof_2d · axial · 3.0mm · 0.39mm/px · z∈[-198,-115]mm · 7 of 50 slices shown]
[im 1/50]
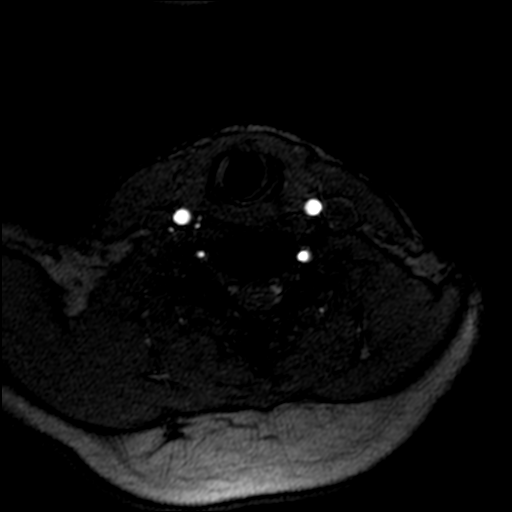
[im 8/50]
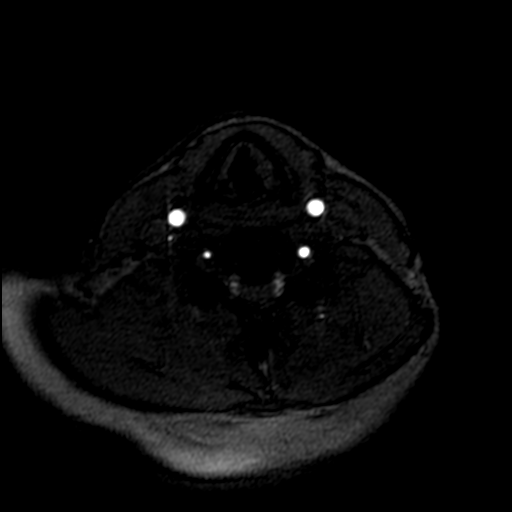
[im 15/50]
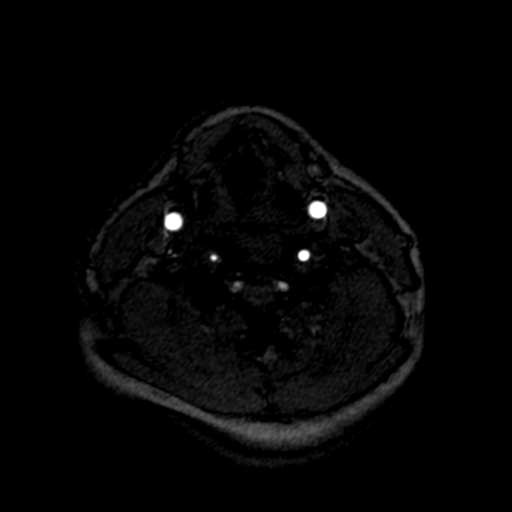
[im 22/50]
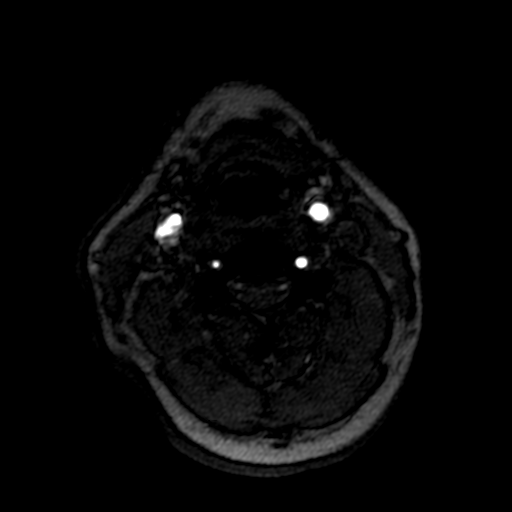
[im 29/50]
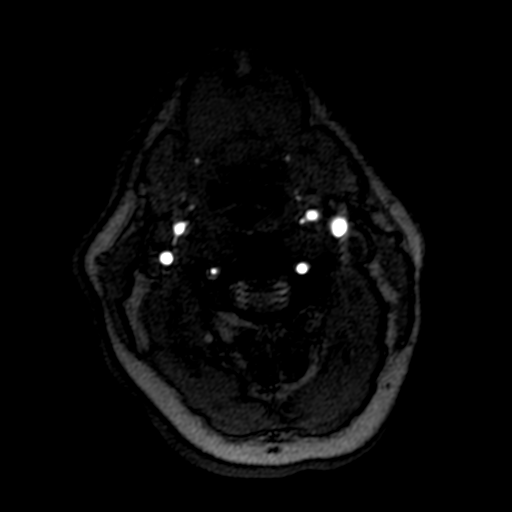
[im 36/50]
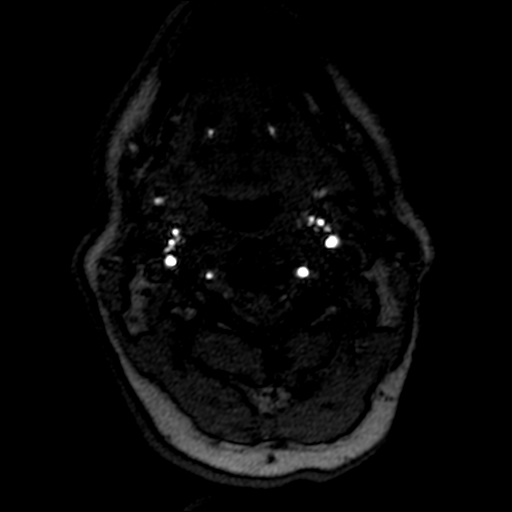
[im 43/50]
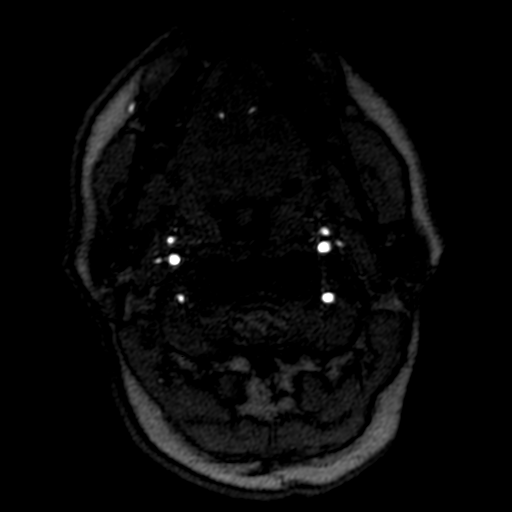

[Series 16: (id)_tt=1.0s · coronal · 0.8mm · 0.83mm/px · 3 of 85 slices shown]
[im 15/85]
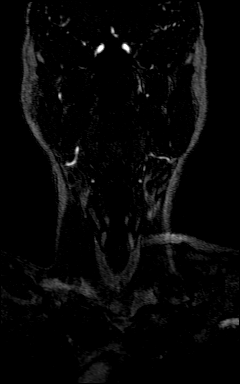
[im 43/85]
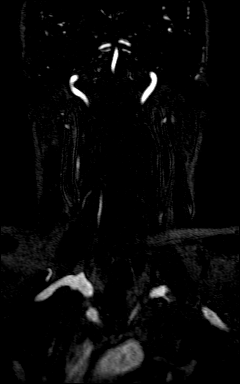
[im 71/85]
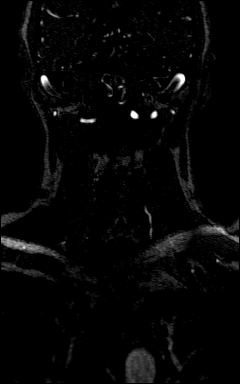

[21 of 48 positions shown; findings below may reference images not displayed]

FINDINGS: MRA NECK:

Precontrast time-of-flight neck MRA images reveal antegrade flow in
both cervical carotid and vertebral arteries. The left vertebral
artery is dominant. The carotid bifurcations appear relatively
normal.

Post-contrast neck MRA images reveal a 3 vessel arch configuration.
The proximal common carotid and left subclavian arteries appear
patent without significant stenosis. There is an 8-10 millimeter
segment of substantial irregularity at the right subclavian artery
origin with stenosis estimated at 70 % with respect to the distal
vessel. See series 25, image 14.

The right vertebral artery origin remains normal. The left vertebral
artery origin appears normal. The left vertebral artery is dominant
throughout the neck. No cervical vertebral artery stenosis is
identified.

The right CCA, right carotid bifurcation, and cervical right ICA
appear normal. The left CCA, left carotid bifurcation, and cervical
left ICA appear normal.

MRA HEAD:

No intracranial mass effect or ventriculomegaly is evident.

Dominant distal left vertebral artery supplies the basilar with no
stenosis. Normal left PICA origin.

The non dominant distal right vertebral artery is better
demonstrated on the post-contrast neck MRA images today (series 25,
image 10) which reveal tandem moderate to severe right vertebral V4
segment stenoses both proximal and especially distal to the right
PICA origin which remains patent. The basilar artery is patent
without stenosis. Normal SCA and PCA origins. Posterior
communicating arteries are diminutive or absent. The right PCA
branches are normal. There is moderate to severe stenosis in the
left PCA P3 segments, although preserved distal flow. See series 22,
image 5 and also series 28, image 5.

Antegrade flow in both ICA siphons. No siphon stenosis. Normal
ophthalmic artery origins. Patent carotid termini. Normal MCA and
ACA origins. The anterior communicating artery and visible bilateral
ACA branches appear normal. The left MCA M1 segment, bifurcation,
and visible left MCA branches appear normal. The right MCA M1
segment, right MCA bifurcation, and visible right MCA branches
appear normal.
IMPRESSION: MRA NECK:

1. Atherosclerosis and stenosis affecting the right subclavian and
non dominant right vertebral arteries.
- 70% stenosis of the proximal Right Subclavian Artery.
- normal Right Vertebral Artery origin, and no cervical right
vertebral stenosis.
- however, there are tandem moderate to severe distal Right
Vertebral Artery V4 segment stenoses both proximal and distal to the
Right PICA, which remains patent.
2. Dominant Left vertebral artery is normal. Both cervical carotid
arteries are normal.

MRA HEAD:

1. Evidence of moderate to severe atherosclerotic stenosis of the
Left PCA P3 segments, but preserved distal flow. Query right side
visual symptoms.
2. The Right PCA and the anterior circulation are within normal
limits.

## 2019-11-15 ENCOUNTER — Ambulatory Visit (INDEPENDENT_AMBULATORY_CARE_PROVIDER_SITE_OTHER): Payer: Commercial Managed Care - PPO | Admitting: Family Medicine

## 2019-11-15 ENCOUNTER — Encounter: Payer: Self-pay | Admitting: Family Medicine

## 2019-11-15 ENCOUNTER — Telehealth: Payer: Self-pay | Admitting: Family Medicine

## 2019-11-15 ENCOUNTER — Other Ambulatory Visit: Payer: Self-pay

## 2019-11-15 VITALS — BP 118/68 | HR 52 | Temp 97.7°F | Ht 71.0 in | Wt 197.6 lb

## 2019-11-15 DIAGNOSIS — F3342 Major depressive disorder, recurrent, in full remission: Secondary | ICD-10-CM

## 2019-11-15 DIAGNOSIS — E119 Type 2 diabetes mellitus without complications: Secondary | ICD-10-CM

## 2019-11-15 DIAGNOSIS — E785 Hyperlipidemia, unspecified: Secondary | ICD-10-CM | POA: Diagnosis not present

## 2019-11-15 DIAGNOSIS — N401 Enlarged prostate with lower urinary tract symptoms: Secondary | ICD-10-CM

## 2019-11-15 DIAGNOSIS — R351 Nocturia: Secondary | ICD-10-CM

## 2019-11-15 LAB — COMPREHENSIVE METABOLIC PANEL
ALT: 58 U/L — ABNORMAL HIGH (ref 0–53)
AST: 31 U/L (ref 0–37)
Albumin: 4.5 g/dL (ref 3.5–5.2)
Alkaline Phosphatase: 53 U/L (ref 39–117)
BUN: 17 mg/dL (ref 6–23)
CO2: 29 mEq/L (ref 19–32)
Calcium: 9.4 mg/dL (ref 8.4–10.5)
Chloride: 102 mEq/L (ref 96–112)
Creatinine, Ser: 0.95 mg/dL (ref 0.40–1.50)
GFR: 80.33 mL/min (ref >60.00)
Glucose, Bld: 112 mg/dL — ABNORMAL HIGH (ref 70–99)
Potassium: 4.4 mEq/L (ref 3.5–5.1)
Sodium: 139 mEq/L (ref 135–145)
Total Bilirubin: 0.6 mg/dL (ref 0.2–1.2)
Total Protein: 6.7 g/dL (ref 6.0–8.3)

## 2019-11-15 LAB — POCT GLYCOSYLATED HEMOGLOBIN (HGB A1C): Hemoglobin A1C: 5.9 % — AB (ref 4.0–5.6)

## 2019-11-15 LAB — LDL CHOLESTEROL, DIRECT: Direct LDL: 61 mg/dL

## 2019-11-15 MED ORDER — FINASTERIDE 5 MG PO TABS: 5.0000 mg | ORAL_TABLET | Freq: Every day | ORAL | 5 refills | Status: DC

## 2019-11-15 NOTE — Progress Notes (Signed)
Phone [phone] In person visit   Subjective:   Darrell Gaines is a 63 y.o. year old very pleasant male patient who presents for/with See problem oriented charting Chief Complaint  Patient presents with  . Follow-up  . Diabetes  . Hyperlipidemia  . Depression   ROS- No chest pain or shortness of breath. No headache or blurry vision.    Past Medical History-  Patient Active Problem List   Diagnosis Date Noted  . Subclavian artery stenosis, right ([place]) 05/04/2018    Priority: High  . Type II diabetes mellitus, well controlled ([place]) 12/11/2014    Priority: High  . History of CVA (cerebrovascular accident) 07/26/2007    Priority: High  . BPH associated with nocturia 04/24/2016    Priority: Medium  . Depression 08/21/2008    Priority: Medium  . Hyperlipidemia 07/26/2007    Priority: Medium  . Chondromalacia 11/05/2017    Priority: Low  . History of adenomatous polyp of colon 04/24/2016    Priority: Low  . GERD 07/26/2007    Priority: Low  . Visual disturbance 01/07/2018    Medications- reviewed and updated Current Outpatient Medications  Medication Sig Dispense Refill  . aspirin 325 MG EC tablet Take 325 mg by mouth daily.    . clopidogrel (PLAVIX) 75 MG tablet Take 1 tablet (75 mg total) by mouth daily. 90 tablet 3  . famotidine (PEPCID) 20 MG tablet TAKE 1 TABLET BY MOUTH TWICE DAILY TO BE TAKEN WITH NSAID 180 tablet 3  . ferrous sulfate 325 (65 FE) MG EC tablet Take 325 mg by mouth daily with breakfast.    . fish oil-omega-3 fatty acids 1000 MG capsule Take 3 g by mouth daily.      . metFORMIN (GLUCOPHAGE) 500 MG tablet Take 1 tablet (500 mg total) by mouth daily with breakfast. 90 tablet 3  . niacin 500 MG tablet Take 500 mg by mouth. 2 po at bedtime      . PARoxetine (PAXIL) 20 MG tablet Take 1 tablet (20 mg total) by mouth daily. 90 tablet 3  . rosuvastatin (CRESTOR) 40 MG tablet Take 1 tablet (40 mg total) by mouth daily. 90 tablet 3  . tamsulosin (FLOMAX)  0.4 MG CAPS capsule Take 1 capsule (0.4 mg total) by mouth daily. 90 capsule 3   No current facility-administered medications for this visit.      Objective:  BP 118/68   Pulse (!) 52   Temp 97.7 F (36.5 C)   Ht 5\' 11"  (1.803 m)   Wt 197 lb 9.6 oz (89.6 kg)   SpO2 97%   BMI 27.56 kg/m  Gen: NAD, resting comfortably CV: RRR no murmurs rubs or gallops Lungs: CTAB no crackles, wheeze, rhonchi Abdomen: soft/nontender/nondistended/normal bowel sounds.  Ext: no edema Skin: warm, dry Rectal: normal tone, diffusely enlarged prostate, no masses or tenderness     Assessment and Plan   # Diabetes S: Compliant with metformin 500mg . No low blood sugars Exercise and diet- pt states diet and exercise are good, he walks 2.5 mi daily and he cooks at home. Lab Results  Component Value Date   HGBA1C 5.9 (A)     POC 11/15/2019   HGBA1C 6.4 05/17/2019   HGBA1C 5.8 (A) 11/15/2018   A/P: Stable. Continue current medications.   #hyperlipidemia S: compliant with Crestor/rosuvastatin 40mg , fish oil 1000mg , niacin Lab Results  Component Value Date   CHOL 119 05/17/2019   HDL 33.10 (L) 05/17/2019   LDLCALC 52 05/17/2019  LDLDIRECT 83.0 11/15/2018   TRIG 170.0 (H) 05/17/2019   CHOLHDL 4 05/17/2019   A/P: doing well with LDL under 70. Update LFTs due to mild elevation last visit, will also check direct LDL Lab Results  Component Value Date   ALT 63 (H) 05/17/2019   AST 29 05/17/2019   ALKPHOS 49 05/17/2019   BILITOT 0.5 05/17/2019   # Depression S: pt thinks his depression is going ok, hasnt had any flare ups. Compliant with paxil 20 mg Depression screen [facility] 2/9 11/15/2019  Decreased Interest 0  Down, Depressed, Hopeless 0  PHQ - 2 Score 0  Altered sleeping 0  Tired, decreased energy 0  Change in appetite 0  Feeling bad or failure about yourself  0  Trouble concentrating 0  Moving slowly or fidgety/restless 0  Suicidal thoughts 0  PHQ-9 Score 0  Difficult doing work/chores  Not difficult at all  A/P: well controlled today. Continue current medicine- we discussed possibly trying 10 mg dose when things are more stable in our world  # BPH S:nocturia 4-5x a night even on flomax Lab Results  Component Value Date   PSA 1.04 05/17/2019   PSA 1.05 05/04/2018   PSA 1.04 04/20/2017  A/P: discussed a few options 1. Urology refer 2. Try finasteride 3. Continue to monitor -Patient would like to try finasteride but at lower dose-we will send in 5 mg but he will try half tablet-if tolerates well without side effects can always try a full tablet.  We discussed may take 6 months to see full results.  We will check in at physical  Recommended follow up:  6 month physical- or sooner if needed  Lab/Order associations:   ICD-10-CM   1. Type II diabetes mellitus, well controlled ([place])  E11.9 POCT HgB A1C  2. Hyperlipidemia, unspecified hyperlipidemia type  E78.5 Comprehensive metabolic panel    Direct LDL  3. Recurrent major depressive disorder, in full remission ([place])  F33.42   4. BPH associated with nocturia  N40.1    R35.1    Return precautions advised.  Garret Reddish, MD

## 2019-11-15 NOTE — Addendum Note (Signed)
Addended by: Marin Olp on: 11/15/2019 09:47 AM   Modules accepted: Orders

## 2019-11-15 NOTE — Telephone Encounter (Signed)
Copied from Center [id number]. Topic: General - Inquiry >> Nov 15, 2019  3:16 PM Richardo Priest, [place] wrote: Reason for CRM: Pt called in stating he would like clarification for his new medication. Pt stated PCP stated it will take a bit to build up so pt is wanting to know if he should continue flomax and wait for this to work or start this right away. Please advise.

## 2019-11-15 NOTE — Patient Instructions (Addendum)
Health Maintenance Due  Topic Date Due  . OPHTHALMOLOGY EXAM -wants to wait due to covid 01/06/2019  . INFLUENZA VACCINE -08/2019 07/30/2019   Recommended follow up:  6 month physical- or sooner if needed  -Patient would like to try finasteride but at lower dose-we will send in 5 mg but he will try half tablet-if tolerates well without side effects can always try a full tablet.  We discussed may take 6 months to see full results.  We will check in at physical  Please stop by lab before you go If you do not have mychart- we will call you about results within 5 business days of [place] receiving them.  If you have mychart- we will send your results within 3 business days of [place] receiving them.  If abnormal or we want to clarify a result, we will call or mychart you to make sure you receive the message.  If you have questions or concerns or don't hear within 5-7 days, please send [place] a message or call us.   Finasteride (Proscar) tablets What is this medicine? FINASTERIDE (fi NAS teer ide) is used to treat benign prostatic hyperplasia (BPH) in men. This is a condition that causes you to have an enlarged prostate. This medicine helps to control your symptoms, decrease urinary retention, and reduces your risk of needing surgery. When used in combination with certain other medicines, this drug can slow down the progression of your disease. This medicine may be used for other purposes; ask your health care provider or pharmacist if you have questions. COMMON BRAND NAME(S): Proscar What should I tell my health care provider before I take this medicine? They need to know if you have any of these conditions:  liver disease  an unusual or allergic reaction to finasteride, other medicines, foods, dyes, or preservatives  pregnant or trying to get pregnant  breast-feeding How should I use this medicine? Take this medicine by mouth with a glass of water. Follow the directions on the prescription label. You  can take this medicine with or without food. Take your doses at regular intervals. Do not take your medicine more often than directed. Do not stop taking except on the advice of your doctor or health care professional. Talk to your pediatrician regarding the use of this medicine in children. Special care may be needed. Overdosage: If you think you have taken too much of this medicine contact a poison control center or emergency room at once. NOTE: This medicine is only for you. Do not share this medicine with others. What if I miss a dose? If you miss a dose, take it as soon as you can. If it is almost time for your next dose, take only that dose. Do not take double or extra doses. What may interact with this medicine?  saw palmetto or other dietary supplements This list may not describe all possible interactions. Give your health care provider a list of all the medicines, herbs, non-prescription drugs, or dietary supplements you use. Also tell them if you smoke, drink alcohol, or use illegal drugs. Some items may interact with your medicine. What should I watch for while using this medicine? Do not donate blood while you are taking this medicine. This will prevent giving this medicine to a pregnant male through a blood transfusion. Ask your doctor or health care professional when it is safe to donate blood after you stop taking this medicine. Women who are pregnant or may get pregnant must not handle broken  or crushed finasteride tablets. The active ingredient could harm the unborn baby. If a pregnant woman comes into contact with broken or crushed tablets she should check with her doctor or health care professional. Exposure to whole tablets is not expected to cause harm as long as they are not swallowed. Contact your doctor or health care professional if your symptoms do not start to get better. You may need to take this medicine for 6 to 12 months to get the best results. This medicine can  interfere with PSA laboratory tests for prostate cancer. If you are scheduled to have a lab test for prostate cancer, tell your doctor or health care professional that you are taking this medicine. This medicine may increase your risk of getting some cancers, like breast cancer. Talk with your doctor. What side effects may I notice from receiving this medicine? Side effects that you should report to your doctor or health care professional as soon as possible:  any signs of an allergic reaction like rash, itching, hives or swelling of the lips or face  changes in breast like lumps, pain or fluids leaking from the nipple  pain in the testicles Side effects that usually do not require medical attention (report to your doctor or health care professional if they continue or are bothersome):  sexual difficulties like decreased sexual desire or ability to get an erection  small amount of semen released during sex This list may not describe all possible side effects. Call your doctor for medical advice about side effects. You may report side effects to FDA at 1-800-FDA-1088. Where should I keep my medicine? Keep out of the reach of children. Store at room temperature below 30 degrees C (86 degrees F). Protect from light. Keep container tightly closed. Throw away any unused medicine after the expiration date. NOTE: This sheet is a summary. It may not cover all possible information. If you have questions about this medicine, talk to your doctor, pharmacist, or health care provider.  2020 [employer]vier/Gold Standard (2015-08-02 17:24:30)

## 2019-11-16 NOTE — Telephone Encounter (Signed)
Called and lm on pt vm tcb regarding below.

## 2019-11-16 NOTE — Telephone Encounter (Signed)
This is all you said in your note, nothing was mentioned about started right away. -Patient would like to try finasteride but at lower dose-we will send in 5 mg but he will try half tablet-if tolerates well without side effects can always try a full tablet.  We discussed may take 6 months to see full results.  We will check in at physical.  Please advise

## 2019-11-16 NOTE — Telephone Encounter (Signed)
Continue flomax. Use half dose of finasteride. The plan is to use them together (usually you stack these meds instead of substitute) - tell him sorry I did not clarify that.

## 2019-11-18 NOTE — Telephone Encounter (Signed)
Pt calling back. Please leave detailed message or send a mychart message.

## 2019-11-18 NOTE — Telephone Encounter (Signed)
See note

## 2019-11-21 NOTE — Telephone Encounter (Signed)
Mychart message sent to patient.

## 2020-05-14 ENCOUNTER — Other Ambulatory Visit: Payer: Self-pay

## 2020-05-14 ENCOUNTER — Ambulatory Visit (INDEPENDENT_AMBULATORY_CARE_PROVIDER_SITE_OTHER): Payer: Commercial Managed Care - PPO | Admitting: Family Medicine

## 2020-05-14 ENCOUNTER — Encounter: Payer: Self-pay | Admitting: Family Medicine

## 2020-05-14 VITALS — BP 138/82 | HR 53 | Temp 98.1°F | Ht 71.0 in | Wt 201.0 lb

## 2020-05-14 DIAGNOSIS — Z125 Encounter for screening for malignant neoplasm of prostate: Secondary | ICD-10-CM | POA: Diagnosis not present

## 2020-05-14 DIAGNOSIS — R790 Abnormal level of blood mineral: Secondary | ICD-10-CM

## 2020-05-14 DIAGNOSIS — E785 Hyperlipidemia, unspecified: Secondary | ICD-10-CM

## 2020-05-14 DIAGNOSIS — F3342 Major depressive disorder, recurrent, in full remission: Secondary | ICD-10-CM

## 2020-05-14 DIAGNOSIS — N401 Enlarged prostate with lower urinary tract symptoms: Secondary | ICD-10-CM

## 2020-05-14 DIAGNOSIS — R351 Nocturia: Secondary | ICD-10-CM

## 2020-05-14 DIAGNOSIS — E119 Type 2 diabetes mellitus without complications: Secondary | ICD-10-CM | POA: Diagnosis not present

## 2020-05-14 DIAGNOSIS — Z0000 Encounter for general adult medical examination without abnormal findings: Secondary | ICD-10-CM

## 2020-05-14 DIAGNOSIS — Z860101 Personal history of adenomatous and serrated colon polyps: Secondary | ICD-10-CM

## 2020-05-14 DIAGNOSIS — Z8673 Personal history of transient ischemic attack (TIA), and cerebral infarction without residual deficits: Secondary | ICD-10-CM

## 2020-05-14 DIAGNOSIS — I771 Stricture of artery: Secondary | ICD-10-CM | POA: Diagnosis not present

## 2020-05-14 DIAGNOSIS — Z86010 Personal history of colonic polyps: Secondary | ICD-10-CM

## 2020-05-14 LAB — CBC WITH DIFFERENTIAL/PLATELET
Basophils Absolute: 0 10*3/uL (ref 0.0–0.1)
Basophils Relative: 0.3 % (ref 0.0–3.0)
Eosinophils Absolute: 0.1 10*3/uL (ref 0.0–0.7)
Eosinophils Relative: 1.8 % (ref 0.0–5.0)
HCT: 40.3 % (ref 39.0–52.0)
Hemoglobin: 13.8 g/dL (ref 13.0–17.0)
Lymphocytes Relative: 29.9 % (ref 12.0–46.0)
Lymphs Abs: 1.6 10*3/uL (ref 0.7–4.0)
MCHC: 34.3 g/dL (ref 30.0–36.0)
MCV: 89.9 fl (ref 78.0–100.0)
Monocytes Absolute: 0.5 10*3/uL (ref 0.1–1.0)
Monocytes Relative: 8.5 % (ref 3.0–12.0)
Neutro Abs: 3.3 10*3/uL (ref 1.4–7.7)
Neutrophils Relative %: 59.5 % (ref 43.0–77.0)
Platelets: 179 10*3/uL (ref 150.0–400.0)
RBC: 4.48 Mil/uL (ref 4.22–5.81)
RDW: 13.6 % (ref 11.5–15.5)
WBC: 5.5 10*3/uL (ref 4.0–10.5)

## 2020-05-14 LAB — COMPREHENSIVE METABOLIC PANEL
ALT: 47 U/L (ref 0–53)
AST: 26 U/L (ref 0–37)
Albumin: 4.4 g/dL (ref 3.5–5.2)
Alkaline Phosphatase: 48 U/L (ref 39–117)
BUN: 16 mg/dL (ref 6–23)
CO2: 31 mEq/L (ref 19–32)
Calcium: 9.3 mg/dL (ref 8.4–10.5)
Chloride: 103 mEq/L (ref 96–112)
Creatinine, Ser: 1 mg/dL (ref 0.40–1.50)
GFR: 75.59 mL/min (ref >60.00)
Glucose, Bld: 112 mg/dL — ABNORMAL HIGH (ref 70–99)
Potassium: 4.9 mEq/L (ref 3.5–5.1)
Sodium: 142 mEq/L (ref 135–145)
Total Bilirubin: 0.4 mg/dL (ref 0.2–1.2)
Total Protein: 6.3 g/dL (ref 6.0–8.3)

## 2020-05-14 LAB — LIPID PANEL
Cholesterol: 136 mg/dL (ref 0–200)
HDL: 31.8 mg/dL — ABNORMAL LOW (ref >39.00)
NonHDL: 104.25
Total CHOL/HDL Ratio: 4
Triglycerides: 237 mg/dL — ABNORMAL HIGH (ref 0.0–149.0)
VLDL: 47.4 mg/dL — ABNORMAL HIGH (ref 0.0–40.0)

## 2020-05-14 LAB — MICROALBUMIN / CREATININE URINE RATIO
Creatinine,U: 85.6 mg/dL
Microalb Creat Ratio: 0.8 mg/g (ref 0.0–30.0)
Microalb, Ur: <0.7 mg/dL (ref 0.0–1.9)

## 2020-05-14 LAB — LDL CHOLESTEROL, DIRECT: Direct LDL: 67 mg/dL

## 2020-05-14 LAB — HEMOGLOBIN A1C: Hgb A1c MFr Bld: 6.4 % (ref 4.6–6.5)

## 2020-05-14 MED ORDER — FAMOTIDINE 20 MG PO TABS: ORAL_TABLET | ORAL | 3 refills | Status: DC

## 2020-05-14 MED ORDER — METFORMIN HCL 500 MG PO TABS: 500.0000 mg | ORAL_TABLET | Freq: Every day | ORAL | 3 refills | Status: DC

## 2020-05-14 MED ORDER — TAMSULOSIN HCL 0.4 MG PO CAPS: 0.4000 mg | ORAL_CAPSULE | Freq: Every day | ORAL | 3 refills | Status: DC

## 2020-05-14 MED ORDER — PAROXETINE HCL 20 MG PO TABS: 20.0000 mg | ORAL_TABLET | Freq: Every day | ORAL | 3 refills | Status: DC

## 2020-05-14 MED ORDER — CLOPIDOGREL BISULFATE 75 MG PO TABS: 75.0000 mg | ORAL_TABLET | Freq: Every day | ORAL | 3 refills | Status: DC

## 2020-05-14 MED ORDER — ROSUVASTATIN CALCIUM 40 MG PO TABS: 40.0000 mg | ORAL_TABLET | Freq: Every day | ORAL | 3 refills | Status: DC

## 2020-05-14 NOTE — Progress Notes (Signed)
Phone: [phone]   Subjective:  Patient presents today for their annual physical. Chief complaint-noted.   See problem oriented charting- Review of Systems  Constitutional: Negative for chills and fever.  HENT: Negative for ear pain, hearing loss and tinnitus.   Eyes: Negative for blurred vision, double vision and photophobia.  Respiratory: Negative for cough and shortness of breath.   Cardiovascular: Negative for chest pain, palpitations and leg swelling.  Gastrointestinal: Negative for blood in stool, constipation, diarrhea, heartburn, nausea and vomiting.  Genitourinary: Negative for dysuria, frequency and urgency.  Musculoskeletal: Negative for back pain, joint pain and neck pain.  Skin: Negative for rash.  Neurological: Negative for dizziness, seizures and headaches.  Endo/Heme/Allergies: Does not bruise/bleed easily.  Psychiatric/Behavioral: Negative for depression, memory loss, substance abuse and suicidal ideas. The patient does not have insomnia.    The following were reviewed and entered/updated in epic: Past Medical History:  Diagnosis Date  . Allergy   . Anxiety   . Arthritis   . Depression (disease)   . Diabetes mellitus ([place])   . Erosive esophagitis   . GERD (gastroesophageal reflux disease)   . Hemorrhoids   . History of cerebrovascular accident   . Hyperlipidemia   . Peripheral vascular disease ([place])   . Stroke ([place])    2001  . Subclavian artery stenosis, right ([place])   . Tubular adenoma polyp of rectum 07/2010   Patient Active Problem List   Diagnosis Date Noted  . Subclavian artery stenosis, right ([place]) 05/04/2018    Priority: High  . Type II diabetes mellitus, well controlled ([place]) 12/11/2014    Priority: High  . History of CVA (cerebrovascular accident) 07/26/2007    Priority: High  . BPH associated with nocturia 04/24/2016    Priority: Medium  . Depression 08/21/2008    Priority: Medium  . Hyperlipidemia 07/26/2007    Priority: Medium  .  Chondromalacia 11/05/2017    Priority: Low  . History of adenomatous polyp of colon 04/24/2016    Priority: Low  . GERD 07/26/2007    Priority: Low  . Visual disturbance 01/07/2018   Past Surgical History:  Procedure Laterality Date  . colon polyp removal  1967  . COLONOSCOPY      Family History  Problem Relation Age of Onset  . Parkinson's disease Mother   . Coronary artery disease Father   . Heart attack Father 43       infection related potentially  . Heart disease Father   . Heart disease Maternal Grandfather        unknown age, MI at some age  . Heart attack Maternal Grandfather   . Diabetes Maternal Grandmother   . Colon cancer Neg Hx   . Esophageal cancer Neg Hx   . Stomach cancer Neg Hx   . Rectal cancer Neg Hx     Medications- reviewed and updated Current Outpatient Medications  Medication Sig Dispense Refill  . aspirin 325 MG EC tablet Take 325 mg by mouth daily.    . clopidogrel (PLAVIX) 75 MG tablet Take 1 tablet (75 mg total) by mouth daily. 90 tablet 3  . famotidine (PEPCID) 20 MG tablet TAKE 1 TABLET BY MOUTH TWICE DAILY TO BE TAKEN WITH NSAID 180 tablet 3  . ferrous sulfate 325 (65 FE) MG EC tablet Take 325 mg by mouth daily with breakfast.    . fish oil-omega-3 fatty acids 1000 MG capsule Take 3 g by mouth daily.      . metFORMIN (GLUCOPHAGE) 500  MG tablet Take 1 tablet (500 mg total) by mouth daily with breakfast. 90 tablet 3  . niacin 500 MG tablet Take 500 mg by mouth. 2 po at bedtime      . PARoxetine (PAXIL) 20 MG tablet Take 1 tablet (20 mg total) by mouth daily. 90 tablet 3  . rosuvastatin (CRESTOR) 40 MG tablet Take 1 tablet (40 mg total) by mouth daily. 90 tablet 3  . tamsulosin (FLOMAX) 0.4 MG CAPS capsule Take 1 capsule (0.4 mg total) by mouth daily. 90 capsule 3   No current facility-administered medications for this visit.    Allergies-reviewed and updated No Known Allergies  Social History   Social History Narrative   Married  (spouse at outside practice), 1 son in graduate school in 2020, other son in [place] for job in covid environment.       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 Oropharynx normal Neck: no thyromegaly CV: RRR no murmurs rubs or gallops Lungs: CTAB no crackles, wheeze, rhonchi Abdomen: soft/nontender/nondistended/normal bowel sounds. No rebound or guarding.  Ext: no edema Skin: warm, dry Neuro: grossly normal, moves all extremities, PERRLA  Diabetic Foot Exam - Simple   Simple Foot Form Diabetic Foot exam was performed with the following findings: Yes 05/14/2020  8:14 AM  Visual Inspection No deformities, no ulcerations, no other skin breakdown bilaterally: Yes Sensation Testing Intact to touch and monofilament testing bilaterally: Yes Pulse Check Posterior Tibialis and Dorsalis pulse intact bilaterally: Yes Comments       Assessment and Plan  64 y.o. male presenting for annual physical.  Health Maintenance counseling: 1. Anticipatory guidance: Patient counseled regarding regular dental exams -q6 months, eye exams - typically yearly- is going to schedule postponed with covid 19 pandemic,  avoiding smoking and second hand smoke , limiting alcohol to 2 beverages per day - doesn't drink 2. Risk factor reduction:  Advised patient of need for regular exercise and diet rich and fruits and vegetables to reduce risk of heart attack and stroke. Exercise- doing well- 2.5 miles walking most day. Diet-weight up slightly- he is go slightlying to work on reversing this in summer months. He set a goal long  term of getting in 180s Wt Readings from Last 3 Encounters:  05/14/20 201 lb (91.2 kg)  11/15/19 197 lb 9.6 oz (89.6 kg)  05/17/19 199 lb 12.8 oz (90.6 kg)  3. Immunizations/screenings/ancillary studies- covid 19 vaccine  Already received- he will message [place] about his dates.  Immunization History  Administered Date(s) Administered  . Influenza Whole 10/17/2009, 09/28/2012  . Influenza,inj,Quad PF,6+ Mos 09/13/2019  . Influenza-Unspecified 10/12/2014, 10/09/2016, 09/21/2017, 10/13/2018  . Pneumococcal Polysaccharide-23 11/15/2018  . Td 12/29/1997, 01/27/2008  . Tdap 05/04/2018   4. Prostate cancer screening- low risk prior PSA trend.  Update PSA with labs today Lab Results  Component Value Date   PSA 1.04 05/17/2019   PSA 1.05 05/04/2018   PSA 1.04 04/20/2017   5. Colon cancer screening - November 2017 with 5-year repeat  due to adenomatous colon polyp. 6. Skin cancer screening-waist up exam today without obvious worrisome features.  Does not see dermatology.advised regular sunscreen use. Denies worrisome, changing, or new skin lesions.  7.  Never smoker  Status of chronic or acute concerns   #History of stroke-patient remains on Plavix 75 mg   #subclavian artery stenosis-remains on risk factor modification with statin, Plavix.  Originally presented with subclavian steal syndrome with vertigo and blurred vision- no recent issues   # Diabetes S: Medication:Metformin 500 mg CBGs- does not check Exercise and diet- still walking 2.5 miles a day- has been keeping this up for the most part.  Trying to cook at home/reasonably healthy A/P: hopefully controlled-update hemoglobin A1c with labs today   #hyperlipidemia/LFT elevation.  LDL goal under 70 with history of stroke S: Medication:Rosuvastatin 40 mg, fish oil, niacin. A/P: Hopefully controlled-update lipid panel today -Patient with mild AST elevation which we will update with labs today. Does not drink alcohol   # Depression S:  Medication:Paxil 20 mg.  PHQ-9 of 1   A/P: reasonable control- continue current medicines   #BPH S:Nocturia 4-5 times a night and on Flomax last visit.  We added low-dose finasteride 2.5 mg- he took for about 5 months and then stopped- he didn't see a big benefit. Nocturia now 3x a night- he feels like the reduction is more because he is not next to the heater (in the winter) -decreased libido on finasteride A/P: tolerable symptoms on flomax alone- continue current meds  -doesn't feel needs to see urology  #Anemia/low ferritin in the past-patient is on iron.  Up-to-date on colonoscopy.  Ferritin normal last check.  #GERD-doing well on Pepcid regularly   Tympanostomy tube 2017 in left ear-still in place   Recommended follow up: Return in about 6 months (around 11/14/2020) for follow up- or sooner if needed .  Lab/Order associations: fasting   ICD-10-CM   1. Type II diabetes mellitus, well controlled ([place])  E11.9 CBC with Differential/Platelet    Comprehensive metabolic panel    Lipid panel    Hemoglobin A1c    Microalbumin / creatinine urine ratio  2. Hyperlipidemia, unspecified hyperlipidemia type  E78.5 CBC with Differential/Platelet    Comprehensive metabolic panel    Lipid panel  3. Preventative health care  Z00.00 CBC with Differential/Platelet    Comprehensive metabolic panel    Lipid panel    PSA    Hemoglobin A1c    Microalbumin / creatinine urine ratio  4. Subclavian artery stenosis, right ([facility])  I77.1   5. Recurrent major depressive disorder, in full remission ([place])  F33.42   6. BPH associated with nocturia  N40.1    R35.1   7. History of CVA (cerebrovascular accident)  Z86.73   8. History of adenomatous polyp of colon  Z86.010   9. Screening for prostate cancer  Z12.5 PSA  10. Low ferritin  R79.0 IBC + Ferritin    Meds ordered this encounter  Medications  . clopidogrel (PLAVIX) 75 MG tablet    Sig: Take 1 tablet (75 mg total) by mouth daily.    Dispense:  90  tablet    Refill:  3  . famotidine (PEPCID) 20 MG tablet    Sig: TAKE 1 TABLET BY MOUTH TWICE DAILY TO BE TAKEN WITH NSAID    Dispense:  180 tablet    Refill:  3  . metFORMIN (GLUCOPHAGE) 500 MG tablet    Sig: Take 1 tablet (500 mg total) by mouth daily  with breakfast.    Dispense:  90 tablet    Refill:  3  . PARoxetine (PAXIL) 20 MG tablet    Sig: Take 1 tablet (20 mg total) by mouth daily.    Dispense:  90 tablet    Refill:  3  . rosuvastatin (CRESTOR) 40 MG tablet    Sig: Take 1 tablet (40 mg total) by mouth daily.    Dispense:  90 tablet    Refill:  3  . tamsulosin (FLOMAX) 0.4 MG CAPS capsule    Sig: Take 1 capsule (0.4 mg total) by mouth daily.    Dispense:  90 capsule    Refill:  3    Return precautions advised.  Garret Reddish, MD

## 2020-05-14 NOTE — Patient Instructions (Addendum)
Health Maintenance Due  Topic Date Due  . COVID-19 Vaccine (1) will send message with info  Never done  . OPHTHALMOLOGY EXAM  - Held off due to covid - would encourage you to go ahead and get this updated and have them send [place] a copy 01/06/2019   Could consider myfitnesspal to help with weight loss  Please stop by lab before you go If you have mychart- we will send your results within 3 business days of [place] receiving them.  If you do not have mychart- we will call you about results within 5 business days of [place] receiving them.   Recommended follow up: Return in about 6 months (around 11/14/2020) for follow up- or sooner if needed. or if a1c over 7 or 7.5 which I doubt it will be

## 2020-05-15 ENCOUNTER — Other Ambulatory Visit: Payer: Self-pay | Admitting: Family Medicine

## 2020-05-15 LAB — IBC + FERRITIN
Ferritin: 129.7 ng/mL (ref 22.0–322.0)
Iron: 90 ug/dL (ref 42–165)
Saturation Ratios: 28.1 % (ref 20.0–50.0)
Transferrin: 229 mg/dL (ref 212.0–360.0)

## 2020-05-15 LAB — PSA: PSA: 0.92 ng/mL (ref 0.10–4.00)

## 2020-05-24 ENCOUNTER — Other Ambulatory Visit: Payer: Self-pay | Admitting: Family Medicine

## 2020-11-12 NOTE — Progress Notes (Signed)
Phone [phone] In person visit   Subjective:   Darrell Gaines is a 65 y.o. year old very pleasant male patient who presents for/with See problem oriented charting Chief Complaint  Patient presents with  . Diabetes  . Hyperlipidemia  . Hypertension   This visit occurred during the SARS-CoV-2 public health emergency.  Safety protocols were in place, including screening questions prior to the visit, additional usage of staff PPE, and extensive cleaning of exam room while observing appropriate contact time as indicated for disinfecting solutions.   Past Medical History-  Patient Active Problem List   Diagnosis Date Noted  . Subclavian artery stenosis, right ([place]) 05/04/2018    Priority: High  . Type II diabetes mellitus, well controlled ([place]) 12/11/2014    Priority: High  . History of CVA (cerebrovascular accident) 07/26/2007    Priority: High  . BPH associated with nocturia 04/24/2016    Priority: Medium  . Depression 08/21/2008    Priority: Medium  . Hyperlipidemia 07/26/2007    Priority: Medium  . Chondromalacia 11/05/2017    Priority: Low  . History of adenomatous polyp of colon 04/24/2016    Priority: Low  . GERD 07/26/2007    Priority: Low  . Visual disturbance 01/07/2018    Medications- reviewed and updated Current Outpatient Medications  Medication Sig Dispense Refill  . aspirin 325 MG EC tablet Take 325 mg by mouth daily.    . clopidogrel (PLAVIX) 75 MG tablet TAKE 1 TABLET(75 MG) BY MOUTH DAILY 90 tablet 3  . famotidine (PEPCID) 20 MG tablet TAKE 1 TABLET BY MOUTH TWICE DAILY TO BE TAKEN WITH NSAID 180 tablet 3  . ferrous sulfate 325 (65 FE) MG EC tablet Take 325 mg by mouth daily with breakfast.    . fish oil-omega-3 fatty acids 1000 MG capsule Take 3 g by mouth daily.      . metFORMIN (GLUCOPHAGE) 500 MG tablet TAKE 1 TABLET(500 MG) BY MOUTH DAILY WITH BREAKFAST 90 tablet 3  . niacin 500 MG tablet Take 500 mg by mouth. 2 po at bedtime      . PARoxetine  (PAXIL) 20 MG tablet TAKE 1 TABLET(20 MG) BY MOUTH DAILY 90 tablet 3  . rosuvastatin (CRESTOR) 40 MG tablet TAKE 1 TABLET(40 MG) BY MOUTH DAILY 90 tablet 3  . tamsulosin (FLOMAX) 0.4 MG CAPS capsule TAKE 1 CAPSULE(0.4 MG) BY MOUTH DAILY 90 capsule 3   No current facility-administered medications for this visit.     Objective:  BP 122/70   Pulse (!) 56   Temp 97.8 F (36.6 C) (Temporal)   Resp 18   Ht _0  (1.803 m)   Wt 184 lb 3.2 oz (83.6 kg)   SpO2 99%   BMI 25.69 kg/m  Gen: NAD, resting comfortably CV: RRR no murmurs rubs or gallops Lungs: CTAB no crackles, wheeze, rhonchi Abdomen: soft/nontender/nondistended/normal bowel sounds.  Ext: no edema Skin: warm, dry    Assessment and Plan   # Left ear issues- had to have new tympanostomy tube placed- ear wax in swimming got lodged in the old tube. Occurred in July- he is not sure if he will swim again although he has been told he can.   # social update- son getting married in December just before Christmas near [place]- parents and grandparents  # lesion on left low back- has derm visit next month with [facility] derm to evaluate lesion.   #History of stroke-patient remains on Plavix 75 mg  as well as aspirin  325 mg.   #subclavian artery stenosis on right-remains on risk factor modification with rosuvastatin 22m , Plavix 75 mg, aspirin 325 mg .  Originally presented with subclavian steal syndrome with vertigo and blurred vision- no recent episodes .  Appears stable-continue vascular follow-up and current medications. Dr. DScot Dockin 2019 said prn follow up for concerns per patient.   # Diabetes S: Medication:Metformin 500 mg daily CBGs- does not check Exercise and diet- still walking 2.5 miles a day.  Trying to cook at home/reasonably healthy. Last visit discussed myfitnesspal to see if that would help with weight loss as weight was trending up slightly- he states this was really helping along with getting a scale. He is trying  ot maintain at this point.  Wt Readings from Last 3 Encounters:  11/13/20 184 lb 3.2 oz (83.6 kg)  05/14/20 201 lb (91.2 kg)  11/15/19 197 lb 9.6 oz (89.6 kg)   Lab Results  Component Value Date   HGBA1C 6.4 05/14/2020   HGBA1C 5.9 (A) 11/15/2019   HGBA1C 6.4 05/17/2019   A/P: Well-controlled on last check and has lost 17 lbs! likely continue current medications-update A1c with labs today. Discussed possibly stopping or reducing metformin if below 6 but we opted to likely continue current course   #hyperlipidemia/LFT elevation.  LDL goal under 70 with history of stroke S: Medication:Rosuvastatin 40 mg, fish oil, niacin.  Thankfully ALT elevation resolved on last check Lab Results  Component Value Date   CHOL 136 05/14/2020   HDL 31.80 (L) 05/14/2020   LDLCALC 52 05/17/2019   LDLDIRECT 67.0 05/14/2020   TRIG 237.0 (H) 05/14/2020   CHOLHDL 4 05/14/2020   Hepatic Function Latest Ref Rng & Units 05/14/2020 11/15/2019 05/17/2019  Total Protein 6.0 - 8.3 g/dL 6.3 6.7 6.4  Albumin 3.5 - 5.2 g/dL 4.4 4.5 4.2  AST 0 - 37 U/L _0 ALT 0 - 53 U/L 47 58(H) 63(H)  Alk Phosphatase 39 - 117 U/L 48 53 49  Total Bilirubin 0.2 - 1.2 mg/dL 0.4 0.6 0.5  Bilirubin, Direct 0.0 - 0.3 mg/dL - - -   A/P: Suspect stable with LDL under 70-continue current medication and check LDL -Patient with mild ALT elevation in the past but resolved last visit which we will update with labs today  # Depression S: Medication:Paxil 20 mg.  PHQ-9 of 0  [facility] 11/13/2020 05/14/2020 11/15/2019  Decreased Interest 0 0 0  Down, Depressed, Hopeless 0 0 0  PHQ - 2 Score 0 0 0  Altered sleeping 0 0 0  Tired, decreased energy 0 1 0  Change in appetite 0 0 0  Feeling bad or failure about yourself  0 0 0  Trouble concentrating 0 0 0  Moving slowly or fidgety/restless 0 0 0  Suicidal thoughts 0 0 0  PHQ-9 Score 0 1 0  Difficult doing work/chores - Not difficult at all Not difficult at all  A/P: full  remission- discussed possibly going down to 174mbut we opted to discuss again in the spring as winter months can be more challenging   #BPH S:Nocturia 3-4 times a night and on Flomax last visit.  previouslyWe added low-dose finasteride 2.5 mg but did not help Lab Results  Component Value Date   PSA 0.92 05/14/2020   PSA 1.04 05/17/2019   PSA 1.05 05/04/2018  A/P: BPH causing symptoms- finasteride did not help- continue flomax alone- stable.    #Anemia/low ferritin in the past-patient is  on iron.  Up-to-date on colonoscopy.  Ferritin normal last check.  likely stable- defer ferritin to next visit.  Lab Results  Component Value Date   FERRITIN 129.7 05/14/2020   Recommended follow up: Return in about 6 months (around 05/13/2021) for physical or sooner if needed.  Lab/Order associations:   ICD-10-CM   1. Type II diabetes mellitus, well controlled ([place])  E11.9 Hemoglobin A1c    COMPLETE METABOLIC PANEL WITH GFR    CBC With Differential/Platelet  2. Recurrent major depressive disorder, in full remission ([place])  F33.42   3. Hyperlipidemia, unspecified hyperlipidemia type  G54.9 COMPLETE METABOLIC PANEL WITH GFR    CBC With Differential/Platelet    LDL cholesterol, direct  4. Subclavian artery stenosis, right ([facility])  I77.1   5. BPH associated with nocturia  N40.1    R35.1   6. History of CVA (cerebrovascular accident)  Z86.73     Return precautions advised.  Garret Reddish, MD

## 2020-11-12 NOTE — Patient Instructions (Addendum)
Health Maintenance Due  Topic Date Due  . OPHTHALMOLOGY EXAM since you have not had an eye exam within a year, please get one at this time as this is important for your diabetes care. Have them send [place] a copy  01/06/2019   Please stop by lab before you go If you have mychart- we will send your results within 3 business days of [place] receiving them.  If you do not have mychart- we will call you about results within 5 business days of [place] receiving them.  *please note we are currently using [facility] which has a longer processing time than [place] typically so labs may not come back as quickly as in the past *please also note that you will see labs on mychart as soon as they post. I will later go in and write notes on them- will say "notes from Dr. Yong Channel"   No changes today unless labs lead [place] to make changes

## 2020-11-13 ENCOUNTER — Other Ambulatory Visit: Payer: Self-pay

## 2020-11-13 ENCOUNTER — Encounter: Payer: Self-pay | Admitting: Family Medicine

## 2020-11-13 ENCOUNTER — Ambulatory Visit (INDEPENDENT_AMBULATORY_CARE_PROVIDER_SITE_OTHER): Payer: Commercial Managed Care - PPO | Admitting: Family Medicine

## 2020-11-13 VITALS — BP 122/70 | HR 56 | Temp 97.8°F | Resp 18 | Ht 71.0 in | Wt 184.2 lb

## 2020-11-13 DIAGNOSIS — R351 Nocturia: Secondary | ICD-10-CM

## 2020-11-13 DIAGNOSIS — E785 Hyperlipidemia, unspecified: Secondary | ICD-10-CM

## 2020-11-13 DIAGNOSIS — F3342 Major depressive disorder, recurrent, in full remission: Secondary | ICD-10-CM

## 2020-11-13 DIAGNOSIS — N401 Enlarged prostate with lower urinary tract symptoms: Secondary | ICD-10-CM

## 2020-11-13 DIAGNOSIS — I771 Stricture of artery: Secondary | ICD-10-CM

## 2020-11-13 DIAGNOSIS — E119 Type 2 diabetes mellitus without complications: Secondary | ICD-10-CM | POA: Diagnosis not present

## 2020-11-13 DIAGNOSIS — K2100 Gastro-esophageal reflux disease with esophagitis, without bleeding: Secondary | ICD-10-CM

## 2020-11-13 DIAGNOSIS — Z8673 Personal history of transient ischemic attack (TIA), and cerebral infarction without residual deficits: Secondary | ICD-10-CM

## 2020-11-14 LAB — CBC WITH DIFFERENTIAL/PLATELET
Absolute Monocytes: 489 cells/uL (ref 200–950)
Basophils Absolute: 21 cells/uL (ref 0–200)
Basophils Relative: 0.4 %
Eosinophils Absolute: 99 cells/uL (ref 15–500)
Eosinophils Relative: 1.9 %
HCT: 42.4 % (ref 38.5–50.0)
Hemoglobin: 14.3 g/dL (ref 13.2–17.1)
Lymphs Abs: 1825 cells/uL (ref 850–3900)
MCH: 30.9 pg (ref 27.0–33.0)
MCHC: 33.7 g/dL (ref 32.0–36.0)
MCV: 91.6 fL (ref 80.0–100.0)
MPV: 10.3 fL (ref 7.5–12.5)
Monocytes Relative: 9.4 %
Neutro Abs: 2766 cells/uL (ref 1500–7800)
Neutrophils Relative %: 53.2 %
Platelets: 179 10*3/uL (ref 140–400)
RBC: 4.63 10*6/uL (ref 4.20–5.80)
RDW: 13.6 % (ref 11.0–15.0)
Total Lymphocyte: 35.1 %
WBC: 5.2 10*3/uL (ref 3.8–10.8)

## 2020-11-14 LAB — COMPLETE METABOLIC PANEL WITH GFR
AG Ratio: 2.2 (calc) (ref 1.0–2.5)
ALT: 50 U/L — ABNORMAL HIGH (ref 9–46)
AST: 27 U/L (ref 10–35)
Albumin: 4.3 g/dL (ref 3.6–5.1)
Alkaline phosphatase (APISO): 47 U/L (ref 35–144)
BUN: 19 mg/dL (ref 7–25)
CO2: 25 mmol/L (ref 20–32)
Calcium: 9.5 mg/dL (ref 8.6–10.3)
Chloride: 103 mmol/L (ref 98–110)
Creat: 1.11 mg/dL (ref 0.70–1.25)
GFR, Est African American: 81 mL/min/{1.73_m2} (ref >=60)
GFR, Est Non African American: 70 mL/min/{1.73_m2} (ref >=60)
Globulin: 2 g/dL (calc) (ref 1.9–3.7)
Glucose, Bld: 113 mg/dL — ABNORMAL HIGH (ref 65–99)
Potassium: 4.8 mmol/L (ref 3.5–5.3)
Sodium: 138 mmol/L (ref 135–146)
Total Bilirubin: 0.5 mg/dL (ref 0.2–1.2)
Total Protein: 6.3 g/dL (ref 6.1–8.1)

## 2020-11-14 LAB — LDL CHOLESTEROL, DIRECT: Direct LDL: 63 mg/dL (ref <100)

## 2020-11-14 LAB — HEMOGLOBIN A1C
Hgb A1c MFr Bld: 6 % of total Hgb — ABNORMAL HIGH (ref <5.7)
Mean Plasma Glucose: 126 (calc)
eAG (mmol/L): 7 (calc)

## 2021-02-04 ENCOUNTER — Encounter: Payer: Self-pay | Admitting: Gastroenterology

## 2021-05-14 NOTE — Patient Instructions (Addendum)
Please stop by lab before you go If you have mychart- we will send your results within 3 business days of [place] receiving them.  If you do not have mychart- we will call you about results within 5 business days of [place] receiving them.  *please also note that you will see labs on mychart as soon as they post. I will later go in and write notes on them- will say "notes from Dr. Yong Channel"  No changes today unless labs lead [place] to make changes  Team please add pfizer covid vaccination from Saturday- he just had this done  Health Maintenance Due  Topic Date Due  . OPHTHALMOLOGY EXAM - needs to schedule 01/06/2019

## 2021-05-14 NOTE — Progress Notes (Signed)
Phone: [phone]   Subjective:  Patient presents today for their annual physical. Chief complaint-noted.   See problem oriented charting- ROS- full  review of systems was completed and negative  except for: nocturia, some hesitancy  The following were reviewed and entered/updated in epic: Past Medical History:  Diagnosis Date  . Allergy   . Anxiety   . Arthritis   . Depression (disease)   . Diabetes mellitus ([place])   . Erosive esophagitis   . GERD (gastroesophageal reflux disease)   . Hemorrhoids   . History of cerebrovascular accident   . Hyperlipidemia   . Peripheral vascular disease ([place])   . Stroke ([place])    2001  . Subclavian artery stenosis, right ([place])   . Tubular adenoma polyp of rectum 07/2010   Patient Active Problem List   Diagnosis Date Noted  . Subclavian artery stenosis, right ([place]) 05/04/2018    Priority: High  . Type II diabetes mellitus, well controlled ([place]) 12/11/2014    Priority: High  . History of CVA (cerebrovascular accident) 07/26/2007    Priority: High  . BPH associated with nocturia 04/24/2016    Priority: Medium  . Depression 08/21/2008    Priority: Medium  . Hyperlipidemia 07/26/2007    Priority: Medium  . Chondromalacia 11/05/2017    Priority: Low  . History of adenomatous polyp of colon 04/24/2016    Priority: Low  . GERD 07/26/2007    Priority: Low  . Visual disturbance 01/07/2018   Past Surgical History:  Procedure Laterality Date  . colon polyp removal  1967  . COLONOSCOPY      Family History  Problem Relation Age of Onset  . Parkinson's disease Mother   . Coronary artery disease Father   . Heart attack Father 33       infection related potentially  . Heart disease Father   . Heart disease Maternal Grandfather        unknown age, MI at some age  . Heart attack Maternal Grandfather   . Diabetes Maternal Grandmother   . Colon cancer Neg Hx   . Esophageal cancer Neg Hx   . Stomach cancer Neg Hx   . Rectal cancer Neg  Hx     Medications- reviewed and updated Current Outpatient Medications  Medication Sig Dispense Refill  . aspirin 325 MG EC tablet Take 325 mg by mouth daily.    . clopidogrel (PLAVIX) 75 MG tablet TAKE 1 TABLET(75 MG) BY MOUTH DAILY 90 tablet 3  . famotidine (PEPCID) 20 MG tablet TAKE 1 TABLET BY MOUTH TWICE DAILY TO BE TAKEN WITH NSAID 180 tablet 3  . ferrous sulfate 325 (65 FE) MG EC tablet Take 325 mg by mouth daily with breakfast.    . fish oil-omega-3 fatty acids 1000 MG capsule Take 3 g by mouth daily.    . metFORMIN (GLUCOPHAGE) 500 MG tablet TAKE 1 TABLET(500 MG) BY MOUTH DAILY WITH BREAKFAST 90 tablet 3  . niacin 500 MG tablet Take 500 mg by mouth. 1 po at bedtime    . PARoxetine (PAXIL) 20 MG tablet TAKE 1 TABLET(20 MG) BY MOUTH DAILY 90 tablet 3  . rosuvastatin (CRESTOR) 40 MG tablet TAKE 1 TABLET(40 MG) BY MOUTH DAILY 90 tablet 3  . tamsulosin (FLOMAX) 0.4 MG CAPS capsule TAKE 1 CAPSULE(0.4 MG) BY MOUTH DAILY 90 capsule 3   No current facility-administered medications for this visit.    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 Oropharynx normal Neck: no thyromegaly CV:  slightly bradycardic but regular no murmurs rubs or gallops Lungs: CTAB no crackles, wheeze, rhonchi Abdomen: soft/nontender/nondistended/normal bowel sounds. No rebound or guarding.  Ext: no edema Skin: warm, dry Neuro: grossly normal, moves all extremities, PERRLA    Diabetic Foot Exam - Simple   Simple Foot Form Diabetic Foot exam was performed with the following findings: Yes 05/15/2021  8:28 AM  Visual Inspection No deformities, no ulcerations, no other skin breakdown bilaterally: Yes Sensation Testing Intact to touch and monofilament testing bilaterally: Yes Pulse Check Posterior Tibialis and Dorsalis pulse intact bilaterally: Yes Comments        Assessment and Plan  65 y.o. male presenting for annual physical.  Health Maintenance counseling: 1. Anticipatory guidance: Patient counseled regarding regular dental exams -q6 months, eye exams -yearly,  avoiding smoking and second hand smoke , limiting alcohol to 2 beverages per day - very rare- 0 most weeks.   2. Risk factor reduction:  Advised patient of need for regular exercise and diet rich and fruits and vegetables to reduce risk of heart attack and stroke. Exercise- tougher with work stress- having to work through lunch- down to 2-3 days a week. Diet-reasonably healthy diet.  Weight at last physical was 201 and his goal was to get into the 180s Wt Readings from Last 3 Encounters:  05/15/21 185 lb 6.1 oz (84.1 kg)  11/13/20 184 lb 3.2 oz (83.6 kg)  05/14/20 201 lb (91.2 kg)  3. Immunizations/screenings/ancillary studies-discussed Shingrix- just had covid 19 vaccination #4 and we will hold off  Immunization History  Administered Date(s) Administered  . Influenza Whole 10/17/2009, 09/28/2012  . Influenza,inj,Quad PF,6+ Mos 09/13/2019  . Influenza-Unspecified 10/12/2014, 10/09/2016, 09/21/2017, 10/13/2018, 10/10/2020  . PFIZER(Purple Top)SARS-COV-2 Vaccination 03/15/2020, 04/09/2020, 10/13/2020  . Pneumococcal Polysaccharide-23  11/15/2018  . Td 12/29/1997, 01/27/2008  . Tdap 05/04/2018  4. Prostate cancer screening- low risk prior psa trend- update with labs today  Lab Results  Component Value Date   PSA 0.92 05/14/2020   PSA 1.04 05/17/2019   PSA 1.05 05/04/2018   5. Colon cancer screening - November 2017 with 7-year follow-up due to adenomatous colon polyp history-letter from DR. Fuller Plan in epic 6. Skin cancer screening-waist up exam today without obvious worrisome features.advised regular sunscreen use. Denies worrisome, changing, or new skin lesions.  7.  Never smoker 8. STD screening - not needed as only active with wife  Status of chronic or acute concerns   #History of stroke-patient remains on Plavix 75 mg   #subclavian artery stenosis-remains on risk factor modification with statin,  Plavix.  Originally presented with subclavian steal syndrome with vertigo and blurred vision.  Dr. Scot Dock in 2019 said prn follow up for concerns per patient.  -Thankfully no recurrence of symptoms.  Appears stable-continue current medication   # Diabetes S: Medication:Metformin 500 mg CBGs- does not check Exercise and diet- gets his 2.5 miles in if he can but tough with work- down from 4-5 days a week to 2-3 days a week.  Maintained weight loss Lab Results  Component Value Date   HGBA1C 6.0 (H) 11/13/2020   HGBA1C 6.4 05/14/2020   HGBA1C 5.9 (A) 11/15/2019  A/P: Well-controlled on last check-update A1c with labs today -Diabetic eye exam- needs to update   #hyperlipidemia/LFT elevation.  LDL goal under 70 with history of stroke S: Medication:Rosuvastatin 40 mg, fish oil, niacin. Lab Results  Component Value Date   CHOL 136 05/14/2020   HDL 31.80 (L) 05/14/2020   LDLCALC 52 05/17/2019   LDLDIRECT 63 11/13/2020   TRIG 237.0 (H) 05/14/2020   CHOLHDL 4 05/14/2020    Hepatic Function Latest Ref Rng & Units 11/13/2020 05/14/2020 11/15/2019  Total Protein 6.1 - 8.1 g/dL 6.3 6.3 6.7  Albumin 3.5 - 5.2 g/dL - 4.4 4.5   AST 10 - 35 U/L 27 26 31   ALT 9 - 46 U/L 50(H) 47 58(H)  Alk Phosphatase 39 - 117 U/L - 48 53  Total Bilirubin 0.2 - 1.2 mg/dL 0.5 0.4 0.6  Bilirubin, Direct 0.0 - 0.3 mg/dL - - -   A/P: Excellent control of lipids on last check-update again today -Patient with mild AST elevation which we will update with labs today-do not plan to decrease statin unless substantial worsening  # Depression S: Medication:Paxil 20 mg.  PHQ-9 of6  Depression screen [facility] 2/9 05/15/2021 11/13/2020 05/14/2020  Decreased Interest 1 0 0  Down, Depressed, Hopeless 1 0 0  PHQ - 2 Score 2 0 0  Altered sleeping 1 0 0  Tired, decreased energy 1 0 1  Change in appetite 0 0 0  Feeling bad or failure about yourself  1 0 0  Trouble concentrating 1 0 0  Moving slowly or fidgety/restless 0 0 0  Suicidal thoughts 0 0 0  PHQ-9 Score 6 0 1  Difficult doing work/chores Somewhat difficult - Not difficult at all  A/P: patient reports some increased work stress - overall feels paxil working reasonably well- continue current meds for now. PHQ9 slightly high but relate to stress  #BPH S:Nocturia up to 3-5 times a night even  on Flomax last visit.  Mainly 2 waking him up in night and then 1 close to waking up time - in past We added low-dose finasteride 2.5 mg but did not help over 5 months and decreased libido A/P: stable- continue current meds   #Anemia/low ferritin in the past-patient is on iron.  Up-to-date on colonoscopy.  Ferritin normal last check. Just check cbc today  Tympanostomy tube 2017 in left ear-replaced in 2020 after swimming- wax got into tube and was not effective- history of effusion   Recommended follow up: Return in about 6 months (around 11/15/2021) for follow up- or sooner if needed.  Lab/Order associations: fasting   ICD-10-CM   1. Preventative health care  Z00.00   2. Type II diabetes mellitus, well controlled ([place])  E11.9   3. Hyperlipidemia, unspecified hyperlipidemia type  E78.5   4. Recurrent  major depressive disorder, in full remission ([place])  F33.42   5. Screening for prostate cancer  Z12.5     No orders of the defined types were placed in this encounter.   Return precautions advised.  Garret Reddish, MD

## 2021-05-15 ENCOUNTER — Encounter: Payer: Self-pay | Admitting: Family Medicine

## 2021-05-15 ENCOUNTER — Other Ambulatory Visit: Payer: Self-pay

## 2021-05-15 ENCOUNTER — Ambulatory Visit (INDEPENDENT_AMBULATORY_CARE_PROVIDER_SITE_OTHER): Payer: Commercial Managed Care - PPO | Admitting: Family Medicine

## 2021-05-15 VITALS — BP 130/80 | HR 53 | Temp 98.0°F | Ht 71.0 in | Wt 185.4 lb

## 2021-05-15 DIAGNOSIS — E119 Type 2 diabetes mellitus without complications: Secondary | ICD-10-CM | POA: Diagnosis not present

## 2021-05-15 DIAGNOSIS — Z125 Encounter for screening for malignant neoplasm of prostate: Secondary | ICD-10-CM

## 2021-05-15 DIAGNOSIS — F3342 Major depressive disorder, recurrent, in full remission: Secondary | ICD-10-CM

## 2021-05-15 DIAGNOSIS — Z0000 Encounter for general adult medical examination without abnormal findings: Secondary | ICD-10-CM

## 2021-05-15 DIAGNOSIS — I771 Stricture of artery: Secondary | ICD-10-CM

## 2021-05-15 DIAGNOSIS — E785 Hyperlipidemia, unspecified: Secondary | ICD-10-CM

## 2021-05-15 LAB — COMPREHENSIVE METABOLIC PANEL
ALT: 48 U/L (ref 0–53)
AST: 29 U/L (ref 0–37)
Albumin: 4.6 g/dL (ref 3.5–5.2)
Alkaline Phosphatase: 48 U/L (ref 39–117)
BUN: 15 mg/dL (ref 6–23)
CO2: 28 mEq/L (ref 19–32)
Calcium: 9.3 mg/dL (ref 8.4–10.5)
Chloride: 99 mEq/L (ref 96–112)
Creatinine, Ser: 0.93 mg/dL (ref 0.40–1.50)
GFR: 87.38 mL/min (ref >60.00)
Glucose, Bld: 96 mg/dL (ref 70–99)
Potassium: 4.1 mEq/L (ref 3.5–5.1)
Sodium: 137 mEq/L (ref 135–145)
Total Bilirubin: 0.6 mg/dL (ref 0.2–1.2)
Total Protein: 6.7 g/dL (ref 6.0–8.3)

## 2021-05-15 LAB — CBC WITH DIFFERENTIAL/PLATELET
Basophils Absolute: 0 10*3/uL (ref 0.0–0.1)
Basophils Relative: 0.4 % (ref 0.0–3.0)
Eosinophils Absolute: 0.1 10*3/uL (ref 0.0–0.7)
Eosinophils Relative: 1.4 % (ref 0.0–5.0)
HCT: 42.7 % (ref 39.0–52.0)
Hemoglobin: 14.9 g/dL (ref 13.0–17.0)
Lymphocytes Relative: 26.1 % (ref 12.0–46.0)
Lymphs Abs: 1.5 10*3/uL (ref 0.7–4.0)
MCHC: 34.8 g/dL (ref 30.0–36.0)
MCV: 88.5 fl (ref 78.0–100.0)
Monocytes Absolute: 0.5 10*3/uL (ref 0.1–1.0)
Monocytes Relative: 8.1 % (ref 3.0–12.0)
Neutro Abs: 3.7 10*3/uL (ref 1.4–7.7)
Neutrophils Relative %: 64 % (ref 43.0–77.0)
Platelets: 176 10*3/uL (ref 150.0–400.0)
RBC: 4.82 Mil/uL (ref 4.22–5.81)
RDW: 13.2 % (ref 11.5–15.5)
WBC: 5.8 10*3/uL (ref 4.0–10.5)

## 2021-05-15 LAB — MICROALBUMIN / CREATININE URINE RATIO
Creatinine,U: 46 mg/dL
Microalb Creat Ratio: 1.5 mg/g (ref 0.0–30.0)
Microalb, Ur: <0.7 mg/dL (ref 0.0–1.9)

## 2021-05-15 LAB — LIPID PANEL
Cholesterol: 114 mg/dL (ref 0–200)
HDL: 37.3 mg/dL — ABNORMAL LOW (ref >39.00)
LDL Cholesterol: 49 mg/dL (ref 0–99)
NonHDL: 77
Total CHOL/HDL Ratio: 3
Triglycerides: 141 mg/dL (ref 0.0–149.0)
VLDL: 28.2 mg/dL (ref 0.0–40.0)

## 2021-05-15 LAB — PSA: PSA: 1.58 ng/mL (ref 0.10–4.00)

## 2021-05-15 LAB — HEMOGLOBIN A1C: Hgb A1c MFr Bld: 6.4 % (ref 4.6–6.5)

## 2021-05-19 ENCOUNTER — Encounter: Payer: Self-pay | Admitting: Family Medicine

## 2021-06-09 ENCOUNTER — Other Ambulatory Visit: Payer: Self-pay | Admitting: Family Medicine

## 2021-07-15 ENCOUNTER — Other Ambulatory Visit: Payer: Self-pay | Admitting: Family Medicine

## 2021-08-19 ENCOUNTER — Other Ambulatory Visit: Payer: Self-pay

## 2021-08-19 ENCOUNTER — Other Ambulatory Visit (INDEPENDENT_AMBULATORY_CARE_PROVIDER_SITE_OTHER): Payer: Commercial Managed Care - PPO

## 2021-08-19 DIAGNOSIS — Z125 Encounter for screening for malignant neoplasm of prostate: Secondary | ICD-10-CM

## 2021-08-19 LAB — PSA: PSA: 1.38 ng/mL (ref 0.10–4.00)

## 2021-10-14 LAB — HM DIABETES EYE EXAM

## 2021-11-11 NOTE — Progress Notes (Signed)
Phone [phone] In person visit   Subjective:   Darrell Gaines is a 65 y.o. year old very pleasant male patient who presents for/with See problem oriented charting Chief Complaint  Patient presents with   Follow-up   Diabetes   Hyperlipidemia   cold sores in the corner of mouth    Pt c/o "split in corners of lips" that comes and goes and would like a cream for it.    This visit occurred during the SARS-CoV-2 public health emergency.  Safety protocols were in place, including screening questions prior to the visit, additional usage of staff PPE, and extensive cleaning of exam room while observing appropriate contact time as indicated for disinfecting solutions.   Past Medical History-  Patient Active Problem List   Diagnosis Date Noted   Subclavian artery stenosis, right ([place]) 05/04/2018    Priority: High   Type II diabetes mellitus, well controlled ([place]) 12/11/2014    Priority: High   History of CVA (cerebrovascular accident) 07/26/2007    Priority: High   BPH associated with nocturia 04/24/2016    Priority: Medium    Depression 08/21/2008    Priority: Medium    Hyperlipidemia 07/26/2007    Priority: Medium    Chondromalacia 11/05/2017    Priority: Low   History of adenomatous polyp of colon 04/24/2016    Priority: Low   GERD 07/26/2007    Priority: Low   Visual disturbance 01/07/2018    Medications- reviewed and updated Current Outpatient Medications  Medication Sig Dispense Refill   aspirin 325 MG EC tablet Take 325 mg by mouth daily.     blood glucose meter kit and supplies KIT Dispense based on patient and insurance preference. Use up to four times daily as directed. 1 each 0   famotidine (PEPCID) 20 MG tablet TAKE 1 TABLET BY MOUTH TWICE DAILY TO BE TAKEN WITH NSAID 180 tablet 3   ferrous sulfate 325 (65 FE) MG EC tablet Take 325 mg by mouth daily with breakfast.     fish oil-omega-3 fatty acids 1000 MG capsule Take 3 g by mouth daily.     metFORMIN  (GLUCOPHAGE) 500 MG tablet TAKE 1 TABLET(500 MG) BY MOUTH DAILY WITH BREAKFAST 90 tablet 3   Miconazole Nitrate 2 % OINT two times per day for one to three weeks and repeated as necessary for angular cheilitis 141 g 1   niacin 500 MG tablet Take 500 mg by mouth. 1 po at bedtime     PARoxetine (PAXIL) 20 MG tablet TAKE 1 TABLET(20 MG) BY MOUTH DAILY 90 tablet 3   rosuvastatin (CRESTOR) 40 MG tablet TAKE 1 TABLET(40 MG) BY MOUTH DAILY 90 tablet 3   tamsulosin (FLOMAX) 0.4 MG CAPS capsule TAKE 1 CAPSULE(0.4 MG) BY MOUTH DAILY 90 capsule 3   clopidogrel (PLAVIX) 75 MG tablet TAKE 1 TABLET(75 MG) BY MOUTH DAILY 90 tablet 3   No current facility-administered medications for this visit.     Objective:  BP 128/70   Pulse (!) 54   Temp (!) 97.5 F (36.4 C)   Ht 5' 11"  (1.803 m)   Wt 196 lb 3.2 oz (89 kg)   SpO2 98%   BMI 27.36 kg/m  Gen: NAD, resting comfortably CV: bradycardic as per baseline Lungs: CTAB no crackles, wheeze, rhonchi Ext: no edema Skin: warm, dryh    Assessment and Plan   # history of covid-  had it in July- did reasonably well. Later had bivalent booster in October  #  cracks in corner of lips- has tried neosporin without  significant relief. Happens intermittently. Thinks may sleep with mouth open -This certainly sounds like angular cheilitis-does not have an active case right now so cannot do KOH prep-we opted to try miconazole ointment and if this fails to improve his symptoms could switch to mupirocin-he will reach out via MyChart  #History of stroke-patient remained on Plavix 75 mg daily along with aspirin 325    #subclavian artery stenosis-remained on risk factor modification with statin, Plavix.  Originally presented with subclavian steal syndrome with vertigo and blurred vision. Dr. Scot Dock in 2019 said prn follow up for concerned per patient.  - No recent symptoms.  Stable-continue current medication   # Diabetes S: Medication:Metformin 500 mg daily CBGs-  does not check regularly Exercise and diet- walking 30-40 mins about 2 miles about 4-5 days a week. Weight up 11 lbs- a lot of stress eating- thinking about using app on his phone again Lab Results  Component Value Date   HGBA1C 6.4 05/15/2021   HGBA1C 6.0 (H) 11/13/2020   HGBA1C 6.4 05/14/2020   A/P:  hopefully stable- update A1c today. Continue current meds for now   #hyperlipidemia/LFT elevation.  LDL goal under 70 with history of stroke S: Medication:Rosuvastatin 40 mg daily, fish oil, niacin. Lab Results  Component Value Date   CHOL 114 05/15/2021   HDL 37.30 (L) 05/15/2021   LDLCALC 49 05/15/2021   LDLDIRECT 63 11/13/2020   TRIG 141.0 05/15/2021   CHOLHDL 3 05/15/2021   A/P: Well-controlled in May-we will recheck lipid panel at physical  #BPH S: Medication: Tamsulosin 0.4 mg daily Symptoms slightly better lately- sleeping better - up 3x a night  A/P: slightly improved control- continue current meds  - also had slight PSa bump at cpe but #s came back down- wants to make sure not trending up- update psa today Lab Results  Component Value Date   PSA 1.38 08/19/2021   PSA 1.58 05/15/2021   PSA 0.92 05/14/2020    #Depression-patient denies recurrence of symptoms on Paxil 20 mg.  No SI. Exercise also helps   #Anemia/low ferritin in the past-patient is on iron.  Up-to-date on colonoscopy.  Ferritin normal last check. Last cbc normal Lab Results  Component Value Date   WBC 5.8 05/15/2021   HGB 14.9 05/15/2021   HCT 42.7 05/15/2021   MCV 88.5 05/15/2021   PLT 176.0 05/15/2021   #Health maintenance-discussed possible Prevnar 20 with Pneumovax 23 in 2019-patient prefers to wait till age 9.  Also discussed Shingrix- wants to hold off for now  Recommended follow up: Return in about 6 months (around 05/18/2022) for follow-up or sooner if needed. Future Appointments  Date Time Provider [place]  05/16/2022  8:00 AM Marin Olp, MD [facility]    Lab/Order  associations:   ICD-10-CM   1. Type II diabetes mellitus, well controlled ([place])  E11.9 CBC with Differential/Platelet    Comprehensive metabolic panel    Hemoglobin A1c    2. Hyperlipidemia, unspecified hyperlipidemia type  E78.5     3. Subclavian artery stenosis, right ([facility])  I77.1     4. Need for immunization against influenza  Z23     5. History of stroke  Z86.73     6. BPH associated with nocturia  N40.1 PSA   R35.1      Meds ordered this encounter  Medications   Miconazole Nitrate 2 % OINT    Sig: two times per day for  one to three weeks and repeated as necessary for angular cheilitis    Dispense:  141 g    Refill:  1   clopidogrel (PLAVIX) 75 MG tablet    Sig: TAKE 1 TABLET(75 MG) BY MOUTH DAILY    Dispense:  90 tablet    Refill:  3   blood glucose meter kit and supplies KIT    Sig: Dispense based on patient and insurance preference. Use up to four times daily as directed.    Dispense:  1 each    Refill:  0    Order Specific Question:   Number of strips    Answer:   100    Order Specific Question:   Number of lancets    Answer:   100    I,Jada Bradford,acting as a scribe for Garret Reddish, MD.,have documented all relevant documentation on the behalf of Garret Reddish, MD,as directed by  Garret Reddish, MD while in the presence of Garret Reddish, MD.  I, Garret Reddish, MD, have reviewed all documentation for this visit. The documentation on 11/18/21 for the exam, diagnosis, procedures, and orders are all accurate and complete.  Return precautions advised.  Garret Reddish, MD

## 2021-11-18 ENCOUNTER — Ambulatory Visit (INDEPENDENT_AMBULATORY_CARE_PROVIDER_SITE_OTHER): Payer: Commercial Managed Care - PPO | Admitting: Family Medicine

## 2021-11-18 ENCOUNTER — Other Ambulatory Visit: Payer: Self-pay

## 2021-11-18 ENCOUNTER — Encounter: Payer: Self-pay | Admitting: Family Medicine

## 2021-11-18 VITALS — BP 128/70 | HR 54 | Temp 97.5°F | Ht 71.0 in | Wt 196.2 lb

## 2021-11-18 DIAGNOSIS — R351 Nocturia: Secondary | ICD-10-CM

## 2021-11-18 DIAGNOSIS — E785 Hyperlipidemia, unspecified: Secondary | ICD-10-CM | POA: Diagnosis not present

## 2021-11-18 DIAGNOSIS — Z8673 Personal history of transient ischemic attack (TIA), and cerebral infarction without residual deficits: Secondary | ICD-10-CM

## 2021-11-18 DIAGNOSIS — N401 Enlarged prostate with lower urinary tract symptoms: Secondary | ICD-10-CM

## 2021-11-18 DIAGNOSIS — I771 Stricture of artery: Secondary | ICD-10-CM

## 2021-11-18 DIAGNOSIS — E119 Type 2 diabetes mellitus without complications: Secondary | ICD-10-CM | POA: Diagnosis not present

## 2021-11-18 DIAGNOSIS — Z23 Encounter for immunization: Secondary | ICD-10-CM

## 2021-11-18 LAB — CBC WITH DIFFERENTIAL/PLATELET
Basophils Absolute: 0 10*3/uL (ref 0.0–0.1)
Basophils Relative: 0.5 % (ref 0.0–3.0)
Eosinophils Absolute: 0.1 10*3/uL (ref 0.0–0.7)
Eosinophils Relative: 1.9 % (ref 0.0–5.0)
HCT: 41.4 % (ref 39.0–52.0)
Hemoglobin: 14 g/dL (ref 13.0–17.0)
Lymphocytes Relative: 29.7 % (ref 12.0–46.0)
Lymphs Abs: 1.7 10*3/uL (ref 0.7–4.0)
MCHC: 33.8 g/dL (ref 30.0–36.0)
MCV: 89.4 fl (ref 78.0–100.0)
Monocytes Absolute: 0.6 10*3/uL (ref 0.1–1.0)
Monocytes Relative: 10.8 % (ref 3.0–12.0)
Neutro Abs: 3.2 10*3/uL (ref 1.4–7.7)
Neutrophils Relative %: 57.1 % (ref 43.0–77.0)
Platelets: 179 10*3/uL (ref 150.0–400.0)
RBC: 4.63 Mil/uL (ref 4.22–5.81)
RDW: 13.1 % (ref 11.5–15.5)
WBC: 5.7 10*3/uL (ref 4.0–10.5)

## 2021-11-18 LAB — COMPREHENSIVE METABOLIC PANEL
ALT: 53 U/L (ref 0–53)
AST: 34 U/L (ref 0–37)
Albumin: 4.5 g/dL (ref 3.5–5.2)
Alkaline Phosphatase: 46 U/L (ref 39–117)
BUN: 20 mg/dL (ref 6–23)
CO2: 30 mEq/L (ref 19–32)
Calcium: 9.1 mg/dL (ref 8.4–10.5)
Chloride: 101 mEq/L (ref 96–112)
Creatinine, Ser: 0.97 mg/dL (ref 0.40–1.50)
GFR: 82.77 mL/min (ref >60.00)
Glucose, Bld: 104 mg/dL — ABNORMAL HIGH (ref 70–99)
Potassium: 4.4 mEq/L (ref 3.5–5.1)
Sodium: 137 mEq/L (ref 135–145)
Total Bilirubin: 0.6 mg/dL (ref 0.2–1.2)
Total Protein: 6.5 g/dL (ref 6.0–8.3)

## 2021-11-18 LAB — PSA: PSA: 1.23 ng/mL (ref 0.10–4.00)

## 2021-11-18 LAB — HEMOGLOBIN A1C: Hgb A1c MFr Bld: 6.3 % (ref 4.6–6.5)

## 2021-11-18 MED ORDER — MICONAZOLE NITRATE 2 % EX OINT: TOPICAL_OINTMENT | CUTANEOUS | 1 refills | Status: AC

## 2021-11-18 MED ORDER — CLOPIDOGREL BISULFATE 75 MG PO TABS: ORAL_TABLET | ORAL | 3 refills | Status: DC

## 2021-11-18 MED ORDER — BLOOD GLUCOSE MONITOR KIT: PACK | 0 refills | Status: AC

## 2021-11-18 NOTE — Patient Instructions (Addendum)
Health Maintenance Due  Topic Date Due   Zoster Vaccines- Shingrix (1 of 2)-   -later date  Never done   Pneumococcal Vaccine 14-64 Years old (2 - PCV)-   -We will wait until you're 65.  11/16/2019   Please trial Miconazole ointment twice a daily for 1-2 weeks for cracks on lips . Please update [place] on for any new, worsening or persistent symptoms.   Please stop by lab before you go If you have mychart- we will send your results within 3 business days of [place] receiving them.  If you do not have mychart- we will call you about results within 5 business days of [place] receiving them.  *please also note that you will see labs on mychart as soon as they post. I will later go in and write notes on them- will say "notes from Dr. Yong Channel"  Recommended follow up: Return in about 6 months (around 05/18/2022) for physical or sooner if needed.  Have a Happy Thanksgiving!

## 2022-05-16 ENCOUNTER — Ambulatory Visit (INDEPENDENT_AMBULATORY_CARE_PROVIDER_SITE_OTHER): Payer: Commercial Managed Care - PPO | Admitting: Family Medicine

## 2022-05-16 ENCOUNTER — Encounter: Payer: Self-pay | Admitting: Family Medicine

## 2022-05-16 VITALS — BP 126/72 | HR 45 | Temp 97.2°F | Ht 71.0 in | Wt 199.0 lb

## 2022-05-16 DIAGNOSIS — Z125 Encounter for screening for malignant neoplasm of prostate: Secondary | ICD-10-CM

## 2022-05-16 DIAGNOSIS — N401 Enlarged prostate with lower urinary tract symptoms: Secondary | ICD-10-CM | POA: Diagnosis not present

## 2022-05-16 DIAGNOSIS — E785 Hyperlipidemia, unspecified: Secondary | ICD-10-CM | POA: Diagnosis not present

## 2022-05-16 DIAGNOSIS — R351 Nocturia: Secondary | ICD-10-CM

## 2022-05-16 DIAGNOSIS — I771 Stricture of artery: Secondary | ICD-10-CM

## 2022-05-16 DIAGNOSIS — E119 Type 2 diabetes mellitus without complications: Secondary | ICD-10-CM

## 2022-05-16 DIAGNOSIS — Z0000 Encounter for general adult medical examination without abnormal findings: Secondary | ICD-10-CM

## 2022-05-16 DIAGNOSIS — F3342 Major depressive disorder, recurrent, in full remission: Secondary | ICD-10-CM

## 2022-05-16 LAB — COMPREHENSIVE METABOLIC PANEL
ALT: 52 U/L (ref 0–53)
AST: 29 U/L (ref 0–37)
Albumin: 4.5 g/dL (ref 3.5–5.2)
Alkaline Phosphatase: 52 U/L (ref 39–117)
BUN: 18 mg/dL (ref 6–23)
CO2: 30 mEq/L (ref 19–32)
Calcium: 9 mg/dL (ref 8.4–10.5)
Chloride: 98 mEq/L (ref 96–112)
Creatinine, Ser: 0.99 mg/dL (ref 0.40–1.50)
GFR: 80.49 mL/min (ref >60.00)
Glucose, Bld: 100 mg/dL — ABNORMAL HIGH (ref 70–99)
Potassium: 4.2 mEq/L (ref 3.5–5.1)
Sodium: 134 mEq/L — ABNORMAL LOW (ref 135–145)
Total Bilirubin: 0.5 mg/dL (ref 0.2–1.2)
Total Protein: 6.7 g/dL (ref 6.0–8.3)

## 2022-05-16 LAB — LIPID PANEL
Cholesterol: 111 mg/dL (ref 0–200)
HDL: 30.8 mg/dL — ABNORMAL LOW (ref >39.00)
LDL Cholesterol: 43 mg/dL (ref 0–99)
NonHDL: 80.16
Total CHOL/HDL Ratio: 4
Triglycerides: 188 mg/dL — ABNORMAL HIGH (ref 0.0–149.0)
VLDL: 37.6 mg/dL (ref 0.0–40.0)

## 2022-05-16 LAB — CBC WITH DIFFERENTIAL/PLATELET
Basophils Absolute: 0 10*3/uL (ref 0.0–0.1)
Basophils Relative: 0.4 % (ref 0.0–3.0)
Eosinophils Absolute: 0.1 10*3/uL (ref 0.0–0.7)
Eosinophils Relative: 2 % (ref 0.0–5.0)
HCT: 42.3 % (ref 39.0–52.0)
Hemoglobin: 14.3 g/dL (ref 13.0–17.0)
Lymphocytes Relative: 28.2 % (ref 12.0–46.0)
Lymphs Abs: 1.5 10*3/uL (ref 0.7–4.0)
MCHC: 33.9 g/dL (ref 30.0–36.0)
MCV: 89.7 fl (ref 78.0–100.0)
Monocytes Absolute: 0.5 10*3/uL (ref 0.1–1.0)
Monocytes Relative: 10 % (ref 3.0–12.0)
Neutro Abs: 3.3 10*3/uL (ref 1.4–7.7)
Neutrophils Relative %: 59.4 % (ref 43.0–77.0)
Platelets: 178 10*3/uL (ref 150.0–400.0)
RBC: 4.71 Mil/uL (ref 4.22–5.81)
RDW: 13.1 % (ref 11.5–15.5)
WBC: 5.5 10*3/uL (ref 4.0–10.5)

## 2022-05-16 LAB — MICROALBUMIN / CREATININE URINE RATIO
Creatinine,U: 50.7 mg/dL
Microalb Creat Ratio: 1.4 mg/g (ref 0.0–30.0)
Microalb, Ur: <0.7 mg/dL (ref 0.0–1.9)

## 2022-05-16 LAB — PSA: PSA: 1.41 ng/mL (ref 0.10–4.00)

## 2022-05-16 LAB — HEMOGLOBIN A1C: Hgb A1c MFr Bld: 6.3 % (ref 4.6–6.5)

## 2022-05-16 MED ORDER — CLOPIDOGREL BISULFATE 75 MG PO TABS: ORAL_TABLET | ORAL | 3 refills | Status: DC

## 2022-05-16 MED ORDER — METFORMIN HCL 500 MG PO TABS: ORAL_TABLET | ORAL | 3 refills | Status: DC

## 2022-05-16 MED ORDER — TAMSULOSIN HCL 0.4 MG PO CAPS: ORAL_CAPSULE | ORAL | 3 refills | Status: DC

## 2022-05-16 MED ORDER — PAROXETINE HCL 20 MG PO TABS: ORAL_TABLET | ORAL | 3 refills | Status: DC

## 2022-05-16 MED ORDER — ROSUVASTATIN CALCIUM 40 MG PO TABS: ORAL_TABLET | ORAL | 3 refills | Status: DC

## 2022-05-16 MED ORDER — FAMOTIDINE 20 MG PO TABS: ORAL_TABLET | ORAL | 3 refills | Status: DC

## 2022-05-16 NOTE — Patient Instructions (Addendum)
Please stop by lab before you go If you have mychart- we will send your results within 3 business days of [place] receiving them.  If you do not have mychart- we will call you about results within 5 business days of [place] receiving them.  *please also note that you will see labs on mychart as soon as they post. I will later go in and write notes on them- will say "notes from Dr. Yong Channel"  Recommended follow up: Return in about 6 months (around 11/16/2022) for followup or sooner if needed.Schedule b4 you leave.

## 2022-05-16 NOTE — Progress Notes (Signed)
Phone: [phone]   Subjective:  Patient presents today for their annual physical. Chief complaint-noted.   See problem oriented charting- ROS- full  review of systems was completed and negative  per full ROS sheet completed by patient  The following were reviewed and entered/updated in epic: Past Medical History:  Diagnosis Date   Allergy    Anxiety    Arthritis    Depression (disease)    Diabetes mellitus ([place])    Erosive esophagitis    GERD (gastroesophageal reflux disease)    Hemorrhoids    History of cerebrovascular accident    Hyperlipidemia    Peripheral vascular disease ([place])    Stroke ([place])    2001   Subclavian artery stenosis, right (HCC)    Tubular adenoma polyp of rectum 07/2010   Patient Active Problem List   Diagnosis Date Noted   Subclavian artery stenosis, right ([place]) 05/04/2018    Priority: High   Type II diabetes mellitus, well controlled ([place]) 12/11/2014    Priority: High   History of CVA (cerebrovascular accident) 07/26/2007    Priority: High   BPH associated with nocturia 04/24/2016    Priority: Medium    Depression 08/21/2008    Priority: Medium    Hyperlipidemia 07/26/2007    Priority: Medium    Chondromalacia 11/05/2017    Priority: Low   History of adenomatous polyp of colon 04/24/2016    Priority: Low   GERD 07/26/2007    Priority: Low   Recurrent major depressive disorder, in full remission ([place]) 05/16/2022   Visual disturbance 01/07/2018   Past Surgical History:  Procedure Laterality Date   colon polyp removal  1967   COLONOSCOPY      Family History  Problem Relation Age of Onset   Parkinson's disease Mother    Coronary artery disease Father    Heart attack Father 34       infection related potentially   Heart disease Father    Heart disease Maternal Grandfather        unknown age, MI at some age   Heart attack Maternal Grandfather    Diabetes Maternal Grandmother    Colon cancer Neg Hx    Esophageal cancer Neg Hx     Stomach cancer Neg Hx    Rectal cancer Neg Hx     Medications- reviewed and updated Current Outpatient Medications  Medication Sig Dispense Refill   aspirin 325 MG EC tablet Take 325 mg by mouth daily.     blood glucose meter kit and supplies KIT Dispense based on patient and insurance preference. Use up to four times daily as directed. 1 each 0   clopidogrel (PLAVIX) 75 MG tablet TAKE 1 TABLET(75 MG) BY MOUTH DAILY 90 tablet 3   famotidine (PEPCID) 20 MG tablet TAKE 1 TABLET BY MOUTH TWICE DAILY TO BE TAKEN WITH NSAID 180 tablet 3   ferrous sulfate 325 (65 FE) MG EC tablet Take 325 mg by mouth daily with breakfast.     fish oil-omega-3 fatty acids 1000 MG capsule Take 3 g by mouth daily.     metFORMIN (GLUCOPHAGE) 500 MG tablet TAKE 1 TABLET(500 MG) BY MOUTH DAILY WITH BREAKFAST 90 tablet 3   Miconazole Nitrate 2 % OINT two times per day for one to three weeks and repeated as necessary for angular cheilitis 141 g 1   niacin 500 MG tablet Take 500 mg by mouth. 1 po at bedtime     PARoxetine (PAXIL) 20 MG tablet TAKE 1 TABLET(20  MG) BY MOUTH DAILY 90 tablet 3   rosuvastatin (CRESTOR) 40 MG tablet TAKE 1 TABLET(40 MG) BY MOUTH DAILY 90 tablet 3   tamsulosin (FLOMAX) 0.4 MG CAPS capsule TAKE 1 CAPSULE(0.4 MG) BY MOUTH DAILY 90 capsule 3   No current facility-administered medications for this visit.    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 Oropharynx normal Neck: no thyromegaly CV: RRR no murmurs rubs or gallops Lungs: CTAB no crackles, wheeze, rhonchi Abdomen: soft/nontender/nondistended/normal bowel sounds. No rebound or guarding.  Ext: no edema Skin: warm, dry Neuro: grossly normal, moves all extremities, PERRLA  Diabetic Foot Exam - Simple   Simple Foot Form Diabetic Foot exam was performed with the following findings: Yes 05/16/2022  8:13 AM  Visual Inspection No deformities, no ulcerations, no other skin breakdown bilaterally: Yes Sensation Testing Intact to touch and monofilament testing bilaterally: Yes Pulse Check Posterior Tibialis and Dorsalis pulse intact bilaterally: Yes Comments      Assessment and Plan  66 y.o. male presenting for annual physical.  Health Maintenance counseling: 1. Anticipatory guidance: Patient counseled regarding regular dental exams -q6 months, eye exams -yearly and on file,   avoiding smoking and second hand smoke , limiting alcohol to 2 beverages per day - very rare, no illcit drugs.   2. Risk factor reduction:  Advised patient of need for regular exercise and diet rich and fruits and vegetables to reduce risk of heart attack and stroke.  Exercise- varies depending on work- mostly 3 days a week for 30 minutes.  Diet/weight management-weight up 3 lbs from last visit and 14 lbs from last year, work stress was making it harder to eat well and rather sedentary he says- bought new scale- plans to restart calorie counting.  Wt Readings from Last 3 Encounters:  05/16/22 199 lb (90.3 kg)  11/18/21 196 lb 3.2 oz (89 kg)  05/15/21 185 lb 6.1 oz (84.1 kg)  3. Immunizations/screenings/ancillary studies- shingrix - plans to do in future, considering covid shot in fall  Immunization History  Administered Date(s) Administered    Influenza Whole 10/17/2009, 09/28/2012   Influenza,inj,Quad PF,6+ Mos 09/13/2019, 10/12/2021   Influenza-Unspecified 10/12/2014, 10/09/2016, 09/21/2017, 10/13/2018, 10/10/2020   PFIZER(Purple Top)SARS-COV-2 Vaccination 03/15/2020, 04/09/2020, 10/13/2020, 05/11/2021   Pfizer Covid-19 Vaccine Bivalent Booster 54yr & up 10/12/2021   Pneumococcal Polysaccharide-23 11/15/2018   Td 12/29/1997, 01/27/2008   Tdap 05/04/2018  4. Prostate cancer screening-  low risk psa trend- continue to monitor with labs today Lab Results  Component Value Date   PSA 1.23 11/18/2021   PSA 1.38 08/19/2021   PSA 1.58 05/15/2021  5. Colon cancer screening - November 2017 with 7 year follow up with adenomatous colon polyp history- Dr. Fuller Plan 6. Skin cancer screening-  waist up exam today without worrisome features. advised regular sunscreen use. Denies worrisome, changing, or new skin lesions.  7. Smoking associated screening (lung cancer screening, AAA screen 65-75, UA)- never smoker- no screening needed 8. STD screening -  only active with wife  Status of chronic or acute concerns   #History of stroke-patient remains on Plavix 75 mg ans aspirin 325 mg per Dr. Scot Dock recommendations   #subclavian artery stenosis-remains on risk factor modification with statin, Plavix.  Originally presented with subclavian steal syndrome with vertigo and blurred vision.  Dr. Scot Dock in 2019 said prn follow up for concerns per patient.  Appears stable without worsening symptoms- continue current meds  # Diabetes S: Medication:Metformin 500 mg daily CBGs- doesn't check sugar regularly Lab Results  Component Value Date   HGBA1C 6.3 11/18/2021  A/P: hopefully stable- update a1c today. Continue current meds for now  #hyperlipidemia/LFT elevation.  LDL goal under 70 with history of stroke S: Medication:Rosuvastatin 40 mg, fish oil, niacin. Lab Results  Component Value Date   CHOL 114 05/15/2021   HDL 37.30 (L) 05/15/2021    LDLCALC 49 05/15/2021   LDLDIRECT 63 11/13/2020   TRIG 141.0 05/15/2021   CHOLHDL 3 05/15/2021   Lab Results  Component Value Date   ALT 53 11/18/2021   AST 34 11/18/2021   ALKPHOS 46 11/18/2021   BILITOT 0.6 11/18/2021  A/P: hopefully stable with LDL under 70- update lipid panel today. Continue current meds for now -Patient with mild AST elevation in past- normal last check- update today  # Depression S: Medication:Paxil 20 mg.      05/16/2022    8:03 AM 11/18/2021    8:05 AM 05/15/2021    8:13 AM  Depression screen PHQ 2/9  Decreased Interest 0 0 1  Down, Depressed, Hopeless 0 0 1  PHQ - 2 Score 0 0 2  Altered sleeping 0 0 1  Tired, decreased energy 0 0 1  Change in appetite 0 0 0  Feeling bad or failure about yourself  0 0 1  Trouble concentrating 0 0 1  Moving slowly or fidgety/restless 0 0 0  Suicidal thoughts 0 0 0  PHQ-9 Score 0 0 6  Difficult doing work/chores Not difficult at all Not difficult at all Somewhat difficult  A/P:  full remisison- continue current meds   #BPH S:Nocturia up to 3 times a night even  on Flomax last visit.   - in past We added low-dose finasteride 2.5 mg but did not help over 5 months and decreased libido A/P: overall stable- continue current meds   #Anemia/low ferritin in the past-patient is on iron most of the time.  Up-to-date on colonoscopy.  Ferritin normal last check. monitor CBC alone  Tympanostomy tube 2017 in left ear- replaced in 2020 and 01/14/22 with Dr. Redmond Baseman  Recommended follow up: Return in about 6 months (around 11/16/2022) for followup or sooner if needed.Schedule b4 you leave.  Lab/Order associations: fasting   ICD-10-CM   1. Preventative health care  Z00.00 CBC with Differential/Platelet    Comprehensive metabolic panel    Lipid panel    Hemoglobin A1c    PSA    2. Type II diabetes mellitus, well controlled ([place])  E11.9 CBC with Differential/Platelet    Comprehensive metabolic panel    Lipid panel     Hemoglobin A1c  Microalbumin / creatinine urine ratio    3. Hyperlipidemia, unspecified hyperlipidemia type  E78.5     4. Subclavian artery stenosis, right (HCC)  I77.1     5. BPH associated with nocturia  N40.1 PSA   R35.1     6. Screening for prostate cancer  Z12.5 PSA    7. Recurrent major depressive disorder, in full remission ([place])  F33.42       No orders of the defined types were placed in this encounter.   Return precautions advised.  Garret Reddish, MD

## 2022-06-04 ENCOUNTER — Other Ambulatory Visit: Payer: Self-pay | Admitting: Family Medicine

## 2022-07-18 ENCOUNTER — Other Ambulatory Visit: Payer: Self-pay | Admitting: Family Medicine

## 2022-09-22 ENCOUNTER — Encounter: Payer: Self-pay | Admitting: *Deleted

## 2022-11-11 ENCOUNTER — Encounter: Payer: Self-pay | Admitting: Family Medicine

## 2022-11-11 ENCOUNTER — Ambulatory Visit (INDEPENDENT_AMBULATORY_CARE_PROVIDER_SITE_OTHER): Payer: Medicare Other | Admitting: Family Medicine

## 2022-11-11 VITALS — BP 102/64 | HR 47 | Temp 97.9°F | Wt 194.4 lb

## 2022-11-11 DIAGNOSIS — E785 Hyperlipidemia, unspecified: Secondary | ICD-10-CM

## 2022-11-11 DIAGNOSIS — F3342 Major depressive disorder, recurrent, in full remission: Secondary | ICD-10-CM

## 2022-11-11 DIAGNOSIS — E119 Type 2 diabetes mellitus without complications: Secondary | ICD-10-CM | POA: Diagnosis not present

## 2022-11-11 DIAGNOSIS — I771 Stricture of artery: Secondary | ICD-10-CM | POA: Diagnosis not present

## 2022-11-11 LAB — COMPREHENSIVE METABOLIC PANEL
ALT: 39 U/L (ref 0–53)
AST: 27 U/L (ref 0–37)
Albumin: 4.5 g/dL (ref 3.5–5.2)
Alkaline Phosphatase: 52 U/L (ref 39–117)
BUN: 17 mg/dL (ref 6–23)
CO2: 32 mEq/L (ref 19–32)
Calcium: 9.1 mg/dL (ref 8.4–10.5)
Chloride: 101 mEq/L (ref 96–112)
Creatinine, Ser: 1.05 mg/dL (ref 0.40–1.50)
GFR: 74.75 mL/min (ref >60.00)
Glucose, Bld: 108 mg/dL — ABNORMAL HIGH (ref 70–99)
Potassium: 4.7 mEq/L (ref 3.5–5.1)
Sodium: 136 mEq/L (ref 135–145)
Total Bilirubin: 0.6 mg/dL (ref 0.2–1.2)
Total Protein: 6.8 g/dL (ref 6.0–8.3)

## 2022-11-11 LAB — HEMOGLOBIN A1C: Hgb A1c MFr Bld: 6.3 % (ref 4.6–6.5)

## 2022-11-11 MED ORDER — PAROXETINE HCL 10 MG PO TABS: 10.0000 mg | ORAL_TABLET | Freq: Every day | ORAL | 3 refills | Status: DC

## 2022-11-11 NOTE — Patient Instructions (Addendum)
Let us know when you get your [facility] vaccine.  Get Diabetic Eye Exam Scheduled. Sign release of information at the check out desk for last diabetic eye exam for one from January 2023  has been doing well plus stress likely lower with retirement- we opted to trial paxil 10 mg and can see how he is doing at next visit- if worsening symptoms can go back up to 20 mg and let me know   Health Maintenance Due  Topic Date Due   Medicare Annual Wellness (AWV) - please do not do this with nurse- lets have our 6 month welcome to medicare first Never done   Zoster Vaccines- Shingrix (1 of 2)- Please check with your pharmacy to see if they have the shingrix vaccine. If they do- please get this immunization and update [place] by phone call or mychart with dates you receive the vaccine  Never done   Pneumonia Vaccine 64+ Years old (2 - PCV)- consider prevnar next visit 11/04/2022   Recommended follow up: Return in about 6 months (around 05/12/2023) for welcome to medicare.

## 2022-11-11 NOTE — Progress Notes (Signed)
Phone [phone] In person visit   Subjective:   Darrell Gaines is a 66 y.o. year old very pleasant male patient who presents for/with See problem oriented charting Chief Complaint  Patient presents with   Follow-up   Hyperlipidemia   Past Medical History-  Patient Active Problem List   Diagnosis Date Noted   Subclavian artery stenosis, right ([place]) 05/04/2018    Priority: High   Type II diabetes mellitus, well controlled ([place]) 12/11/2014    Priority: High   History of CVA (cerebrovascular accident) 07/26/2007    Priority: High   BPH associated with nocturia 04/24/2016    Priority: Medium    Depression 08/21/2008    Priority: Medium    Hyperlipidemia 07/26/2007    Priority: Medium    Chondromalacia 11/05/2017    Priority: Low   History of adenomatous polyp of colon 04/24/2016    Priority: Low   GERD 07/26/2007    Priority: Low   Recurrent major depressive disorder, in full remission ([place]) 05/16/2022   Visual disturbance 01/07/2018    Medications- reviewed and updated Current Outpatient Medications  Medication Sig Dispense Refill   aspirin 325 MG EC tablet Take 325 mg by mouth daily.     blood glucose meter kit and supplies KIT Dispense based on patient and insurance preference. Use up to four times daily as directed. 1 each 0   clopidogrel (PLAVIX) 75 MG tablet TAKE 1 TABLET(75 MG) BY MOUTH DAILY 90 tablet 3   famotidine (PEPCID) 20 MG tablet TAKE 1 TABLET BY MOUTH TWICE DAILY TO BE TAKEN WITH NSAID 180 tablet 3   ferrous sulfate 325 (65 FE) MG EC tablet Take 325 mg by mouth daily with breakfast.     fish oil-omega-3 fatty acids 1000 MG capsule Take 3 g by mouth daily.     metFORMIN (GLUCOPHAGE) 500 MG tablet TAKE 1 TABLET(500 MG) BY MOUTH DAILY WITH BREAKFAST 90 tablet 3   Miconazole Nitrate 2 % OINT two times per day for one to three weeks and repeated as necessary for angular cheilitis 141 g 1   niacin 500 MG tablet Take 500 mg by mouth. 1 po at bedtime      PARoxetine (PAXIL) 10 MG tablet Take 1 tablet (10 mg total) by mouth daily. 90 tablet 3   rosuvastatin (CRESTOR) 40 MG tablet TAKE 1 TABLET(40 MG) BY MOUTH DAILY 90 tablet 3   tamsulosin (FLOMAX) 0.4 MG CAPS capsule TAKE 1 CAPSULE(0.4 MG) BY MOUTH DAILY 90 capsule 3   No current facility-administered medications for this visit.     Objective:  BP 102/64   Pulse (!) 47   Temp 97.9 F (36.6 C)   Wt 194 lb 6.4 oz (88.2 kg)   SpO2 98%   BMI 27.11 kg/m  Gen: NAD, resting comfortably CV: bradycardic but regular, no murmurs rubs or gallops Lungs: CTAB no crackles, wheeze, rhonchi Abdomen: soft/nontender/nondistended/normal bowel sounds.  Ext: no edema Skin: warm, dry     Assessment and Plan   #social update- retired last Tuesday! Plans to start playing pickleball  #History of stroke-patient remains on Plavix 75 mg and aspirin 325 mg per Dr. Scot Dock recommendations   #subclavian artery stenosis-remains on risk factor modification with statin, Plavix, asa.  Originally presented with subclavian steal syndrome with vertigo and blurred vision. -No recurrence of symptoms/controlled/continue to monitor  -  Dr. Scot Dock in 2019 said prn follow up for concerns per patient.   # Diabetes S: Medication:Metformin 500 mg daily CBGs- rare  checks Exercise and diet- still walking 2.5 miles a day most days.  Trying to cook at home/reasonably healthy  Lab Results  Component Value Date   HGBA1C 6.3 05/16/2022   HGBA1C 6.3 11/18/2021   HGBA1C 6.4 05/15/2021  A/P: hopefully stable- update a1c today. Continue current meds for now   #hyperlipidemia/LFT elevation.  LDL goal under 70 with history of stroke S: Medication:Rosuvastatin 40 mg, fish oil, niacin. Lab Results  Component Value Date   CHOL 111 05/16/2022   HDL 30.80 (L) 05/16/2022   LDLCALC 43 05/16/2022   LDLDIRECT 63 11/13/2020   TRIG 188.0 (H) 05/16/2022   CHOLHDL 4 05/16/2022    Lab Results  Component Value Date   ALT 52  05/16/2022   AST 29 05/16/2022   ALKPHOS 52 05/16/2022   BILITOT 0.5 05/16/2022  A/P: hopefully stable- update lipid panel next visit. Continue current meds for now -Patient with mild AST elevation in pastwhich we will update with labs today  # Depression S: Medication: Paxil 20 mg.      11/11/2022    8:24 AM 05/16/2022    8:03 AM 11/18/2021    8:05 AM  Depression screen PHQ 2/9  Decreased Interest 0 0 0  Down, Depressed, Hopeless 0 0 0  PHQ - 2 Score 0 0 0  Altered sleeping 0 0 0  Tired, decreased energy 0 0 0  Change in appetite 0 0 0  Feeling bad or failure about yourself  0 0 0  Trouble concentrating 0 0 0  Moving slowly or fidgety/restless 0 0 0  Suicidal thoughts 0 0 0  PHQ-9 Score 0 0 0  Difficult doing work/chores Not difficult at all Not difficult at all Not difficult at all  A/P: full remission. has been doing well plus stress likely lower with retirement- we opted to trial paxil 10 mg and can see how he is doing at next visit- if worsening symptoms can go back up to 20 mg and let me know   #BPH S:Nocturia up to 3-5 times a night even  on Flomax - has been able to sleep in with retirement which helps with rest - in past We added low-dose finasteride 2.5 mg but did not help over 5 months and decreased libido A/P: overall stable- continue current meds   Tympanostomy tube 2017 in left ear- replaced in 2020 and 01/14/22 with Dr. Redmond Baseman  Recommended follow up: Return in about 6 months (around 05/12/2023) for welcome to medicare.  Lab/Order associations:   ICD-10-CM   1. Type II diabetes mellitus, well controlled ([place])  E11.9 Comprehensive metabolic panel    Hemoglobin A1c    2. Subclavian artery stenosis, right (HCC)  I77.1     3. Hyperlipidemia, unspecified hyperlipidemia type  E78.5     4. Recurrent major depressive disorder, in full remission ([place])  F33.42       Meds ordered this encounter  Medications   PARoxetine (PAXIL) 10 MG tablet    Sig: Take 1 tablet  (10 mg total) by mouth daily.    Dispense:  90 tablet    Refill:  3    Return precautions advised.  Garret Reddish, MD

## 2023-01-12 DIAGNOSIS — K080 Exfoliation of teeth due to systemic causes: Secondary | ICD-10-CM | POA: Diagnosis not present

## 2023-01-14 ENCOUNTER — Encounter: Payer: Self-pay | Admitting: Gastroenterology

## 2023-01-26 ENCOUNTER — Encounter: Payer: Self-pay | Admitting: Physician Assistant

## 2023-02-16 NOTE — Progress Notes (Unsigned)
02/17/2023 Darrell Gaines [id number] 12/07/1956  Referring provider: Marin Olp, MD Primary GI doctor: Dr. Fuller Plan  ASSESSMENT AND PLAN:   History of adenomatous polyp of colon We have discussed the risks of bleeding, infection, perforation, medication reactions, and remote risk of death associated with colonoscopy. All questions were answered and the patient acknowledges these risk and wishes to proceed.  History of CVA (cerebrovascular accident) Patient told to hold his Plavix for 5 days prior to time of procedure.  Will instruct when and how to resume after procedure. We will communicate with his prescribing physician Dr. Yong Channel to ensure that holding his Plavix is acceptable. We discussed the risk, benefits and alternatives to colonoscopy/endoscopy.  We also discussed the low but real risk of cardiovascular event such as heart attack, stroke, embolism, thrombosis or ischemia/infarct of other organs off Plavix and explained the need to seek urgent help if this occurs.  He is agreeable and wishes to proceed.  Type II diabetes mellitus, well controlled ([place]) On metformin  Gastroesophageal reflux disease with esophagitis, unspecified whether hemorrhage Lifestyle changes discussed, avoid NSAIDS, ETOH    Patient Care Team: Marin Olp, MD as PCP - General (Family Medicine)  HISTORY OF PRESENT ILLNESS: 67 y.o. male referred by Marin Olp, MD presents for consultation of colonoscopy while on blood thinner.  He has past medical history significant for GERD, adenomatous polyps and others listed below, Patient is on Plavix for small CVA 2001, Dr. Yong Channel PCP.  Had another small CVA in 2021 while on plavix, and now also on 325 mg ASA.  He is diabetic on metformin.   12/2015 colonoscopy small internal hemorrhoids recall 12/2020 due to personal history of adenomatous polyps Patient denies  family history of colon cancer or other gastrointestinal  malignancies. Patient has a BM every daily.  Patient denies change in bowel habits, constipation, diarrhea, hematochezia.  Denies changes in appetite, unintentional weight loss.  Patient does not complain of GERD.  Patient denies dysphagia, nausea, vomiting, melena.   CBC on 05/16/2022  WBC 5.5 HGB 14.3 MCV 89.7 Platelets 178.0  He  reports that he has never smoked. He has never used smokeless tobacco. He reports current alcohol use. He reports that he does not use drugs.  RELEVANT LABS AND IMAGING: CBC    Component Value Date/Time   WBC 5.5 05/16/2022 0835   RBC 4.71 05/16/2022 0835   HGB 14.3 05/16/2022 0835   HCT 42.3 05/16/2022 0835   PLT 178.0 05/16/2022 0835   MCV 89.7 05/16/2022 0835   MCH 30.9 11/13/2020 0827   MCHC 33.9 05/16/2022 0835   RDW 13.1 05/16/2022 0835   LYMPHSABS 1.5 05/16/2022 0835   MONOABS 0.5 05/16/2022 0835   EOSABS 0.1 05/16/2022 0835   BASOSABS 0.0 05/16/2022 0835   Recent Labs    05/16/22 0835  HGB 14.3     CMP     Component Value Date/Time   NA 136 11/11/2022 0858   K 4.7 11/11/2022 0858   CL 101 11/11/2022 0858   CO2 32 11/11/2022 0858   GLUCOSE 108 (H) 11/11/2022 0858   GLUCOSE 107 (H) 01/05/2007 0844   BUN 17 11/11/2022 0858   CREATININE 1.05 11/11/2022 0858   CREATININE 1.11 11/13/2020 0827   CALCIUM 9.1 11/11/2022 0858   PROT 6.8 11/11/2022 0858   ALBUMIN 4.5 11/11/2022 0858   AST 27 11/11/2022 0858   ALT 39 11/11/2022 0858   ALKPHOS 52 11/11/2022 0858   BILITOT 0.6 11/11/2022  0858   GFRNONAA 70 11/13/2020 0827   GFRAA 81 11/13/2020 0827      Latest Ref Rng & Units 11/11/2022    8:58 AM 05/16/2022    8:35 AM 11/18/2021    8:33 AM  Hepatic Function  Total Protein 6.0 - 8.3 g/dL 6.8  6.7  6.5   Albumin 3.5 - 5.2 g/dL 4.5  4.5  4.5   AST 0 - 37 U/L 27  29  34   ALT 0 - 53 U/L 39  52  53   Alk Phosphatase 39 - 117 U/L 52  52  46   Total Bilirubin 0.2 - 1.2 mg/dL 0.6  0.5  0.6       Current Medications:   Current  Outpatient Medications (Endocrine & Metabolic):    metFORMIN (GLUCOPHAGE) 500 MG tablet, TAKE 1 TABLET(500 MG) BY MOUTH DAILY WITH BREAKFAST  Current Outpatient Medications (Cardiovascular):    rosuvastatin (CRESTOR) 40 MG tablet, TAKE 1 TABLET(40 MG) BY MOUTH DAILY   Current Outpatient Medications (Analgesics):    aspirin 325 MG EC tablet, Take 325 mg by mouth daily.  Current Outpatient Medications (Hematological):    clopidogrel (PLAVIX) 75 MG tablet, TAKE 1 TABLET(75 MG) BY MOUTH DAILY   ferrous sulfate 325 (65 FE) MG EC tablet, Take 325 mg by mouth daily with breakfast.  Current Outpatient Medications (Other):    blood glucose meter kit and supplies KIT, Dispense based on patient and insurance preference. Use up to four times daily as directed.   famotidine (PEPCID) 20 MG tablet, TAKE 1 TABLET BY MOUTH TWICE DAILY TO BE TAKEN WITH NSAID   fish oil-omega-3 fatty acids 1000 MG capsule, Take 3 g by mouth daily.   niacin 500 MG tablet, Take 500 mg by mouth. 1 po at bedtime   PARoxetine (PAXIL) 10 MG tablet, Take 1 tablet (10 mg total) by mouth daily.   tamsulosin (FLOMAX) 0.4 MG CAPS capsule, TAKE 1 CAPSULE(0.4 MG) BY MOUTH DAILY   Miconazole Nitrate 2 % OINT, two times per day for one to three weeks and repeated as necessary for angular cheilitis (Patient not taking: Reported on 02/17/2023)  Medical History:  Past Medical History:  Diagnosis Date   Allergy    Anxiety    Arthritis    Depression (disease)    Diabetes mellitus ([place])    Erosive esophagitis    GERD (gastroesophageal reflux disease)    Hemorrhoids    History of cerebrovascular accident    Hyperlipidemia    Peripheral vascular disease ([place])    Stroke ([place])    2001   Subclavian artery stenosis, right ([facility])    Tubular adenoma polyp of rectum 07/2010   Allergies: No Known Allergies   Surgical History:  He  has a past surgical history that includes colon polyp removal (1967) and Colonoscopy. Family History:  His  family history includes Coronary artery disease in his father; Diabetes in his maternal grandmother; Heart attack in his maternal grandfather; Heart attack (age of onset: 19) in his father; Heart disease in his father and maternal grandfather; Parkinson's disease in his mother.  REVIEW OF SYSTEMS  : All other systems reviewed and negative except where noted in the History of Present Illness.  PHYSICAL EXAM: BP 116/70 (BP Location: Left Arm, Patient Position: Sitting, Cuff Size: Normal)   Pulse (!) 56   Ht 5' 10"$  (1.778 m)   Wt 195 lb (88.5 kg)   BMI 27.98 kg/m  General Appearance: Well nourished, in no  apparent distress. Head:   Normocephalic and atraumatic. Eyes:  sclerae anicteric,conjunctive pink  Respiratory: Respiratory effort normal, BS equal bilaterally without rales, rhonchi, wheezing. Cardio: RRR with no MRGs. Peripheral pulses intact.  Abdomen: Soft,  Non-distended ,active bowel sounds. No tenderness . No masses. Rectal: Not evaluated Musculoskeletal: Full ROM, Normal gait. Without edema. Skin:  Dry and intact without significant lesions or rashes Neuro: Alert and  oriented x4;  No focal deficits. Psych:  Cooperative. Normal mood and affect.    Vladimir Crofts, PA-C 12:01 PM

## 2023-02-17 ENCOUNTER — Telehealth: Payer: Self-pay

## 2023-02-17 ENCOUNTER — Ambulatory Visit: Payer: Medicare Other | Admitting: Physician Assistant

## 2023-02-17 ENCOUNTER — Encounter: Payer: Self-pay | Admitting: Physician Assistant

## 2023-02-17 VITALS — BP 116/70 | HR 56 | Ht 70.0 in | Wt 195.0 lb

## 2023-02-17 DIAGNOSIS — E119 Type 2 diabetes mellitus without complications: Secondary | ICD-10-CM | POA: Diagnosis not present

## 2023-02-17 DIAGNOSIS — Z8673 Personal history of transient ischemic attack (TIA), and cerebral infarction without residual deficits: Secondary | ICD-10-CM | POA: Diagnosis not present

## 2023-02-17 DIAGNOSIS — K2100 Gastro-esophageal reflux disease with esophagitis, without bleeding: Secondary | ICD-10-CM | POA: Diagnosis not present

## 2023-02-17 DIAGNOSIS — Z86010 Personal history of colonic polyps: Secondary | ICD-10-CM

## 2023-02-17 MED ORDER — NA SULFATE-K SULFATE-MG SULF 17.5-3.13-1.6 GM/177ML PO SOLN: ORAL | 0 refills | Status: DC

## 2023-02-17 NOTE — Telephone Encounter (Signed)
Sorry meant to send to [place]

## 2023-02-17 NOTE — Telephone Encounter (Signed)
   Darrell Gaines 1956-07-26 [id number]  Dear Dr Yong Channel  We have scheduled the above named patient for a(n) Colonoscopy procedure. Our records show that (s)he is on anticoagulation therapy.  Please advise as to whether the patient may come off their therapy of Plavix 5 days hold prior to their procedure which is scheduled for 03/10/23.  Please route your response to [employer] or fax response to [phone].  Sincerely,    [facility]

## 2023-02-17 NOTE — Telephone Encounter (Signed)
Yes thanks- plavix may be held for 5 days- please let him know it does slightly increase stroke risk and if any signs/symptoms of stroke to seek care but I still think benefits outweigh risks and he can proceed forward

## 2023-02-17 NOTE — Patient Instructions (Addendum)
You have been scheduled for a colonoscopy. Please follow written instructions given to you at your visit today.  Please pick up your prep supplies at the pharmacy within the next 1-3 days. If you use inhalers (even only as needed), please bring them with you on the day of your procedure.   You will be contacted by our office prior to your procedure for directions on holding your Plavix.  If you do not hear from our office 1 week prior to your scheduled procedure, please call [phone] to discuss.    If your blood pressure at your visit was 140/90 or greater, please contact your primary care physician to follow up on this.  _______________________________________________________  If you are age 57 or older, your body mass index should be between 23-30. Your Body mass index is 27.98 kg/m. If this is out of the aforementioned range listed, please consider follow up with your Primary Care Provider.  If you are age 97 or younger, your body mass index should be between 19-25. Your Body mass index is 27.98 kg/m. If this is out of the aformentioned range listed, please consider follow up with your Primary Care Provider.   ________________________________________________________  The [facility] providers would like to encourage you to use [facility] to communicate with providers for non-urgent requests or questions.  Due to long hold times on the telephone, sending your provider a message by [place] may be a faster and more efficient way to get a response.  Please allow 48 business hours for a response.  Please remember that this is for non-urgent requests.   Due to recent changes in healthcare laws, you may see the results of your imaging and laboratory studies on MyChart before your provider has had a chance to review them.  We understand that in some cases there may be results that are confusing or concerning to you. Not all laboratory results come back in the same time frame and the provider may be  waiting for multiple results in order to interpret others.  Please give [place] 48 hours in order for your provider to thoroughly review all the results before contacting the office for clarification of your results.  Thank you for entrusting me with your care and choosing [facility].  Vicie Mutters PA-C

## 2023-02-18 ENCOUNTER — Telehealth: Payer: Self-pay

## 2023-02-18 NOTE — Telephone Encounter (Signed)
I called patient to discuss Plavix clearance no answer left message on voicemail to call office.

## 2023-02-18 NOTE — Telephone Encounter (Signed)
Patient called back advised per Dr Yong Channel it is ok to hold Plavix 5 days prior to procedure there is a  increase stroke risk and if any signs/symptoms of stroke to seek care but Dr Yong Channel still think benefits outweigh risks and he can proceed forward.

## 2023-03-02 ENCOUNTER — Encounter: Payer: Self-pay | Admitting: Gastroenterology

## 2023-03-10 ENCOUNTER — Encounter: Payer: Self-pay | Admitting: Gastroenterology

## 2023-03-10 ENCOUNTER — Ambulatory Visit (AMBULATORY_SURGERY_CENTER): Payer: Medicare Other | Admitting: Gastroenterology

## 2023-03-10 VITALS — BP 98/69 | HR 44 | Temp 97.8°F | Resp 9 | Ht 70.0 in | Wt 195.0 lb

## 2023-03-10 DIAGNOSIS — Z86010 Personal history of colonic polyps: Secondary | ICD-10-CM | POA: Diagnosis not present

## 2023-03-10 DIAGNOSIS — Z09 Encounter for follow-up examination after completed treatment for conditions other than malignant neoplasm: Secondary | ICD-10-CM | POA: Diagnosis not present

## 2023-03-10 MED ORDER — SODIUM CHLORIDE 0.9 % IV SOLN: 500.0000 mL | Freq: Once | INTRAVENOUS | Status: DC

## 2023-03-10 NOTE — Progress Notes (Signed)
Pt's states no medical or surgical changes since previsit or office visit. 

## 2023-03-10 NOTE — Progress Notes (Signed)
See 02/17/2023 H&P, no changes 

## 2023-03-10 NOTE — Op Note (Addendum)
[place] Patient Name: Darrell Gaines Procedure Date: 03/10/2023 10:37 AM MRN: [id number] Endoscopist: Ladene Artist , MD, [id number] Age: 67 Referring MD:  Date of Birth: 1956/06/04 Gender: Male Account #: [id number] Procedure:                Colonoscopy Indications:              Surveillance: Personal history of adenomatous                            polyps on last colonoscopy > 5 years ago Medicines:                Monitored Anesthesia Care Procedure:                Pre-Anesthesia Assessment:                           - Prior to the procedure, a History and Physical                            was performed, and patient medications and                            allergies were reviewed. The patient's tolerance of                            previous anesthesia was also reviewed. The risks                            and benefits of the procedure and the sedation                            options and risks were discussed with the patient.                            All questions were answered, and informed consent                            was obtained. Prior Anticoagulants: The patient has                            taken Plavix (clopidogrel), last dose was 5 days                            prior to procedure. ASA Grade Assessment: III - A                            patient with severe systemic disease. After                            reviewing the risks and benefits, the patient was                            deemed in satisfactory condition to undergo the  procedure.                           After obtaining informed consent, the colonoscope                            was passed under direct vision. Throughout the                            procedure, the patient's blood pressure, pulse, and                            oxygen saturations were monitored continuously. The                            CF [id number] was introduced through the  anus                            and advanced to the the cecum, identified by                            appendiceal orifice and ileocecal valve. The                            ileocecal valve, appendiceal orifice, and rectum                            were photographed. The quality of the bowel                            preparation was good. The colonoscopy was performed                            without difficulty. The patient tolerated the                            procedure well. Scope In: 10:44:27 AM Scope Out: 11:03:25 AM Scope Withdrawal Time: 0 hours 13 minutes 25 seconds  Total Procedure Duration: 0 hours 18 minutes 58 seconds  Findings:                 The perianal and digital rectal examinations were                            normal.                           External hemorrhoids were found during                            retroflexion. The hemorrhoids were small.                           The exam was otherwise without abnormality on  direct and retroflexion views. Complications:            No immediate complications. Estimated blood loss:                            None. Estimated Blood Loss:     Estimated blood loss: none. Impression:               - External hemorrhoids.                           - The examination was otherwise normal on direct                            and retroflexion views.                           - No specimens collected. Recommendation:           - Repeat colonoscopy in 10 years for surveillance.                           - Patient has a contact number available for                            emergencies. The signs and symptoms of potential                            delayed complications were discussed with the                            patient. Return to normal activities tomorrow.                            Written discharge instructions were provided to the                            patient.                            - Resume previous diet.                           - Continue present medications.                           - Resume Plavix (clopidogrel) at prior dose today.                            Refer to managing physician for further adjustment                            of therapy. Ladene Artist, MD 03/10/2023 11:07:40 AM This report has been signed electronically.

## 2023-03-10 NOTE — Patient Instructions (Addendum)
Repeat colonoscopy in 10 years for surveillance. Resume previous diet. Continue present medications. Resume Plavix (clopidogrel) at prior dose today.  Refer to managing physician for further adjustment of therapy.                           Please read over handouts about hemorrhoids and high fiber diets  YOU HAD AN ENDOSCOPIC PROCEDURE TODAY AT [place]:   Refer to the procedure report that was given to you for any specific questions about what was found during the examination.  If the procedure report does not answer your questions, please call your gastroenterologist to clarify.  If you requested that your care partner not be given the details of your procedure findings, then the procedure report has been included in a sealed envelope for you to review at your convenience later.  YOU SHOULD EXPECT: Some feelings of bloating in the abdomen. Passage of more gas than usual.  Walking can help get rid of the air that was put into your GI tract during the procedure and reduce the bloating. If you had a lower endoscopy (such as a colonoscopy or flexible sigmoidoscopy) you may notice spotting of blood in your stool or on the toilet paper. If you underwent a bowel prep for your procedure, you may not have a normal bowel movement for a few days.  Please Note:  You might notice some irritation and congestion in your nose or some drainage.  This is from the oxygen used during your procedure.  There is no need for concern and it should clear up in a day or so.  SYMPTOMS TO REPORT IMMEDIATELY:  Following lower endoscopy (colonoscopy or flexible sigmoidoscopy):  Excessive amounts of blood in the stool  Significant tenderness or worsening of abdominal pains  Swelling of the abdomen that is new, acute  Fever of 100F or higher  For urgent or emergent issues, a gastroenterologist can be reached at any hour by calling [phone]. Do not use MyChart messaging for urgent concerns.     DIET:  We do recommend a small meal at first, but then you may proceed to your regular diet.  Drink plenty of fluids but you should avoid alcoholic beverages for 24 hours.  ACTIVITY:  You should plan to take it easy for the rest of today and you should NOT DRIVE or use heavy machinery until tomorrow (because of the sedation medicines used during the test).    FOLLOW UP: Our staff will call the number listed on your records the next business day following your procedure.  We will call around 7:15- 8:00 am to check on you and address any questions or concerns that you may have regarding the information given to you following your procedure. If we do not reach you, we will leave a message.     SIGNATURES/CONFIDENTIALITY: You and/or your care partner have signed paperwork which will be entered into your electronic medical record.  These signatures attest to the fact that that the information above on your After Visit Summary has been reviewed and is understood.  Full responsibility of the confidentiality of this discharge information lies with you and/or your care-partner.

## 2023-03-10 NOTE — Progress Notes (Signed)
Vss nad trans to pacu 

## 2023-03-11 ENCOUNTER — Telehealth: Payer: Self-pay

## 2023-03-11 NOTE — Telephone Encounter (Signed)
No answer, left message to call if having any issue or concerns, B.Ivelisse Culverhouse RN. 

## 2023-05-12 ENCOUNTER — Ambulatory Visit (INDEPENDENT_AMBULATORY_CARE_PROVIDER_SITE_OTHER): Payer: Medicare Other | Admitting: Family Medicine

## 2023-05-12 ENCOUNTER — Encounter: Payer: Self-pay | Admitting: Family Medicine

## 2023-05-12 VITALS — BP 138/78 | HR 48 | Temp 97.3°F | Ht 70.0 in | Wt 195.4 lb

## 2023-05-12 DIAGNOSIS — L989 Disorder of the skin and subcutaneous tissue, unspecified: Secondary | ICD-10-CM

## 2023-05-12 DIAGNOSIS — E119 Type 2 diabetes mellitus without complications: Secondary | ICD-10-CM | POA: Diagnosis not present

## 2023-05-12 DIAGNOSIS — Z0000 Encounter for general adult medical examination without abnormal findings: Secondary | ICD-10-CM

## 2023-05-12 DIAGNOSIS — Z23 Encounter for immunization: Secondary | ICD-10-CM

## 2023-05-12 DIAGNOSIS — Z125 Encounter for screening for malignant neoplasm of prostate: Secondary | ICD-10-CM | POA: Diagnosis not present

## 2023-05-12 DIAGNOSIS — Z7984 Long term (current) use of oral hypoglycemic drugs: Secondary | ICD-10-CM

## 2023-05-12 DIAGNOSIS — D509 Iron deficiency anemia, unspecified: Secondary | ICD-10-CM | POA: Diagnosis not present

## 2023-05-12 LAB — COMPREHENSIVE METABOLIC PANEL
ALT: 31 U/L (ref 0–53)
AST: 23 U/L (ref 0–37)
Albumin: 4.4 g/dL (ref 3.5–5.2)
Alkaline Phosphatase: 46 U/L (ref 39–117)
BUN: 14 mg/dL (ref 6–23)
CO2: 28 mEq/L (ref 19–32)
Calcium: 9.4 mg/dL (ref 8.4–10.5)
Chloride: 103 mEq/L (ref 96–112)
Creatinine, Ser: 0.94 mg/dL (ref 0.40–1.50)
GFR: 85.07 mL/min (ref >60.00)
Glucose, Bld: 95 mg/dL (ref 70–99)
Potassium: 4.3 mEq/L (ref 3.5–5.1)
Sodium: 139 mEq/L (ref 135–145)
Total Bilirubin: 0.4 mg/dL (ref 0.2–1.2)
Total Protein: 6.7 g/dL (ref 6.0–8.3)

## 2023-05-12 LAB — CBC WITH DIFFERENTIAL/PLATELET
Basophils Absolute: 0 10*3/uL (ref 0.0–0.1)
Basophils Relative: 0.3 % (ref 0.0–3.0)
Eosinophils Absolute: 0.1 10*3/uL (ref 0.0–0.7)
Eosinophils Relative: 1.5 % (ref 0.0–5.0)
HCT: 39.6 % (ref 39.0–52.0)
Hemoglobin: 13.6 g/dL (ref 13.0–17.0)
Lymphocytes Relative: 28.1 % (ref 12.0–46.0)
Lymphs Abs: 1.9 10*3/uL (ref 0.7–4.0)
MCHC: 34.5 g/dL (ref 30.0–36.0)
MCV: 88.4 fl (ref 78.0–100.0)
Monocytes Absolute: 0.5 10*3/uL (ref 0.1–1.0)
Monocytes Relative: 8.1 % (ref 3.0–12.0)
Neutro Abs: 4.2 10*3/uL (ref 1.4–7.7)
Neutrophils Relative %: 62 % (ref 43.0–77.0)
Platelets: 172 10*3/uL (ref 150.0–400.0)
RBC: 4.47 Mil/uL (ref 4.22–5.81)
RDW: 13.6 % (ref 11.5–15.5)
WBC: 6.7 10*3/uL (ref 4.0–10.5)

## 2023-05-12 LAB — LIPID PANEL
Cholesterol: 125 mg/dL (ref 0–200)
HDL: 37.2 mg/dL — ABNORMAL LOW (ref >39.00)
NonHDL: 87.99
Total CHOL/HDL Ratio: 3
Triglycerides: 235 mg/dL — ABNORMAL HIGH (ref 0.0–149.0)
VLDL: 47 mg/dL — ABNORMAL HIGH (ref 0.0–40.0)

## 2023-05-12 LAB — MICROALBUMIN / CREATININE URINE RATIO
Creatinine,U: 25.1 mg/dL
Microalb Creat Ratio: 2.8 mg/g (ref 0.0–30.0)
Microalb, Ur: <0.7 mg/dL (ref 0.0–1.9)

## 2023-05-12 LAB — IBC + FERRITIN
Ferritin: 132.4 ng/mL (ref 22.0–322.0)
Iron: 77 ug/dL (ref 42–165)
Saturation Ratios: 24.4 % (ref 20.0–50.0)
TIBC: 315 ug/dL (ref 250.0–450.0)
Transferrin: 225 mg/dL (ref 212.0–360.0)

## 2023-05-12 LAB — LDL CHOLESTEROL, DIRECT: Direct LDL: 57 mg/dL

## 2023-05-12 LAB — HEMOGLOBIN A1C: Hgb A1c MFr Bld: 6.4 % (ref 4.6–6.5)

## 2023-05-12 LAB — PSA, MEDICARE: PSA: 1.91 ng/ml (ref 0.10–4.00)

## 2023-05-12 MED ORDER — TADALAFIL 20 MG PO TABS: 20.0000 mg | ORAL_TABLET | ORAL | 11 refills | Status: DC | PRN

## 2023-05-12 NOTE — Progress Notes (Signed)
Phone: [phone]   Subjective:  Patient presents today for their Welcome to Medicare Exam    Preventive Screening-Counseling & Management  Vision screen: normal Vision Screening   Right eye Left eye Both eyes  Without correction     With correction 20/16 20/20 20/16    Advanced directives: HCPOA and living will son file with attorney- full code  Modifiable Risk Factors/behavioral risk assessment/psychosocial risk assessment Regular exercise: weights a couple times a week, walks a couple times a week and playing pickleball- in total 5-6 days a week Diet: Discussed whole food plant-based diet and working towards gradual weight loss-congratulated him on being down 4 pounds in the last year- reasonably healthy diet. Thinks could get back down to 180's over time Wt Readings from Last 3 Encounters:  05/12/23 195 lb 6.4 oz (88.6 kg)  03/10/23 195 lb (88.5 kg)  02/17/23 195 lb (88.5 kg)  Smoking Status: Never Smoker Second Hand Smoking status: No smokers in home Alcohol intake: Very rare alcohol intake  Other substance abuse/illicit drugs: None  Cardiac risk factors:  advanced age (older than 6 for men, 41 for women)  Treated hyperlipidemia  no Hypertension - though initial blood pressure slightly high today- high accepetable on repeat  Well controlled diabetes.  Lab Results  Component Value Date   HGBA1C 6.3 11/11/2022  Family History:  CAD in father and heart attack age 49- was told likely infection related - perioscaling, grandfather had heart disease as well  Depression Screen/risk evaluation Risk factors: none. PHQ9 of 0    05/12/2023   12:59 PM 11/11/2022    8:24 AM 05/16/2022    8:03 AM 11/18/2021    8:05 AM 05/15/2021    8:13 AM  Depression screen PHQ 2/9  Decreased Interest 0 0 0 0 1  Down, Depressed, Hopeless 0 0 0 0 1  PHQ - 2 Score 0 0 0 0 2  Altered sleeping 0 0 0 0 1  Tired, decreased energy 0 0 0 0 1  Change in appetite 0 0 0 0 0  Feeling bad or failure  about yourself  0 0 0 0 1  Trouble concentrating 0 0 0 0 1  Moving slowly or fidgety/restless  0 0 0 0  Suicidal thoughts 0 0 0 0 0  PHQ-9 Score 0 0 0 0 6  Difficult doing work/chores Not difficult at all Not difficult at all Not difficult at all Not difficult at all Somewhat difficult    Functional ability and level of safety Mobility assessment:  timed get up and go <12 seconds Activities of Daily Living- Independent in ADLs (toileting, bathing, dressing, transferring, eating) and in IADLs (shopping, housekeeping, managing own medications, and handling finances) Home Safety: Loose rugs (none), smoke detectors (up to date ), small pets (none), grab bars (not at point where needs this), stairs (one level- only one step but no issues with that), life-alert system (keeps cell phone on him) Hearing Difficulties:possible mild hearing issues per wife- already established with Dr. Jenne Pane and he may mention next visit but wants to hold off otherwise Fall Risk: None - did     05/12/2023    1:24 PM 11/11/2022    8:24 AM 10/04/2018   11:27 AM 03/26/2018   12:55 PM  Fall Risk   Falls in the past year? 0 0 No No  Number falls in past yr: 0     Injury with Fall? 0 1- errouneously entered as did not have fall    Risk  for fall due to :  No Fall Risks    Follow up  Falls evaluation completed     Opioid use history:  no long term opioids use Self assessment of health status: "pretty good health"  Required Immunizations needed today:  Prevnar 20 , discussed waiting on COVID shot until the fall  Immunization History  Administered Date(s) Administered   Influenza Whole 10/17/2009, 09/28/2012   Influenza, High Dose Seasonal PF 10/02/2022   Influenza,inj,Quad PF,6+ Mos 09/13/2019, 10/12/2021   Influenza-Unspecified 10/12/2014, 10/09/2016, 09/21/2017, 10/13/2018, 10/10/2020   PFIZER Comirnaty(Gray Top)Covid-19 Tri-Sucrose Vaccine 10/02/2022   PFIZER(Purple Top)SARS-COV-2 Vaccination 03/15/2020,  04/09/2020, 10/13/2020, 05/11/2021   Pfizer Covid-19 Vaccine Bivalent Booster 75yrs & up 10/12/2021   Pneumococcal Polysaccharide-23 11/15/2018   Respiratory Syncytial Virus Vaccine,Recomb Aduvanted(Arexvy) 10/02/2022   Td 12/29/1997, 01/27/2008   Tdap 05/04/2018   Zoster Recombinat (Shingrix) 12/11/2022, 04/03/2023   Health Maintenance  Topic Date Due   Pneumonia Vaccine (2 of 2 - PCV) 11/04/2022   COVID-19 Vaccine (7 - 2023-24 season) 11/27/2022   Hemoglobin A1C  05/12/2023   Yearly kidney health urinalysis for diabetes  05/17/2023   Eye exam for diabetics  05/12/2023*   Flu Shot  07/30/2023   Yearly kidney function blood test for diabetes  11/12/2023   Complete foot exam   05/11/2024   Medicare Annual Wellness Visit  05/11/2024   DTaP/Tdap/Td vaccine (4 - Td or Tdap) 05/04/2028   Colon Cancer Screening  03/09/2033   Hepatitis C Screening: USPSTF Recommendation to screen - Ages 18-79 yo.  Completed   HIV Screening  Completed   Zoster (Shingles) Vaccine  Completed   HPV Vaccine  Aged Out  *Topic was postponed. The date shown is not the original due date.   Screening tests-update A1c and urine microalbumin creatinine ratio today Colon cancer screening- 03/10/2023 with 10-year repeat per Dr. Russella Dar Lung Cancer screening- never smoker so not indicated Skin cancer screening- we typically perform a waist up exam-she does not see dermatology Prostate cancer screening low risk prior PSA trend-check PSA with labs today . BPH on prior exams. Nocturia 2-3 x a night Lab Results  Component Value Date   PSA 1.41 05/16/2022   PSA 1.23 11/18/2021   PSA 1.38 08/19/2021   The following were reviewed and entered/updated in epic: Past Medical History:  Diagnosis Date   Allergy    Anxiety    Arthritis    Depression (disease)    Diabetes mellitus (HCC)    Erosive esophagitis    GERD (gastroesophageal reflux disease)    Hemorrhoids    History of cerebrovascular accident    Hyperlipidemia     Peripheral vascular disease (HCC)    Stroke (HCC)    2001   Subclavian artery stenosis, right (HCC)    Tubular adenoma polyp of rectum 07/2010   Patient Active Problem List   Diagnosis Date Noted   Subclavian artery stenosis, right (HCC) 05/04/2018    Priority: High   Type II diabetes mellitus, well controlled (HCC) 12/11/2014    Priority: High   History of CVA (cerebrovascular accident) 07/26/2007    Priority: High   BPH associated with nocturia 04/24/2016    Priority: Medium    Depression 08/21/2008    Priority: Medium    Hyperlipidemia 07/26/2007    Priority: Medium    Chondromalacia 11/05/2017    Priority: Low   History of adenomatous polyp of colon 04/24/2016    Priority: Low   GERD 07/26/2007    Priority: Low  Recurrent major depressive disorder, in full remission (HCC) 05/16/2022   Visual disturbance 01/07/2018   Past Surgical History:  Procedure Laterality Date   colon polyp removal  1967   COLONOSCOPY    j Family History  Problem Relation Age of Onset   Parkinson's disease Mother    Coronary artery disease Father    Heart attack Father 72       infection related potentially   Heart disease Father    Heart disease Maternal Grandfather        unknown age, MI at some age   Heart attack Maternal Grandfather    Diabetes Maternal Grandmother    Colon cancer Neg Hx    Esophageal cancer Neg Hx    Stomach cancer Neg Hx    Rectal cancer Neg Hx     Medications- reviewed and updated Current Outpatient Medications  Medication Sig Dispense Refill   aspirin 325 MG EC tablet Take 325 mg by mouth daily.     blood glucose meter kit and supplies KIT Dispense based on patient and insurance preference. Use up to four times daily as directed. 1 each 0   clopidogrel (PLAVIX) 75 MG tablet TAKE 1 TABLET(75 MG) BY MOUTH DAILY 90 tablet 3   famotidine (PEPCID) 20 MG tablet TAKE 1 TABLET BY MOUTH TWICE DAILY TO BE TAKEN WITH NSAID 180 tablet 3   ferrous sulfate 325 (65 FE)  MG EC tablet Take 325 mg by mouth daily with breakfast.     fish oil-omega-3 fatty acids 1000 MG capsule Take 3 g by mouth daily.     metFORMIN (GLUCOPHAGE) 500 MG tablet TAKE 1 TABLET(500 MG) BY MOUTH DAILY WITH BREAKFAST 90 tablet 3   Miconazole Nitrate 2 % OINT two times per day for one to three weeks and repeated as necessary for angular cheilitis 141 g 1   niacin 500 MG tablet Take 500 mg by mouth. 1 po at bedtime     PARoxetine (PAXIL) 10 MG tablet Take 1 tablet (10 mg total) by mouth daily. 90 tablet 3   rosuvastatin (CRESTOR) 40 MG tablet TAKE 1 TABLET(40 MG) BY MOUTH DAILY 90 tablet 3   tamsulosin (FLOMAX) 0.4 MG CAPS capsule TAKE 1 CAPSULE(0.4 MG) BY MOUTH DAILY 90 capsule 3   No current facility-administered medications for this visit.    Allergies-reviewed and updated No Known Allergies  Social History   Socioeconomic History   Marital status: Married    Spouse name: Not on file   Number of children: 2   Years of education: Not on file   Highest education level: Not on file  Occupational History   Occupation: retired  Tobacco Use   Smoking status: Never   Smokeless tobacco: Never  Vaping Use   Vaping Use: Never used  Substance and Sexual Activity   Alcohol use: Yes    Alcohol/week: 0.0 standard drinks of alcohol    Comment: Occasional   Drug use: No   Sexual activity: Not on file  Other Topics Concern   Not on file  Social History Narrative   Married (spouse at outside practice)   - 1 son in graduate schoo/PHDl at [employer]- art degree- more graphical side)   - 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 will teaching there as well after finishing Phd       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, 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 Oropharynx normal Neck: no thyromegaly CV: RRR no murmurs rubs or gallops Lungs: CTAB no crackles, wheeze, rhonchi Abdomen: soft/nontender/nondistended/normal bowel sounds. No rebound or guarding.  Ext: no edema Skin: warm, dry Neuro: grossly normal, moves all extremities, PERRLA   Diabetic Foot Exam - Simple   Simple Foot Form Diabetic Foot exam was performed with the following findings: Yes 05/12/2023  1:15 PM  Visual Inspection No deformities, no ulcerations, no other skin breakdown bilaterally: Yes Sensation Testing Intact to touch and monofilament testing bilaterally: Yes Pulse Check Posterior Tibialis and Dorsalis pulse intact bilaterally: Yes Comments      Assessment and Plan:   Welcome to Medicare exam completed-  Educated, counseled and referred based on above elements Educated, counseled and referred as appropriate for preventative needs Discussed and documented a written plan for preventiative services and screenings with personalized health advice- After Visit Summary was given to patient which included this plan  4. EKG offered [id number]- patient  opts out- already has one on file from 2019 with cardiology   Status of chronic or acute concerns   # Social update-retired in 2023-he had planned to pick up pickleball and has- enjoying retirement   #History of stroke-patient remains on Plavix 75 mg ans aspirin 325 mg per Dr. Edilia Bo recommendations   #subclavian artery stenosis-remains on risk factor modification with statin, Plavix.  Originally presented with  subclavian steal syndrome with vertigo and blurred vision but declines any recent issues.  Dr. Edilia Bo in 2019 said prn follow up for concerns per patient.   # Diabetes S: Medication:Metformin 500 mg daily Lab Results  Component Value Date   HGBA1C 6.3 11/11/2022   HGBA1C 6.3 05/16/2022   HGBA1C 6.3 11/18/2021  A/P: excellent control last visit- update a1c with labs continue current medications    #hyperlipidemia/LFT elevation.  LDL goal under 70 with history of stroke S: Medication:Rosuvastatin 40 mg, fish oil, niacin- not required Lab Results  Component Value Date   CHOL 111 05/16/2022   HDL 30.80 (L) 05/16/2022   LDLCALC 43 05/16/2022   LDLDIRECT 63 11/13/2020   TRIG 188.0 (H) 05/16/2022   CHOLHDL 4 05/16/2022  A/P: last lipids reasonably well controlled- continue current medications  -Patient with mild AST elevation  in the pats but normal last visit- check again Lab Results  Component Value Date   ALT 39 11/11/2022   AST 27 11/11/2022   ALKPHOS 52 11/11/2022   BILITOT 0.6 11/11/2022   # Depression S: Medication:Paxil 10 mg trial 2023 down from 20 mg.  We opted to try this after he retired    05/12/2023   12:59 PM 11/11/2022    8:24 AM 05/16/2022    8:03 AM  Depression screen PHQ 2/9  Decreased Interest 0 0 0  Down, Depressed, Hopeless 0 0 0  PHQ - 2 Score 0 0 0  Altered sleeping 0 0  0  Tired, decreased energy 0 0 0  Change in appetite 0 0 0  Feeling bad or failure about yourself  0 0 0  Trouble concentrating 0 0 0  Moving slowly or fidgety/restless  0 0  Suicidal thoughts 0 0 0  PHQ-9 Score 0 0 0  Difficult doing work/chores Not difficult at all Not difficult at all Not difficult at all  A/P: full remission- wanted to maintain on lower dose for now instead of trialing off   #BPH S:Nocturia up to 3-5 times a night even  on Flomax last visit down to 2-3x a night with retiement - in past We added low-dose finasteride 2.5 mg but did not help over 5 months and  decreased libido A/P: reasonable control- check cbc   #Anemia/low ferritin in the past-patient is on iron.  Up-to-date on colonoscopy.  Ferritin normal last check but update today Lab Results  Component Value Date   WBC 5.5 05/16/2022   HGB 14.3 05/16/2022   HCT 42.3 05/16/2022   MCV 89.7 05/16/2022   PLT 178.0 05/16/2022   Tympanostomy tube 2017 in left ear- replaced in 2020 and 01/14/22 with Dr. Jenne Pane  #BP high normal today- higher than usual for him- we opted to monitor- no need for medications  #prior skin lesion on his back and saw Dr. Roderic Scarce- would like to reestablish to recheck  #erectile dysfunction (ED) - able to get erection but harder to maintain or ejaculate- would like to trial Viagra or Cialis- opted for Cialis- if has chest pain or shortness of breath after use should seek care but doubt that will happen  #Hand pain- thumb pain with playing guitar and would really like to be able to play - Consider diclofenac/Voltaren gel 4x a day for up to 2 weeks to settle thumb down after playing and if that isn't helpful we could refer to sports medicine or orthopedic to consider injection  Recommended follow up: 6 months cpe   Lab/Order associations:   ICD-10-CM   1. Preventative health care  Z00.00     2. Skin lesion  L98.9     3. Screening for prostate cancer  Z12.5     4. Type II diabetes mellitus, well controlled (HCC)  E11.9     5. Need for pneumococcal 20-valent conjugate vaccination  Z23     6. Iron deficiency anemia, unspecified iron deficiency anemia type  D50.9       No orders of the defined types were placed in this encounter.   Return precautions advised. Tana Conch, MD

## 2023-05-12 NOTE — Patient Instructions (Addendum)
Have a copy of your diabetic eye exam sent to [place] at [phone].  Prevnar 20 today  Consider COVID shot in fall along with flu shot but make sure updated vaccine first  We have placed a referral for you today to [facility] dermatology. In some cases you will see # listed below- you can call this if you have not heard within a week. If you do not see # listed- you should receive a mychart message or phone call within a week with the # to call- otherwise please call them directly  Please stop by lab before you go If you have mychart- we will send your results within 3 business days of [place] receiving them.  If you do not have mychart- we will call you about results within 5 business days of [place] receiving them.  *please also note that you will see labs on mychart as soon as they post. I will later go in and write notes on them- will say "notes from Dr. Durene Cal"   Darrell Gaines , Thank you for taking time to come for your Medicare Wellness Visit. I appreciate your ongoing commitment to your health goals. Please review the following plan we discussed and let me know if I can assist you in the future.   These are the goals we discussed: Continue excellent job with exercise Get baseline dermatology evaluation Trial only half tablet of Cialis to start Consider diclofenac/Voltaren gel 4x a day for up to 2 weeks to settle thumb down after playing and if that isn't helpful we could refer to sports medicine or orthopedic to consider injection  This is a list of the screening recommended for you and due dates:  Health Maintenance  Topic Date Due   Pneumonia Vaccine (2 of 2 - PCV) 11/04/2022   COVID-19 Vaccine (7 - 2023-24 season) 11/27/2022   Hemoglobin A1C  05/12/2023   Yearly kidney health urinalysis for diabetes  05/17/2023   Eye exam for diabetics  05/12/2023*   Flu Shot  07/30/2023   Yearly kidney function blood test for diabetes  11/12/2023   Complete foot exam   05/11/2024   Medicare Annual Wellness  Visit  05/11/2024   DTaP/Tdap/Td vaccine (4 - Td or Tdap) 05/04/2028   Colon Cancer Screening  03/09/2033   Hepatitis C Screening: USPSTF Recommendation to screen - Ages 51-79 yo.  Completed   HIV Screening  Completed   Zoster (Shingles) Vaccine  Completed   HPV Vaccine  Aged Out  *Topic was postponed. The date shown is not the original due date.   Health Maintenance Due  Topic Date Due   Pneumonia Vaccine 35+ Years old (2 of 2 - PCV) 11/04/2022   COVID-19 Vaccine (7 - 2023-24 season) 11/27/2022   HEMOGLOBIN A1C  05/12/2023   Diabetic kidney evaluation - Urine ACR  05/17/2023    Recommended follow up: Return in about 6 months (around 11/12/2023) for physical or sooner if needed.Schedule b4 you leave.

## 2023-05-14 ENCOUNTER — Telehealth: Payer: Self-pay | Admitting: Family Medicine

## 2023-05-14 DIAGNOSIS — R972 Elevated prostate specific antigen [PSA]: Secondary | ICD-10-CM

## 2023-05-14 NOTE — Telephone Encounter (Signed)
Orders have been placed, ok to schedule. 

## 2023-05-14 NOTE — Telephone Encounter (Signed)
Patient states he was advised to come back in mid July for Lab only appointment to check PSA. No Orders in system. Unable to schedule.  Please advise.

## 2023-05-19 ENCOUNTER — Other Ambulatory Visit: Payer: Self-pay

## 2023-05-19 MED ORDER — CLOPIDOGREL BISULFATE 75 MG PO TABS: ORAL_TABLET | ORAL | 3 refills | Status: DC

## 2023-06-02 ENCOUNTER — Other Ambulatory Visit: Payer: Self-pay

## 2023-06-02 DIAGNOSIS — E119 Type 2 diabetes mellitus without complications: Secondary | ICD-10-CM | POA: Diagnosis not present

## 2023-06-02 LAB — HM DIABETES EYE EXAM

## 2023-06-02 MED ORDER — ROSUVASTATIN CALCIUM 40 MG PO TABS: ORAL_TABLET | ORAL | 3 refills | Status: DC

## 2023-06-02 MED ORDER — TAMSULOSIN HCL 0.4 MG PO CAPS: ORAL_CAPSULE | ORAL | 3 refills | Status: DC

## 2023-07-08 ENCOUNTER — Other Ambulatory Visit (INDEPENDENT_AMBULATORY_CARE_PROVIDER_SITE_OTHER): Payer: Medicare Other

## 2023-07-08 DIAGNOSIS — R972 Elevated prostate specific antigen [PSA]: Secondary | ICD-10-CM | POA: Diagnosis not present

## 2023-07-08 LAB — PSA: PSA: 1.93 ng/mL (ref 0.10–4.00)

## 2023-07-13 ENCOUNTER — Other Ambulatory Visit: Payer: Self-pay

## 2023-07-13 ENCOUNTER — Telehealth: Payer: Self-pay | Admitting: Family Medicine

## 2023-07-13 DIAGNOSIS — R972 Elevated prostate specific antigen [PSA]: Secondary | ICD-10-CM

## 2023-07-13 NOTE — Telephone Encounter (Signed)
LVM to schedule lab only visit for PSA

## 2023-07-27 ENCOUNTER — Other Ambulatory Visit: Payer: Self-pay | Admitting: Family Medicine

## 2023-07-28 ENCOUNTER — Ambulatory Visit (INDEPENDENT_AMBULATORY_CARE_PROVIDER_SITE_OTHER): Payer: Medicare Other | Admitting: Family

## 2023-07-28 ENCOUNTER — Encounter: Payer: Self-pay | Admitting: Family

## 2023-07-28 VITALS — BP 124/82 | HR 72 | Temp 97.7°F | Wt 192.0 lb

## 2023-07-28 DIAGNOSIS — H6092 Unspecified otitis externa, left ear: Secondary | ICD-10-CM

## 2023-07-28 DIAGNOSIS — H9202 Otalgia, left ear: Secondary | ICD-10-CM | POA: Diagnosis not present

## 2023-07-28 MED ORDER — CIPROFLOXACIN-DEXAMETHASONE 0.3-0.1 % OT SUSP: 4.0000 [drp] | Freq: Two times a day (BID) | OTIC | 0 refills | Status: DC

## 2023-07-28 NOTE — Progress Notes (Signed)
   Acute Office Visit  Subjective:     Patient ID: Darrell Gaines, male    DOB: 27-Oct-1956, 67 y.o.   MRN: [id number]  Chief Complaint  Patient presents with  . Ear Pain    Left ear pain. Possible water in ear.     HPI Patient is in today with complaints of left ear pain.  [place] and [place] he did not put his head underneath the water he may have gotten water in his left ear.  Has a history of ear infections in the past.  Reports having drainage and a gritty feeling inside of his ear.  No sneezing coughing or congestion.  Review of Systems  Constitutional: Negative.  Negative for chills and fever.  HENT:  Positive for ear discharge and ear pain. Negative for congestion.   Respiratory: Negative.    Cardiovascular: Negative.   Musculoskeletal: Negative.   Skin: Negative.   Neurological: Negative.   Psychiatric/Behavioral: Negative.          Objective:    BP 124/82 (BP Location: Left Arm, Patient Position: Sitting, Cuff Size: Large)   Pulse 72   Temp 97.7 F (36.5 C) (Temporal)   Wt 192 lb (87.1 kg)   SpO2 98%   BMI 27.55 kg/m    Physical Exam Vitals and nursing note reviewed.  Constitutional:      Appearance: Normal appearance. He is normal weight.  HENT:     Right Ear: Tympanic membrane, ear canal and external ear normal. There is no impacted cerumen.     Ears:     Comments: Left ear: Mildly tender to palpation. Purulent drainage. Unable to completely visualize the TM    Mouth/Throat:     Mouth: Mucous membranes are moist.  Cardiovascular:     Rate and Rhythm: Normal rate and regular rhythm.  Pulmonary:     Effort: Pulmonary effort is normal.     Breath sounds: Normal breath sounds.  Musculoskeletal:        General: Normal range of motion.  Neurological:     General: No focal deficit present.     Mental Status: He is alert and oriented to person, place, and time.  Psychiatric:        Mood and Affect: Mood normal.         Behavior: Behavior normal.    No results found for any visits on 07/28/23.      Assessment & Plan:   Problem List Items Addressed This Visit   None Visit Diagnoses     Otitis externa of left ear, unspecified chronicity, unspecified type    -  Primary   Left ear pain           Meds ordered this encounter  Medications  . ciprofloxacin-dexamethasone (CIPRODEX) OTIC suspension    Sig: Place 4 drops into the left ear 2 (two) times daily.    Dispense:  7.5 mL    Refill:  0   Call the office if symptoms worsen or persist.  Recheck as scheduled and sooner as needed. No follow-ups on file.  Eulis Foster, FNP

## 2023-08-03 DIAGNOSIS — L570 Actinic keratosis: Secondary | ICD-10-CM | POA: Diagnosis not present

## 2023-08-03 DIAGNOSIS — L821 Other seborrheic keratosis: Secondary | ICD-10-CM | POA: Diagnosis not present

## 2023-09-09 ENCOUNTER — Other Ambulatory Visit (INDEPENDENT_AMBULATORY_CARE_PROVIDER_SITE_OTHER): Payer: Medicare Other

## 2023-09-09 DIAGNOSIS — R972 Elevated prostate specific antigen [PSA]: Secondary | ICD-10-CM

## 2023-09-09 LAB — PSA: PSA: 1.68 ng/mL (ref 0.10–4.00)

## 2023-09-11 ENCOUNTER — Encounter: Payer: Self-pay | Admitting: *Deleted

## 2023-09-16 DIAGNOSIS — K080 Exfoliation of teeth due to systemic causes: Secondary | ICD-10-CM | POA: Diagnosis not present

## 2023-10-26 ENCOUNTER — Other Ambulatory Visit: Payer: Self-pay | Admitting: Family Medicine

## 2023-11-01 ENCOUNTER — Telehealth: Payer: Self-pay

## 2023-11-01 NOTE — Progress Notes (Signed)
This patient is appearing on a report for being at risk of failing the adherence measure for cholesterol (statin) and diabetes medications this calendar year.   Medication: metformin 500 mg PO daily, rosuvastatin 40 mg PO daily Last fill date metformin: 07/27/23 for 90 day supply Last fill date rosuvastatin: 06/02/23 for 90 days supply  Left voicemail for patient to return my call at their convenience. Pt has refills remaining for both listed medications. A1c (6.4%) and LDL-C (57 mg/dL) were both controlled at last PCP appt in May 2024. PMH includes CVA< subclavian artery stenosis. Will also reach out to patient via MyChart.   Nils Pyle, PharmD PGY1 Pharmacy Resident

## 2023-11-04 ENCOUNTER — Encounter: Payer: Self-pay | Admitting: Pharmacist

## 2023-11-04 NOTE — Progress Notes (Signed)
Pharmacy Quality Measure Review  This patient is appearing on a report for being at risk of failing the adherence measure for diabetes medications this calendar year.   Medication: metformin 500mg  Last fill date: 07/27/2023 for 90 day supply  Reviewed Dr Tiajuana Amass database. Patient filled metformin for 90 DS on 11/02/2023 for 90 DS Likely after reminder sent by pharmacy resident 11/01/2023  Henrene Pastor, PharmD Clinical Pharmacist [facility]  Population Health [phone]

## 2023-11-07 ENCOUNTER — Other Ambulatory Visit: Payer: Self-pay | Admitting: Family Medicine

## 2023-11-17 ENCOUNTER — Ambulatory Visit (INDEPENDENT_AMBULATORY_CARE_PROVIDER_SITE_OTHER): Payer: Medicare Other | Admitting: Family Medicine

## 2023-11-17 ENCOUNTER — Encounter: Payer: Self-pay | Admitting: Family Medicine

## 2023-11-17 VITALS — BP 130/72 | HR 49 | Temp 97.9°F | Ht 70.0 in | Wt 189.4 lb

## 2023-11-17 DIAGNOSIS — Z0000 Encounter for general adult medical examination without abnormal findings: Secondary | ICD-10-CM

## 2023-11-17 DIAGNOSIS — E785 Hyperlipidemia, unspecified: Secondary | ICD-10-CM

## 2023-11-17 DIAGNOSIS — E119 Type 2 diabetes mellitus without complications: Secondary | ICD-10-CM | POA: Diagnosis not present

## 2023-11-17 DIAGNOSIS — Z125 Encounter for screening for malignant neoplasm of prostate: Secondary | ICD-10-CM

## 2023-11-17 DIAGNOSIS — Z7984 Long term (current) use of oral hypoglycemic drugs: Secondary | ICD-10-CM

## 2023-11-17 DIAGNOSIS — I771 Stricture of artery: Secondary | ICD-10-CM

## 2023-11-17 DIAGNOSIS — F3342 Major depressive disorder, recurrent, in full remission: Secondary | ICD-10-CM

## 2023-11-17 NOTE — Patient Instructions (Addendum)
Please stop by lab before you go If you have mychart- we will send your results within 3 business days of [place] receiving them.  If you do not have mychart- we will call you about results within 5 business days of [place] receiving them.  *please also note that you will see labs on mychart as soon as they post. I will later go in and write notes on them- will say "notes from Dr. Durene Cal"   Recommended follow up: Return in about 6 months (around 05/16/2024) for followup or sooner if needed.Schedule b4 you leave.

## 2023-11-17 NOTE — Addendum Note (Signed)
Addended by: Shelva Majestic on: 11/17/2023 03:16 PM   Modules accepted: Level of Service

## 2023-11-17 NOTE — Progress Notes (Addendum)
Phone: [phone]   Subjective:  Patient presents today for their annual physical. Chief complaint-noted.   See problem oriented charting- ROS- full  review of systems was completed and negative  Per full ROS sheet completed by patient  The following were reviewed and entered/updated in epic: Past Medical History:  Diagnosis Date   Allergy    Anxiety    Arthritis    Depression (disease)    Diabetes mellitus (HCC)    Erosive esophagitis    GERD (gastroesophageal reflux disease)    Hemorrhoids    History of cerebrovascular accident    Hyperlipidemia    Peripheral vascular disease (HCC)    Stroke (HCC)    2001   Subclavian artery stenosis, right (HCC)    Tubular adenoma polyp of rectum 07/2010   Patient Active Problem List   Diagnosis Date Noted   Subclavian artery stenosis, right (HCC) 05/04/2018    Priority: High   Type II diabetes mellitus, well controlled (HCC) 12/11/2014    Priority: High   History of CVA (cerebrovascular accident) 07/26/2007    Priority: High   BPH associated with nocturia 04/24/2016    Priority: Medium    Depression 08/21/2008    Priority: Medium    Hyperlipidemia 07/26/2007    Priority: Medium    Chondromalacia 11/05/2017    Priority: Low   History of adenomatous polyp of colon 04/24/2016    Priority: Low   GERD 07/26/2007    Priority: Low   Recurrent major depressive disorder, in full remission (HCC) 05/16/2022   Visual disturbance 01/07/2018   Past Surgical History:  Procedure Laterality Date   APPENDECTOMY  1967   colon polyp removal  12/29/1965   COLON SURGERY  1967   Polp removal   COLONOSCOPY      Family History  Problem Relation Age of Onset   Parkinson's disease Mother    Coronary artery disease Father    Heart attack Father 28       infection related potentially   Heart disease Father    Heart disease Maternal Grandfather        unknown age, MI at some age   Heart attack Maternal Grandfather    Diabetes Maternal  Grandmother    Colon cancer Neg Hx    Esophageal cancer Neg Hx    Stomach cancer Neg Hx    Rectal cancer Neg Hx     Medications- reviewed and updated Current Outpatient Medications  Medication Sig Dispense Refill   aspirin 325 MG EC tablet Take 325 mg by mouth daily.     blood glucose meter kit and supplies KIT Dispense based on patient and insurance preference. Use up to four times daily as directed. 1 each 0   ciprofloxacin-dexamethasone (CIPRODEX) OTIC suspension Place 4 drops into the left ear 2 (two) times daily. 7.5 mL 0   clopidogrel (PLAVIX) 75 MG tablet TAKE 1 TABLET(75 MG) BY MOUTH DAILY 90 tablet 3   famotidine (PEPCID) 20 MG tablet TAKE 1 TABLET BY MOUTH TWICE DAILY TO BE TAKEN WITH NSAID 180 tablet 3   ferrous sulfate 325 (65 FE) MG EC tablet Take 325 mg by mouth daily with breakfast.     fish oil-omega-3 fatty acids 1000 MG capsule Take 3 g by mouth daily.     metFORMIN (GLUCOPHAGE) 500 MG tablet TAKE 1 TABLET(500 MG) BY MOUTH DAILY WITH BREAKFAST 90 tablet 3   Miconazole Nitrate 2 % OINT two times per day for one to three weeks and repeated  as necessary for angular cheilitis 141 g 1   niacin 500 MG tablet Take 500 mg by mouth. 1 po at bedtime     PARoxetine (PAXIL) 10 MG tablet TAKE 1 TABLET(10 MG) BY MOUTH DAILY 90 tablet 3   rosuvastatin (CRESTOR) 40 MG tablet TAKE 1 TABLET(40 MG) BY MOUTH DAILY 90 tablet 3   tadalafil (CIALIS) 20 MG tablet Take 1 tablet (20 mg total) by mouth every 3 (three) days as needed for erectile dysfunction. 10 tablet 11   tamsulosin (FLOMAX) 0.4 MG CAPS capsule TAKE 1 CAPSULE(0.4 MG) BY MOUTH DAILY 90 capsule 3   No current facility-administered medications for this visit.    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 Oropharynx normal, tympanostomy tube in place on right Neck: no thyromegaly CV: mildly bradycardic but regular- no murmurs rubs or gallops Lungs: CTAB no crackles, wheeze, rhonchi Abdomen: soft/nontender/nondistended/normal bowel sounds. No rebound or guarding.  Ext: no edema Skin: warm, dry Neuro: grossly normal, moves all extremities, PERRLA   Assessment and Plan  67 y.o. male presenting for annual physical.  Health Maintenance counseling: 1. Anticipatory guidance: Patient counseled regarding regular dental exams -q6 months, eye exams -yearly,  avoiding smoking and second hand smoke , limiting alcohol to 2 beverages per day - none, no illicit drugs .   2. Risk factor reduction:  Advised patient of need for regular exercise and diet rich and fruits and vegetables to reduce risk of heart attack and stroke.  Exercise- doing pickleball and fair amount of yard work- plus prepping yard for son's wedding next year.  Diet/weight management-down 6 lbs . Activity up lately Wt Readings from Last 3 Encounters:  11/17/23 189 lb 6.4 oz (85.9 kg)  07/28/23 192 lb (87.1 kg)  05/12/23 195 lb 6.4 oz (88.6 kg)  3. Immunizations/screenings/ancillary studies- fully up to date  Immunization History  Administered Date(s) Administered   Influenza Whole 10/17/2009, 09/28/2012   Influenza, High Dose Seasonal PF 10/02/2022, 10/13/2023   Influenza,inj,Quad PF,6+ Mos 09/13/2019, 10/12/2021   Influenza-Unspecified 10/12/2014, 10/09/2016, 09/21/2017,  10/13/2018, 10/10/2020   PFIZER Comirnaty(Gray Top)Covid-19 Tri-Sucrose Vaccine 10/02/2022   PFIZER(Purple Top)SARS-COV-2 Vaccination 03/15/2020, 04/09/2020, 10/13/2020, 05/11/2021   PNEUMOCOCCAL CONJUGATE-20 05/12/2023   Pfizer Covid-19 Vaccine Bivalent Booster 65yrs & up 10/12/2021   Pfizer(Comirnaty)Fall Seasonal Vaccine 12 years and older 10/13/2023   Pneumococcal Polysaccharide-23 11/15/2018   Respiratory Syncytial Virus Vaccine,Recomb Aduvanted(Arexvy) 10/02/2022   Td 12/29/1997, 01/27/2008   Tdap 05/04/2018   Zoster Recombinant(Shingrix) 12/11/2022, 04/03/2023   4. Prostate cancer screening- PSA had increased on last visit but came back down-check annually- next physical  Lab Results  Component Value Date   PSA 1.68 09/09/2023   PSA 1.93 07/08/2023  PSA 1.91 05/12/2023   5. Colon cancer screening - 03/10/2023 with 10-year repeat per Dr. Russella Dar  6. Skin cancer screening- Dr. Roderic Scarce did see him- q3 year follow up likely 2027. advised regular sunscreen use. Denies worrisome, changing, or new skin lesions.  7. Smoking associated screening (lung cancer screening, AAA screen 65-75, UA)- never smoker 8. STD screening - only active with spouse   Status of chronic or acute concerns    #History of stroke-patient remains on Plavix 75 mg and aspirin 325 mg per Dr. Edilia Bo recommendations   #subclavian artery stenosis-remains on risk factor modification with statin, Plavix.  Originally presented with subclavian steal syndrome with vertigo and blurred vision- no further issues.   Dr. Edilia Bo in 2019 said prn follow up for concerns per patient.   # Diabetes S: Medication: Metformin 500 mg daily Lab Results  Component Value Date   HGBA1C 6.4 05/12/2023   HGBA1C 6.3 11/11/2022   HGBA1C 6.3 05/16/2022  A/P: hopefully stable- update a1c today. Continue current meds for now    #hyperlipidemia/LFT elevation.  LDL goal under 70 with history of stroke S: Medication:Rosuvastatin 40 mg, fish oil,  niacin. Lab Results  Component Value Date   CHOL 125 05/12/2023   HDL 37.20 (L) 05/12/2023   LDLCALC 43 05/16/2022   LDLDIRECT 57.0 05/12/2023   TRIG 235.0 (H) 05/12/2023   CHOLHDL 3 05/12/2023  A/P: reasonable control- continue current medications -considered vascepa but hold off  # Depression S: Medication:Paxil 10 mg trial 2023 down from 20 mg.      11/17/2023    2:25 PM 05/12/2023   12:59 PM 11/11/2022    8:24 AM  Depression screen PHQ 2/9  Decreased Interest 0 0 0  Down, Depressed, Hopeless 1 0 0  PHQ - 2 Score 1 0 0  Altered sleeping 0 0 0  Tired, decreased energy 1 0 0  Change in appetite 0 0 0  Feeling bad or failure about yourself  1 0 0  Trouble concentrating 0 0 0  Moving slowly or fidgety/restless 0  0  Suicidal thoughts 0 0 0  PHQ-9 Score 3 0 0  Difficult doing work/chores Not difficult at all Not difficult at all Not difficult at all  A/P: doing well/full remission- continue current medications    #BPH S:Nocturia up to 3 times a night even  on Flomax last visit.   - in past We added low-dose finasteride 2.5 mg but did not help over 5 months and decreased libido A/P: tolerable but imperfect control- continue current medications    #Anemia/low ferritin in the past-patient is on iron.  Up-to-date on colonoscopy.  Ferritin normal last check- check nexxt visit Lab Results  Component Value Date   FERRITIN 132.4 05/12/2023   #Cialis trial was helpful- may trial full pill  Recommended follow up: Return in about 6 months (around 05/16/2024) for followup or sooner if needed.Schedule b4 you leave.  Lab/Order associations: fasting   ICD-10-CM   1. Preventative health care  Z00.00     2. Type II diabetes mellitus, well controlled (HCC)  E11.9 Hemoglobin A1c    Comprehensive metabolic panel    CBC with Differential/Platelet    3. Hyperlipidemia, unspecified hyperlipidemia type  E78.5     4. Screening for prostate cancer  Z12.5     5. Recurrent major  depressive disorder, in full remission (HCC) Chronic F33.42     6. Subclavian artery stenosis, right (HCC) Chronic I77.1       No  orders of the defined types were placed in this encounter.   Return precautions advised.  Tana Conch, MD

## 2023-11-18 LAB — COMPREHENSIVE METABOLIC PANEL
ALT: 37 U/L (ref 0–53)
AST: 26 U/L (ref 0–37)
Albumin: 4.6 g/dL (ref 3.5–5.2)
Alkaline Phosphatase: 48 U/L (ref 39–117)
BUN: 17 mg/dL (ref 6–23)
CO2: 28 meq/L (ref 19–32)
Calcium: 9.4 mg/dL (ref 8.4–10.5)
Chloride: 102 meq/L (ref 96–112)
Creatinine, Ser: 0.95 mg/dL (ref 0.40–1.50)
GFR: 83.69 mL/min (ref >60.00)
Glucose, Bld: 92 mg/dL (ref 70–99)
Potassium: 4 meq/L (ref 3.5–5.1)
Sodium: 136 meq/L (ref 135–145)
Total Bilirubin: 0.6 mg/dL (ref 0.2–1.2)
Total Protein: 7.1 g/dL (ref 6.0–8.3)

## 2023-11-18 LAB — CBC WITH DIFFERENTIAL/PLATELET
Basophils Absolute: 0 10*3/uL (ref 0.0–0.1)
Basophils Relative: 0.5 % (ref 0.0–3.0)
Eosinophils Absolute: 0.1 10*3/uL (ref 0.0–0.7)
Eosinophils Relative: 1.6 % (ref 0.0–5.0)
HCT: 42.2 % (ref 39.0–52.0)
Hemoglobin: 14.3 g/dL (ref 13.0–17.0)
Lymphocytes Relative: 32 % (ref 12.0–46.0)
Lymphs Abs: 1.9 10*3/uL (ref 0.7–4.0)
MCHC: 33.8 g/dL (ref 30.0–36.0)
MCV: 90.5 fL (ref 78.0–100.0)
Monocytes Absolute: 0.5 10*3/uL (ref 0.1–1.0)
Monocytes Relative: 8.2 % (ref 3.0–12.0)
Neutro Abs: 3.4 10*3/uL (ref 1.4–7.7)
Neutrophils Relative %: 57.7 % (ref 43.0–77.0)
Platelets: 183 10*3/uL (ref 150.0–400.0)
RBC: 4.67 Mil/uL (ref 4.22–5.81)
RDW: 13.6 % (ref 11.5–15.5)
WBC: 5.9 10*3/uL (ref 4.0–10.5)

## 2023-11-18 LAB — HEMOGLOBIN A1C: Hgb A1c MFr Bld: 6.3 % (ref 4.6–6.5)

## 2024-02-23 DIAGNOSIS — Z9622 Myringotomy tube(s) status: Secondary | ICD-10-CM | POA: Diagnosis not present

## 2024-02-23 DIAGNOSIS — H90A21 Sensorineural hearing loss, unilateral, right ear, with restricted hearing on the contralateral side: Secondary | ICD-10-CM | POA: Diagnosis not present

## 2024-02-23 DIAGNOSIS — H90A32 Mixed conductive and sensorineural hearing loss, unilateral, left ear with restricted hearing on the contralateral side: Secondary | ICD-10-CM | POA: Diagnosis not present

## 2024-03-28 DIAGNOSIS — K080 Exfoliation of teeth due to systemic causes: Secondary | ICD-10-CM | POA: Diagnosis not present

## 2024-05-03 ENCOUNTER — Other Ambulatory Visit: Payer: Self-pay | Admitting: Family Medicine

## 2024-05-05 ENCOUNTER — Encounter (HOSPITAL_COMMUNITY): Payer: Self-pay

## 2024-05-12 ENCOUNTER — Ambulatory Visit

## 2024-05-16 ENCOUNTER — Encounter: Payer: Self-pay | Admitting: Family Medicine

## 2024-05-16 ENCOUNTER — Ambulatory Visit: Payer: Medicare Other | Admitting: Family Medicine

## 2024-05-16 ENCOUNTER — Ambulatory Visit: Payer: Self-pay | Admitting: Family Medicine

## 2024-05-16 VITALS — BP 120/60 | HR 51 | Temp 97.5°F | Ht 70.0 in | Wt 195.0 lb

## 2024-05-16 DIAGNOSIS — R0683 Snoring: Secondary | ICD-10-CM | POA: Diagnosis not present

## 2024-05-16 DIAGNOSIS — E785 Hyperlipidemia, unspecified: Secondary | ICD-10-CM

## 2024-05-16 DIAGNOSIS — Z7984 Long term (current) use of oral hypoglycemic drugs: Secondary | ICD-10-CM

## 2024-05-16 DIAGNOSIS — E119 Type 2 diabetes mellitus without complications: Secondary | ICD-10-CM | POA: Diagnosis not present

## 2024-05-16 DIAGNOSIS — I771 Stricture of artery: Secondary | ICD-10-CM | POA: Diagnosis not present

## 2024-05-16 LAB — COMPREHENSIVE METABOLIC PANEL WITH GFR
ALT: 41 U/L (ref 0–53)
AST: 27 U/L (ref 0–37)
Albumin: 4.4 g/dL (ref 3.5–5.2)
Alkaline Phosphatase: 43 U/L (ref 39–117)
BUN: 23 mg/dL (ref 6–23)
CO2: 26 meq/L (ref 19–32)
Calcium: 9.1 mg/dL (ref 8.4–10.5)
Chloride: 104 meq/L (ref 96–112)
Creatinine, Ser: 0.83 mg/dL (ref 0.40–1.50)
GFR: 91.19 mL/min (ref >60.00)
Glucose, Bld: 103 mg/dL — ABNORMAL HIGH (ref 70–99)
Potassium: 4.4 meq/L (ref 3.5–5.1)
Sodium: 137 meq/L (ref 135–145)
Total Bilirubin: 0.5 mg/dL (ref 0.2–1.2)
Total Protein: 6.9 g/dL (ref 6.0–8.3)

## 2024-05-16 LAB — POCT GLYCOSYLATED HEMOGLOBIN (HGB A1C): Hemoglobin A1C: 6.8 % — AB (ref 4.0–5.6)

## 2024-05-16 LAB — MICROALBUMIN / CREATININE URINE RATIO
Creatinine,U: 34.3 mg/dL
Microalb Creat Ratio: UNDETERMINED mg/g (ref 0.0–30.0)
Microalb, Ur: <0.7 mg/dL

## 2024-05-16 LAB — CBC WITH DIFFERENTIAL/PLATELET
Basophils Absolute: 0 10*3/uL (ref 0.0–0.1)
Basophils Relative: 0.3 % (ref 0.0–3.0)
Eosinophils Absolute: 0.1 10*3/uL (ref 0.0–0.7)
Eosinophils Relative: 2 % (ref 0.0–5.0)
HCT: 39.8 % (ref 39.0–52.0)
Hemoglobin: 13.6 g/dL (ref 13.0–17.0)
Lymphocytes Relative: 30.2 % (ref 12.0–46.0)
Lymphs Abs: 1.7 10*3/uL (ref 0.7–4.0)
MCHC: 34.1 g/dL (ref 30.0–36.0)
MCV: 87.8 fl (ref 78.0–100.0)
Monocytes Absolute: 0.5 10*3/uL (ref 0.1–1.0)
Monocytes Relative: 8.9 % (ref 3.0–12.0)
Neutro Abs: 3.3 10*3/uL (ref 1.4–7.7)
Neutrophils Relative %: 58.6 % (ref 43.0–77.0)
Platelets: 186 10*3/uL (ref 150.0–400.0)
RBC: 4.53 Mil/uL (ref 4.22–5.81)
RDW: 13.6 % (ref 11.5–15.5)
WBC: 5.7 10*3/uL (ref 4.0–10.5)

## 2024-05-16 LAB — LIPID PANEL
Cholesterol: 115 mg/dL (ref 0–200)
HDL: 32.9 mg/dL — ABNORMAL LOW (ref >39.00)
LDL Cholesterol: 46 mg/dL (ref 0–99)
NonHDL: 81.6
Total CHOL/HDL Ratio: 3
Triglycerides: 177 mg/dL — ABNORMAL HIGH (ref 0.0–149.0)
VLDL: 35.4 mg/dL (ref 0.0–40.0)

## 2024-05-16 MED ORDER — METFORMIN HCL 500 MG PO TABS: ORAL_TABLET | ORAL | 3 refills | Status: AC

## 2024-05-16 NOTE — Progress Notes (Signed)
 Phone [phone] In person visit   Subjective:   Darrell Gaines is a 68 y.o. year old very pleasant male patient who presents for/with See problem oriented charting Chief Complaint  Patient presents with   Diabetes    40mo f/u; have eye exam scheduled in June   Annual Exam    Want to discuss sleep issues; may have sleep apnea    Past Medical History-  Patient Active Problem List   Diagnosis Date Noted   Subclavian artery stenosis, right (HCC) 05/04/2018    Priority: High   Type II diabetes mellitus, well controlled (HCC) 12/11/2014    Priority: High   History of CVA (cerebrovascular accident) 07/26/2007    Priority: High   BPH associated with nocturia 04/24/2016    Priority: Medium    Depression 08/21/2008    Priority: Medium    Hyperlipidemia 07/26/2007    Priority: Medium    Chondromalacia 11/05/2017    Priority: Low   History of adenomatous polyp of colon 04/24/2016    Priority: Low   GERD 07/26/2007    Priority: Low   Recurrent major depressive disorder, in full remission (HCC) 05/16/2022   Visual disturbance 01/07/2018    Medications- reviewed and updated Current Outpatient Medications  Medication Sig Dispense Refill   aspirin 325 MG EC tablet Take 325 mg by mouth daily.     blood glucose meter kit and supplies KIT Dispense based on patient and insurance preference. Use up to four times daily as directed. 1 each 0   ciprofloxacin -dexamethasone  (CIPRODEX ) OTIC suspension Place 4 drops into the left ear 2 (two) times daily. 7.5 mL 0   clopidogrel  (PLAVIX ) 75 MG tablet TAKE 1 TABLET(75 MG) BY MOUTH DAILY 90 tablet 3   famotidine  (PEPCID ) 20 MG tablet TAKE 1 TABLET BY MOUTH TWICE DAILY TO BE TAKEN WITH NSAID 180 tablet 3   ferrous sulfate 325 (65 FE) MG EC tablet Take 325 mg by mouth daily with breakfast.     fish oil-omega-3 fatty acids 1000 MG capsule Take 3 g by mouth daily.     Miconazole  Nitrate 2 % OINT two times per day for one to three weeks and repeated as  necessary for angular cheilitis 141 g 1   niacin 500 MG tablet Take 500 mg by mouth. 1 po at bedtime     PARoxetine  (PAXIL ) 10 MG tablet TAKE 1 TABLET(10 MG) BY MOUTH DAILY 90 tablet 3   rosuvastatin  (CRESTOR ) 40 MG tablet TAKE 1 TABLET(40 MG) BY MOUTH DAILY 90 tablet 3   tadalafil  (CIALIS ) 20 MG tablet Take 1 tablet (20 mg total) by mouth every 3 (three) days as needed for erectile dysfunction. 10 tablet 11   tamsulosin  (FLOMAX ) 0.4 MG CAPS capsule TAKE 1 CAPSULE(0.4 MG) BY MOUTH DAILY 90 capsule 3   metFORMIN  (GLUCOPHAGE ) 500 MG tablet TAKE 1 TABLET(500 MG) BY TWICE DAILY WITH BREAKFAST 180 tablet 3   No current facility-administered medications for this visit.     Objective:  BP 120/60   Pulse (!) 51   Temp (!) 97.5 F (36.4 C)   Ht 5\' 10"  (1.778 m)   Wt 195 lb (88.5 kg)   SpO2 98%   BMI 27.98 kg/m  Gen: NAD, resting comfortably CV: bradycardic but regular no murmurs rubs or gallops Lungs: CTAB no crackles, wheeze, rhonchi Ext: no edema Skin: warm, dry Neuro: grossly normal, moves all extremities  Diabetic foot exam was performed with the following findings:   No deformities, ulcerations, or  other skin breakdown Normal sensation of 10g monofilament Intact posterior tibialis and dorsalis pedis pulses        Assessment and Plan   #social update- son getting married in [place] and will be there 2 weeks then 2nd wedding here- will be at his house  #sleep concerns- thinks he is mouth breathing- wakes up with dry mouth and throat. Snores heavily at times. Still nods off in daytime. He is open to referral and placed today  #some thumb joint pain right at IP joint- he is left handed but has used guitar pick for many years in the right hand.  -May be worth trial 4x a day for 2 weeks Voltaren gel to see if this helps then take week off at least and could retrial playing  #History of stroke-patient remains on Plavix  75 mg and aspirin 325 mg per Dr. Shaunna [place] recommendations    #subclavian artery stenosis-remains on risk factor modification with statin, Plavix .  Originally presented with subclavian steal syndrome with vertigo and blurred vision- no recurrent issues.  Dr. Shaunna [place] in 2019 said prn follow up for concerns per patient.   # Diabetes S: Medication:Metformin  500 mg daily Exercise and diet- still walking 2.5 miles a day about twice a week and also doing pickleball and yardwork.  Trying to cook at home/reasonably healthy. Weight up 6 lbs from last visit Lab Results  Component Value Date   HGBA1C 6.8 (A) 05/16/2024   HGBA1C 6.3 11/17/2023   HGBA1C 6.4 05/12/2023  A/P: a1c up slightly- feels doing well with healthy eating and regular exercise - perhaps some maximization possible but already does pretty well. We are going to trial metformin  500 mg twice daily with mild increase in a1c to be proactie -upcoming eye exam in June  I discussed the microalbumin to creatinine lab error with patient that occurred with [facility].  Essentially the ratio was off by a factor of 10.  In this patient's individual case converting this would make 2.8 turn into 28 so still under goal    #hyperlipidemia/LFT elevation.  LDL goal under 70 with history of stroke S: Medication:Rosuvastatin  40 mg daily, fish oil, niacin.  Lab Results  Component Value Date   CHOL 125 05/12/2023   HDL 37.20 (L) 05/12/2023   LDLCALC 43 05/16/2022   LDLDIRECT 57.0 05/12/2023   TRIG 235.0 (H) 05/12/2023   CHOLHDL 3 05/12/2023   Lab Results  Component Value Date   ALT 37 11/17/2023   AST 26 11/17/2023   ALKPHOS 48 11/17/2023   BILITOT 0.6 11/17/2023  A/P: lipids reasonably controlled with LDL at 57 last check- due for repeat at this time- update today and continue current medications for now  -Patient with mild AST elevation which we will update with labs today  # Depression S: Medication:Paxil  10 mg trial 2023 down from 20 mg.  No anhedonia or depressed mood  A/P: reasonable  control/full remission- continue current medications  - prefers to hold steady  #BPH S:Nocturia up to 3  times a night even  on Flomax  last visit.   - in past We added low-dose finasteride  2.5 mg but did not help over 5 months and decreased libido A/P: ongoing issues but some improvement on medications- continue current medications   #Anemia/low ferritin in the past-patient is on iron.  Up-to-date on colonoscopy.  CBC today- ferritin normal in past  Tympanostomy tube 2017 in left ear- replaced in 2020 and 01/14/22 with Dr. Tellis Feathers- also hearing aids now through their office-  both ears #erectile dysfunction (ED) - Cialis  helpful and has not had any lightheadedness on this in combo with flomax   Recommended follow up: Return in about 6 months (around 11/16/2024) for physical or sooner if needed.Schedule b4 you leave. Future Appointments  Date Time Provider Department Center  08/01/2024  1:40 PM [facility] ANNUAL WELLNESS VISIT 1 [facility]    Lab/Order associations: FASTING   ICD-10-CM   1. Type II diabetes mellitus, well controlled (HCC)  E11.9 POCT glycosylated hemoglobin (Hb A1C)    Microalbumin / creatinine urine ratio    2. Snoring  R06.83 Ambulatory referral to Pulmonology    3. Subclavian artery stenosis, right (HCC)  I77.1     4. Hyperlipidemia, unspecified hyperlipidemia type  E78.5 Comprehensive metabolic panel with GFR    CBC with Differential/Platelet    Lipid panel      Meds ordered this encounter  Medications   metFORMIN  (GLUCOPHAGE ) 500 MG tablet    Sig: TAKE 1 TABLET(500 MG) BY TWICE DAILY WITH BREAKFAST    Dispense:  180 tablet    Refill:  3    Return precautions advised.  Clarisa Crooked, MD

## 2024-05-16 NOTE — Patient Instructions (Addendum)
 We have placed a referral for you today to pulmonary for sleep apnea evaluation - please call their # if you do not hear within a week (may be listed below or you may see mychart message within a few days with #).   May be worth trial 4x a day for 2 weeks Voltaren gel to see if this helps then take week off at least and could retrial playing  We are going to trial metformin  500 mg twice daily with mild increase in a1c to be proactive  Please stop by lab before you go If you have mychart- we will send your results within 3 business days of us  receiving them.  If you do not have mychart- we will call you about results within 5 business days of us  receiving them.  *please also note that you will see labs on mychart as soon as they post. I will later go in and write notes on them- will say "notes from Dr. Arlene Ben"   Recommended follow up: Return in about 6 months (around 11/16/2024) for physical or sooner if needed.Schedule b4 you leave.

## 2024-05-24 ENCOUNTER — Other Ambulatory Visit: Payer: Self-pay | Admitting: Family Medicine

## 2024-06-13 DIAGNOSIS — E119 Type 2 diabetes mellitus without complications: Secondary | ICD-10-CM | POA: Diagnosis not present

## 2024-06-13 LAB — HM DIABETES EYE EXAM

## 2024-07-07 ENCOUNTER — Ambulatory Visit (HOSPITAL_BASED_OUTPATIENT_CLINIC_OR_DEPARTMENT_OTHER): Admitting: Primary Care

## 2024-07-26 ENCOUNTER — Ambulatory Visit (INDEPENDENT_AMBULATORY_CARE_PROVIDER_SITE_OTHER): Admitting: Primary Care

## 2024-07-26 ENCOUNTER — Encounter (HOSPITAL_BASED_OUTPATIENT_CLINIC_OR_DEPARTMENT_OTHER): Payer: Self-pay | Admitting: Primary Care

## 2024-07-26 VITALS — BP 119/69 | HR 57 | Ht 70.0 in | Wt 188.0 lb

## 2024-07-26 DIAGNOSIS — R0683 Snoring: Secondary | ICD-10-CM | POA: Diagnosis not present

## 2024-07-26 NOTE — Progress Notes (Signed)
 @Patient  ID: Darrell Gaines, male    DOB: [birth year], 68 y.o.   MRN: [id number]  Chief Complaint  Patient presents with   Establish Care    Referring provider: Katrinka Garnette KIDD, MD  HPI: 68 year old male, never smoked. PMH significant for GERD, type 2 diabetes, hyperlipidemia, hx CVA.  07/26/2024 Discussed the use of AI scribe software for clinical note transcription with the patient, who gave verbal consent to proceed.  History of Present Illness   Darrell Gaines is a 68 year old male who presents with symptoms suggestive of sleep apnea.  He experiences waking up with a dry mouth and the back of his tongue pressed against the roof of his mouth. Snoring has been confirmed by others, and he has mild daytime sleepiness with an Epworth score of 7 out of 24. No episodes of waking up choking have occurred, and he has not undergone a sleep study before.  He wakes up multiple times at night, attributing this to an enlarged prostate requiring urination. He is taking Flomax  at night to manage this condition and limits fluid and caffeine intake before bed. His bedtime is at 11 PM, taking about 20 minutes to fall asleep, and he typically wakes up three times a night, starting his day at 9 AM.  He sleeps on his side. No history of cardiac arrhythmias, heart failure, COPD, or seizures is present. He experienced a mild stroke approximately 35-36 years ago and is on a blood thinner. No history of narcolepsy or sudden sleep attacks without warning.      No Known Allergies  Immunization History  Administered Date(s) Administered   Influenza Whole 10/17/2009, 09/28/2012   Influenza, High Dose Seasonal PF 10/02/2022, 10/13/2023   Influenza,inj,Quad PF,6+ Mos 09/13/2019, 10/12/2021   Influenza-Unspecified 10/12/2014, 10/09/2016, 09/21/2017, 10/13/2018, 10/10/2020   PFIZER Comirnaty(Gray Top)Covid-19 Tri-Sucrose Vaccine 10/02/2022   PFIZER(Purple Top)SARS-COV-2 Vaccination 03/15/2020, 04/09/2020,  10/13/2020, 05/11/2021   PNEUMOCOCCAL CONJUGATE-20 05/12/2023   Pfizer Covid-19 Vaccine Bivalent Booster 45yrs & up 10/12/2021   Pfizer(Comirnaty)Fall Seasonal Vaccine 12 years and older 10/13/2023   Pneumococcal Polysaccharide-23 11/15/2018   Respiratory Syncytial Virus Vaccine,Recomb Aduvanted(Arexvy) 10/02/2022   Td 12/29/1997, 01/27/2008   Tdap 05/04/2018   Zoster Recombinant(Shingrix) 12/11/2022, 04/03/2023    Past Medical History:  Diagnosis Date   Allergy    Anxiety    Arthritis    Depression (disease)    Diabetes mellitus (HCC)    Erosive esophagitis    GERD (gastroesophageal reflux disease)    Hemorrhoids    History of cerebrovascular accident    Hyperlipidemia    Peripheral vascular disease (HCC)    Stroke (HCC)    2001   Subclavian artery stenosis, right (HCC)    Tubular adenoma polyp of rectum 07/2010    Tobacco History: Social History   Tobacco Use  Smoking Status Never  Smokeless Tobacco Never   Counseling given: Not Answered   Outpatient Medications Prior to Visit  Medication Sig Dispense Refill   aspirin 325 MG EC tablet Take 325 mg by mouth daily.     blood glucose meter kit and supplies KIT Dispense based on patient and insurance preference. Use up to four times daily as directed. 1 each 0   ciprofloxacin -dexamethasone  (CIPRODEX ) OTIC suspension Place 4 drops into the left ear 2 (two) times daily. 7.5 mL 0   clopidogrel  (PLAVIX ) 75 MG tablet TAKE 1 TABLET(75 MG) BY MOUTH DAILY 90 tablet 3   famotidine  (PEPCID ) 20 MG tablet TAKE 1 TABLET  BY MOUTH TWICE DAILY TO BE TAKEN WITH NSAID 180 tablet 3   ferrous sulfate 325 (65 FE) MG EC tablet Take 325 mg by mouth daily with breakfast.     fish oil-omega-3 fatty acids 1000 MG capsule Take 3 g by mouth daily.     metFORMIN  (GLUCOPHAGE ) 500 MG tablet TAKE 1 TABLET(500 MG) BY TWICE DAILY WITH BREAKFAST 180 tablet 3   Miconazole  Nitrate 2 % OINT two times per day for one to three weeks and repeated as necessary  for angular cheilitis 141 g 1   niacin 500 MG tablet Take 500 mg by mouth. 1 po at bedtime     PARoxetine  (PAXIL ) 10 MG tablet TAKE 1 TABLET(10 MG) BY MOUTH DAILY 90 tablet 3   rosuvastatin  (CRESTOR ) 40 MG tablet TAKE 1 TABLET(40 MG) BY MOUTH DAILY 90 tablet 3   tadalafil  (CIALIS ) 20 MG tablet Take 1 tablet (20 mg total) by mouth every 3 (three) days as needed for erectile dysfunction. 10 tablet 11   tamsulosin  (FLOMAX ) 0.4 MG CAPS capsule TAKE 1 CAPSULE(0.4 MG) BY MOUTH DAILY 90 capsule 3   No facility-administered medications prior to visit.   Review of Systems  Review of Systems  Constitutional:  Positive for fatigue.  HENT: Negative.    Respiratory: Negative.    Psychiatric/Behavioral:  Positive for sleep disturbance.    Physical Exam  BP 119/69 (BP Location: Left Arm, Patient Position: Sitting, Cuff Size: Large)   Pulse (!) 57   Ht 5' 10 (1.778 m)   Wt 188 lb (85.3 kg)   SpO2 96%   BMI 26.98 kg/m  Physical Exam Constitutional:      Appearance: Normal appearance.  HENT:     Head: Normocephalic and atraumatic.  Cardiovascular:     Rate and Rhythm: Normal rate and regular rhythm.  Pulmonary:     Effort: Pulmonary effort is normal.     Breath sounds: Normal breath sounds.  Musculoskeletal:        General: Normal range of motion.  Skin:    General: Skin is warm and dry.  Neurological:     General: No focal deficit present.     Mental Status: He is alert and oriented to person, place, and time. Mental status is at baseline.  Psychiatric:        Mood and Affect: Mood normal.        Behavior: Behavior normal.        Thought Content: Thought content normal.        Judgment: Judgment normal.      Lab Results:  CBC    Component Value Date/Time   WBC 5.7 05/16/2024 1125   RBC 4.53 05/16/2024 1125   HGB 13.6 05/16/2024 1125   HCT 39.8 05/16/2024 1125   PLT 186.0 05/16/2024 1125   MCV 87.8 05/16/2024 1125   MCH 30.9 11/13/2020 0827   MCHC 34.1 05/16/2024 1125    RDW 13.6 05/16/2024 1125   LYMPHSABS 1.7 05/16/2024 1125   MONOABS 0.5 05/16/2024 1125   EOSABS 0.1 05/16/2024 1125   BASOSABS 0.0 05/16/2024 1125    BMET    Component Value Date/Time   NA 137 05/16/2024 1125   K 4.4 05/16/2024 1125   CL 104 05/16/2024 1125   CO2 26 05/16/2024 1125   GLUCOSE 103 (H) 05/16/2024 1125   GLUCOSE 107 (H) 01/05/2007 0844   BUN 23 05/16/2024 1125   CREATININE 0.83 05/16/2024 1125   CREATININE 1.11 11/13/2020 0827   CALCIUM  9.1  05/16/2024 1125   GFRNONAA 70 11/13/2020 0827   GFRAA 81 11/13/2020 0827    BNP No results found for: BNP  ProBNP No results found for: PROBNP  Imaging: No results found.   Assessment & Plan:   1. Loud snoring (Primary) - Home sleep test; Future  Assessment and Plan    Suspected obstructive sleep apnea Suspected obstructive sleep apnea based on symptoms of snoring, dry mouth, and mild daytime sleepiness. Epworth score of 7/24. No history of choking during sleep. No prior sleep study conducted. Discussed potential risks of untreated sleep apnea, including cardiac arrhythmia, pulmonary hypertension, diabetes, and possible link to Alzheimer's. Explained that mild sleep apnea may not require CPAP and can be managed with lifestyle changes such as side sleeping and avoiding sedatives or alcohol before bed. Discussed alternative treatments like oral appliances, which are typically out-of-pocket expenses, and surgical options, which are not first-line treatments. Mentioned Inspire device for moderate to severe cases intolerant to CPAP.  - Order home sleep study through [facility]. - Instruct to follow up via MyChart or schedule a virtual visit to discuss results. - Discuss CPAP and other treatment options if sleep study is positive.  Almarie LELON Ferrari, NP 07/26/2024

## 2024-07-26 NOTE — Progress Notes (Signed)
 Epworth Sleepiness Scale  Use the following scale to choose the most appropriate number for each situation. 0 Would never nod off 1  Slight  chance of nodding off 2 Moderate chance of nodding off 3 High chance of nodding off  Sitting and reading: 2 Watching TV: 1 Sitting, inactive, in a public place (e.g., in a meeting, theater, or dinner event): 0 As a passenger in a car for an hour or more without stopping for a break: 0 Lying down to rest when circumstances permit:2 Sitting and talking to someone: 0 Sitting quietly after a meal without alcohol: 1 In a car, while stopped for a few  minutes in traffic or at a light: 0  TOTOAL: 6

## 2024-07-26 NOTE — Patient Instructions (Addendum)
  VISIT SUMMARY: Today, you were seen for symptoms that suggest you might have sleep apnea. You reported waking up with a dry mouth, snoring, and mild daytime sleepiness. You also mentioned waking up multiple times at night, which you believe is due to an enlarged prostate. We discussed the potential risks of untreated sleep apnea and the various treatment options available.  YOUR PLAN: -SUSPECTED OBSTRUCTIVE SLEEP APNEA: Obstructive sleep apnea is a condition where your airway becomes blocked during sleep, causing breathing pauses and often leading to snoring and daytime sleepiness. We discussed that mild cases might not need a CPAP machine and can be managed with lifestyle changes like sleeping on your side and avoiding sedatives or alcohol before bed. We also talked about other treatments like oral appliances and surgical options, though these are not first-line treatments. A home sleep study has been ordered to confirm the diagnosis.  INSTRUCTIONS: Please complete the home sleep study through [facility] as ordered. Follow up via MyChart or schedule a virtual visit to discuss the results. If the sleep study is positive, we will discuss CPAP and other treatment options.  Orders: Home sleep study via [facility]   Follow-up: 3-4 months with APP in [facility], can cancel if sleep study is negative

## 2024-07-27 ENCOUNTER — Other Ambulatory Visit: Payer: Self-pay | Admitting: Family Medicine

## 2024-08-01 ENCOUNTER — Ambulatory Visit

## 2024-08-03 DIAGNOSIS — G4730 Sleep apnea, unspecified: Secondary | ICD-10-CM | POA: Diagnosis not present

## 2024-08-18 ENCOUNTER — Other Ambulatory Visit: Payer: Self-pay | Admitting: Family Medicine

## 2024-08-22 ENCOUNTER — Telehealth: Payer: Self-pay | Admitting: Pulmonary Disease

## 2024-08-22 NOTE — Telephone Encounter (Signed)
 ATC x1. Left detailed vm per DPR with results.

## 2024-08-22 NOTE — Telephone Encounter (Signed)
 Sleep study was negative for sleep apnea, if symptoms persistent recommend repeating in lab  Focus on weight loss, side sleeping position, avoiding alcohol before bedtime or driving while tired

## 2024-08-22 NOTE — Telephone Encounter (Signed)
 Call patient  Sleep study result  Date of study: 08/03/2024  Impression: Negative study for significant sleep disordered breathing with an AHI of 1.3, O2 nadir of 89%  Recommendation: Consider an in lab study if there remains significant clinical concern for sleep disordered breathing Current study does not suggest the presence of moderate to severe sleep disordered breathing Encourage behavioral changes to optimize sleep duration, quality, and quantity

## 2024-10-04 DIAGNOSIS — Z9622 Myringotomy tube(s) status: Secondary | ICD-10-CM | POA: Diagnosis not present

## 2024-10-04 DIAGNOSIS — H9212 Otorrhea, left ear: Secondary | ICD-10-CM | POA: Diagnosis not present

## 2024-10-05 DIAGNOSIS — K080 Exfoliation of teeth due to systemic causes: Secondary | ICD-10-CM | POA: Diagnosis not present

## 2024-10-11 ENCOUNTER — Other Ambulatory Visit: Payer: Self-pay | Admitting: Family Medicine

## 2024-10-26 ENCOUNTER — Ambulatory Visit (HOSPITAL_BASED_OUTPATIENT_CLINIC_OR_DEPARTMENT_OTHER)

## 2024-11-02 ENCOUNTER — Other Ambulatory Visit: Payer: Self-pay | Admitting: Family Medicine

## 2024-11-07 ENCOUNTER — Ambulatory Visit

## 2024-11-07 VITALS — Ht 70.0 in | Wt 188.0 lb

## 2024-11-07 DIAGNOSIS — Z0000 Encounter for general adult medical examination without abnormal findings: Secondary | ICD-10-CM

## 2024-11-07 NOTE — Patient Instructions (Signed)
 Darrell Gaines,  Thank you for taking the time for your Medicare Wellness Visit. I appreciate your continued commitment to your health goals. Please review the care plan we discussed, and feel free to reach out if I can assist you further.  Please note that Annual Wellness Visits do not include a physical exam. Some assessments may be limited, especially if the visit was conducted virtually. If needed, we may recommend an in-person follow-up with your provider.  Ongoing Care Seeing your primary care provider every 3 to 6 months helps us  monitor your health and provide consistent, personalized care.    Referrals If a referral was made during today's visit and you haven't received any updates within two weeks, please contact the referred provider directly to check on the status.  Recommended Screenings:  Health Maintenance  Topic Date Due   Medicare Annual Wellness Visit  05/11/2024   Flu Shot  07/29/2024   COVID-19 Vaccine (8 - 2025-26 season) 08/29/2024   Hemoglobin A1C  11/16/2024   Yearly kidney function blood test for diabetes  05/16/2025   Yearly kidney health urinalysis for diabetes  05/16/2025   Complete foot exam   05/16/2025   Eye exam for diabetics  06/13/2025   DTaP/Tdap/Td vaccine (4 - Td or Tdap) 05/04/2028   Colon Cancer Screening  03/09/2033   Pneumococcal Vaccine for age over 54  Completed   Hepatitis C Screening  Completed   Zoster (Shingles) Vaccine  Completed   Meningitis B Vaccine  Aged Out       06/09/2018    1:15 PM  Advanced Directives  Does Patient Have a Medical Advance Directive? Yes   Type of Estate Agent of [place];Living will  Copy of Healthcare Power of Attorney in Chart? No - copy requested   Would patient like information on creating a medical advance directive? No - Patient declined      Data saved with a previous flowsheet row definition    Vision: Annual vision screenings are recommended for early detection of glaucoma,  cataracts, and diabetic retinopathy. These exams can also reveal signs of chronic conditions such as diabetes and high blood pressure.  Dental: Annual dental screenings help detect early signs of oral cancer, gum disease, and other conditions linked to overall health, including heart disease and diabetes.  Please see the attached documents for additional preventive care recommendations.

## 2024-11-07 NOTE — Progress Notes (Addendum)
 Subjective:   Darrell Gaines is a 68 y.o. male who presents for a Medicare Annual Wellness Visit.  Allergies (verified) Patient has no known allergies.   History: Past Medical History:  Diagnosis Date   Allergy    Anxiety    Arthritis    Depression (disease)    Diabetes mellitus (HCC)    Erosive esophagitis    GERD (gastroesophageal reflux disease)    Hemorrhoids    History of cerebrovascular accident    Hyperlipidemia    Peripheral vascular disease    Stroke [facility] [place])    2001   Subclavian artery stenosis, right    Tubular adenoma polyp of rectum 07/2010   Past Surgical History:  Procedure Laterality Date   APPENDECTOMY  1967   colon polyp removal  12/29/1965   COLON SURGERY  1967   Polp removal   COLONOSCOPY     Family History  Problem Relation Age of Onset   Parkinson's disease Mother    Coronary artery disease Father    Heart attack Father 51       infection related potentially   Heart disease Father    Heart disease Maternal Grandfather        unknown age, MI at some age   Heart attack Maternal Grandfather    Diabetes Maternal Grandmother    Colon cancer Neg Hx    Esophageal cancer Neg Hx    Stomach cancer Neg Hx    Rectal cancer Neg Hx    Social History   Occupational History   Occupation: retired  Tobacco Use   Smoking status: Never   Smokeless tobacco: Never  Vaping Use   Vaping status: Never Used  Substance and Sexual Activity   Alcohol use: Yes    Alcohol/week: 0.0 standard drinks of alcohol    Comment: Occasional   Drug use: No   Sexual activity: Yes    Comment: My wife is post-menopausal.   Tobacco Counseling Counseling given: Not Answered  SDOH Screenings   Food Insecurity: No Food Insecurity (11/03/2024)  Housing: Low Risk  (11/03/2024)  Transportation Needs: No Transportation Needs (11/03/2024)  Utilities: Not At Risk (11/07/2024)  Alcohol Screen: Low Risk  (11/03/2024)  Depression (PHQ2-9): Low Risk  (11/17/2023)  Financial  Resource Strain: Low Risk  (11/03/2024)  Physical Activity: Sufficiently Active (11/03/2024)  Social Connections: Unknown (11/03/2024)  Stress: No Stress Concern Present (11/03/2024)  Tobacco Use: Low Risk  (11/07/2024)  Health Literacy: Adequate Health Literacy (11/07/2024)   Depression Screen    11/17/2023    2:25 PM 05/12/2023   12:59 PM 11/11/2022    8:24 AM 05/16/2022    8:03 AM 11/18/2021    8:05 AM 05/15/2021    8:13 AM 11/13/2020    8:03 AM  PHQ 2/9 Scores  PHQ - 2 Score 1 0 0 0 0 2 0  PHQ- 9 Score 3  0  0  0  0  6  0      Data saved with a previous flowsheet row definition     Goals Addressed               This Visit's Progress     eat right and exercise (pt-stated)        Eat right and exercise        Visit info / Clinical Intake: Medicare Wellness Visit Type:: Initial Annual Wellness Visit Persons participating in visit:: patient Medicare Wellness Visit Mode:: Telephone If telephone:: video declined Because this visit was  a virtual/telehealth visit:: unable to obtan vitals due to lack of equipment If Telephone or Video please confirm:: I connected with the patient using audio enabled telemedicine application and verified that I am speaking with the correct person using two identifiers Patient Location:: home Provider Location:: in office Information given by:: patient Interpreter Needed?: No Pre-visit prep was completed: yes AWV questionnaire completed by patient prior to visit?: yes Date:: 11/03/24 Living arrangements:: lives with spouse/significant other Patient's Overall Health Status Rating: very good Typical amount of pain: none Does pain affect daily life?: no Are you currently prescribed opioids?: no  Dietary Habits and Nutritional Risks How many meals a day?: 3 Eats fruit and vegetables daily?: yes Most meals are obtained by: preparing own meals Diabetic:: (!) yes Any non-healing wounds?: no How often do you check your BS?: as needed Would  you like to be referred to a Nutritionist or for Diabetic Management? : no  Functional Status Activities of Daily Living (to include ambulation/medication): (Patient-Rptd) Independent Ambulation: Independent with device- listed below Home Assistive Devices/Equipment: Eyeglasses Medication Administration: Independent Home Management: (Patient-Rptd) Independent Manage your own finances?: yes Primary transportation is: driving Concerns about vision?: no *vision screening is required for WTM* Concerns about hearing?: (!) yes Uses hearing aids?: (!) yes Hear whispered voice?: yes  Fall Screening Falls in the past year?: (Patient-Rptd) 0 Number of falls in past year: (Patient-Rptd) 0 Was there an injury with Fall?: (Patient-Rptd) 0 Fall Risk Category Calculator: (Patient-Rptd) 0 Patient Fall Risk Level: (Patient-Rptd) Low Fall Risk  Fall Risk Patient at Risk for Falls Due to: No Fall Risks Fall risk Follow up: Falls prevention discussed  Home and Transportation Safety: All rugs have non-skid backing?: yes All stairs or steps have railings?: N/A, no stairs Grab bars in the bathtub or shower?: (!) no Have non-skid surface in bathtub or shower?: (!) no Good home lighting?: yes Regular seat belt use?: yes Hospital stays in the last year:: no  Cognitive Assessment Difficulty concentrating, remembering, or making decisions? : no Will 6CIT or Mini Cog be Completed: no 6CIT or Mini Cog Declined: patient alert, oriented, able to answer questions appropriately and recall recent events  Advance Directives (For Healthcare) Does Patient Have a Medical Advance Directive?: Yes Type of Advance Directive: Healthcare Power of Attorney Copy of Healthcare Power of Attorney in Chart?: No - copy requested  Reviewed/Updated  Reviewed/Updated: Reviewed All (Medical, Surgical, Family, Medications, Allergies, Care Teams, Patient Goals)        Objective:    Today's Vitals   11/07/24 1430   Weight: 188 lb (85.3 kg)  Height: 5' 10 (1.778 m)   Body mass index is 26.98 kg/m.  Current Medications (verified) Outpatient Encounter Medications as of 11/07/2024  Medication Sig   aspirin 325 MG EC tablet Take 325 mg by mouth daily.   blood glucose meter kit and supplies KIT Dispense based on patient and insurance preference. Use up to four times daily as directed.   clopidogrel  (PLAVIX ) 75 MG tablet TAKE 1 TABLET(75 MG) BY MOUTH DAILY   famotidine  (PEPCID ) 20 MG tablet TAKE 1 TABLET BY MOUTH TWICE DAILY TO BE TAKEN WITH NSAID   ferrous sulfate 325 (65 FE) MG EC tablet Take 325 mg by mouth daily with breakfast.   fish oil-omega-3 fatty acids 1000 MG capsule Take 3 g by mouth daily.   metFORMIN  (GLUCOPHAGE ) 500 MG tablet TAKE 1 TABLET(500 MG) BY TWICE DAILY WITH BREAKFAST   Miconazole  Nitrate 2 % OINT two times per day  for one to three weeks and repeated as necessary for angular cheilitis   niacin 500 MG tablet Take 500 mg by mouth. 1 po at bedtime   PARoxetine  (PAXIL ) 10 MG tablet TAKE 1 TABLET(10 MG) BY MOUTH DAILY   rosuvastatin  (CRESTOR ) 40 MG tablet TAKE 1 TABLET(40 MG) BY MOUTH DAILY   tadalafil  (CIALIS ) 20 MG tablet TAKE 1 TABLET(20 MG) BY MOUTH EVERY 3 DAYS AS NEEDED FOR ERECTILE DYSFUNCTION   tamsulosin  (FLOMAX ) 0.4 MG CAPS capsule TAKE 1 CAPSULE(0.4 MG) BY MOUTH DAILY   ciprofloxacin -dexamethasone  (CIPRODEX ) OTIC suspension Place 4 drops into the left ear 2 (two) times daily. (Patient not taking: Reported on 11/07/2024)   No facility-administered encounter medications on file as of 11/07/2024.   Hearing/Vision screen No results found. Immunizations and Health Maintenance Health Maintenance  Topic Date Due   COVID-19 Vaccine (8 - 2025-26 season) 08/29/2024   HEMOGLOBIN A1C  11/16/2024   Diabetic kidney evaluation - eGFR measurement  05/16/2025   Diabetic kidney evaluation - Urine ACR  05/16/2025   FOOT EXAM  05/16/2025   OPHTHALMOLOGY EXAM  06/13/2025   Medicare  Annual Wellness (AWV)  11/07/2025   DTaP/Tdap/Td (4 - Td or Tdap) 05/04/2028   Colonoscopy  03/09/2033   Pneumococcal Vaccine: 50+ Years  Completed   Influenza Vaccine  Completed   Hepatitis C Screening  Completed   Zoster Vaccines- Shingrix  Completed   Meningococcal B Vaccine  Aged Out        Assessment/Plan:  This is a routine wellness examination for Bj's.  Patient Care Team: Katrinka Garnette KIDD, MD as PCP - General (Family Medicine)  I have personally reviewed and noted the following in the patient's chart:   Medical and social history Use of alcohol, tobacco or illicit drugs  Current medications and supplements including opioid prescriptions. Functional ability and status Nutritional status Physical activity Advanced directives List of other physicians Hospitalizations, surgeries, and ER visits in previous 12 months Vitals Screenings to include cognitive, depression, and falls Referrals and appointments  No orders of the defined types were placed in this encounter.  In addition, I have reviewed and discussed with patient certain preventive protocols, quality metrics, and best practice recommendations. A written personalized care plan for preventive services as well as general preventive health recommendations were provided to patient.   Ellouise VEAR Haws, LPN   [id number]/89/[id number]   Return in 1 year (on 11/07/2025).  After Visit Summary: (MyChart) Due to this being a telephonic visit, the after visit summary with patients personalized plan was offered to patient via MyChart   Nurse Notes: nothing significant at this time

## 2024-11-08 DIAGNOSIS — H7292 Unspecified perforation of tympanic membrane, left ear: Secondary | ICD-10-CM | POA: Diagnosis not present

## 2024-11-08 DIAGNOSIS — H90A32 Mixed conductive and sensorineural hearing loss, unilateral, left ear with restricted hearing on the contralateral side: Secondary | ICD-10-CM | POA: Diagnosis not present

## 2024-11-08 DIAGNOSIS — H90A21 Sensorineural hearing loss, unilateral, right ear, with restricted hearing on the contralateral side: Secondary | ICD-10-CM | POA: Diagnosis not present

## 2024-11-17 ENCOUNTER — Ambulatory Visit: Payer: Self-pay | Admitting: Family Medicine

## 2024-11-17 ENCOUNTER — Encounter: Payer: Self-pay | Admitting: Family Medicine

## 2024-11-17 ENCOUNTER — Ambulatory Visit: Admitting: Family Medicine

## 2024-11-17 VITALS — BP 138/70 | HR 52 | Temp 98.0°F | Ht 70.0 in | Wt 193.0 lb

## 2024-11-17 DIAGNOSIS — M25511 Pain in right shoulder: Secondary | ICD-10-CM

## 2024-11-17 DIAGNOSIS — E785 Hyperlipidemia, unspecified: Secondary | ICD-10-CM | POA: Diagnosis not present

## 2024-11-17 DIAGNOSIS — Z125 Encounter for screening for malignant neoplasm of prostate: Secondary | ICD-10-CM

## 2024-11-17 DIAGNOSIS — E119 Type 2 diabetes mellitus without complications: Secondary | ICD-10-CM

## 2024-11-17 DIAGNOSIS — Z0000 Encounter for general adult medical examination without abnormal findings: Secondary | ICD-10-CM | POA: Diagnosis not present

## 2024-11-17 DIAGNOSIS — Z7984 Long term (current) use of oral hypoglycemic drugs: Secondary | ICD-10-CM

## 2024-11-17 LAB — COMPREHENSIVE METABOLIC PANEL WITH GFR
ALT: 38 U/L (ref 0–53)
AST: 27 U/L (ref 0–37)
Albumin: 4.5 g/dL (ref 3.5–5.2)
Alkaline Phosphatase: 42 U/L (ref 39–117)
BUN: 16 mg/dL (ref 6–23)
CO2: 28 meq/L (ref 19–32)
Calcium: 9.3 mg/dL (ref 8.4–10.5)
Chloride: 101 meq/L (ref 96–112)
Creatinine, Ser: 0.88 mg/dL (ref 0.40–1.50)
GFR: 89.27 mL/min (ref >60.00)
Glucose, Bld: 101 mg/dL — ABNORMAL HIGH (ref 70–99)
Potassium: 4.3 meq/L (ref 3.5–5.1)
Sodium: 137 meq/L (ref 135–145)
Total Bilirubin: 0.6 mg/dL (ref 0.2–1.2)
Total Protein: 6.7 g/dL (ref 6.0–8.3)

## 2024-11-17 LAB — CBC WITH DIFFERENTIAL/PLATELET
Basophils Absolute: 0 K/uL (ref 0.0–0.1)
Basophils Relative: 0.3 % (ref 0.0–3.0)
Eosinophils Absolute: 0.1 K/uL (ref 0.0–0.7)
Eosinophils Relative: 1.8 % (ref 0.0–5.0)
HCT: 40.9 % (ref 39.0–52.0)
Hemoglobin: 13.8 g/dL (ref 13.0–17.0)
Lymphocytes Relative: 28.1 % (ref 12.0–46.0)
Lymphs Abs: 1.5 K/uL (ref 0.7–4.0)
MCHC: 33.8 g/dL (ref 30.0–36.0)
MCV: 88.2 fl (ref 78.0–100.0)
Monocytes Absolute: 0.4 K/uL (ref 0.1–1.0)
Monocytes Relative: 8.5 % (ref 3.0–12.0)
Neutro Abs: 3.2 K/uL (ref 1.4–7.7)
Neutrophils Relative %: 61.3 % (ref 43.0–77.0)
Platelets: 176 K/uL (ref 150.0–400.0)
RBC: 4.63 Mil/uL (ref 4.22–5.81)
RDW: 13.4 % (ref 11.5–15.5)
WBC: 5.2 K/uL (ref 4.0–10.5)

## 2024-11-17 LAB — HEMOGLOBIN A1C: Hgb A1c MFr Bld: 6.1 % (ref 4.6–6.5)

## 2024-11-17 LAB — LIPID PANEL
Cholesterol: 110 mg/dL (ref 0–200)
HDL: 34 mg/dL — ABNORMAL LOW (ref >39.00)
LDL Cholesterol: 39 mg/dL (ref 0–99)
NonHDL: 76
Total CHOL/HDL Ratio: 3
Triglycerides: 183 mg/dL — ABNORMAL HIGH (ref 0.0–149.0)
VLDL: 36.6 mg/dL (ref 0.0–40.0)

## 2024-11-17 LAB — PSA, MEDICARE: PSA: 2.3 ng/mL (ref 0.10–4.00)

## 2024-11-17 NOTE — Progress Notes (Signed)
 Phone: [phone]   Subjective:  Patient presents today for their annual physical. Chief complaint-noted.   See problem oriented charting- ROS- full  review of systems was completed and negative  except for topics noted under acute/chronic concerns  The following were reviewed and entered/updated in epic: Past Medical History:  Diagnosis Date   Allergy    Anxiety    Arthritis    Depression (disease)    Diabetes mellitus (HCC)    Erosive esophagitis    GERD (gastroesophageal reflux disease)    Hemorrhoids    History of cerebrovascular accident    Hyperlipidemia    Peripheral vascular disease    Stroke [facility])    2001   Subclavian artery stenosis, right    Tubular adenoma polyp of rectum 07/2010   Patient Active Problem List   Diagnosis Date Noted   Subclavian artery stenosis, right 05/04/2018    Priority: High   Type II diabetes mellitus, well controlled (HCC) 12/11/2014    Priority: High   History of CVA (cerebrovascular accident) 07/26/2007    Priority: High   BPH associated with nocturia 04/24/2016    Priority: Medium    Depression 08/21/2008    Priority: Medium    Hyperlipidemia 07/26/2007    Priority: Medium    Chondromalacia 11/05/2017    Priority: Low   History of adenomatous polyp of colon 04/24/2016    Priority: Low   GERD 07/26/2007    Priority: Low   Recurrent major depressive disorder, in full remission 05/16/2022   Visual disturbance 01/07/2018   Past Surgical History:  Procedure Laterality Date   APPENDECTOMY  1967   colon polyp removal  12/29/1965   COLON SURGERY  1967   Polp removal   COLONOSCOPY      Family History  Problem Relation Age of Onset   Parkinson's disease Mother    Coronary artery disease Father    Heart attack Father 74       infection related potentially   Heart disease Father    Heart disease Maternal Grandfather        unknown age, MI at some age   Heart attack Maternal Grandfather    Diabetes Maternal Grandmother     Colon cancer Neg Hx    Esophageal cancer Neg Hx    Stomach cancer Neg Hx    Rectal cancer Neg Hx     Medications- reviewed and updated Current Outpatient Medications  Medication Sig Dispense Refill   aspirin 325 MG EC tablet Take 325 mg by mouth daily.     blood glucose meter kit and supplies KIT Dispense based on patient and insurance preference. Use up to four times daily as directed. 1 each 0   clopidogrel  (PLAVIX ) 75 MG tablet TAKE 1 TABLET(75 MG) BY MOUTH DAILY 90 tablet 3   famotidine  (PEPCID ) 20 MG tablet TAKE 1 TABLET BY MOUTH TWICE DAILY TO BE TAKEN WITH NSAID 180 tablet 3   ferrous sulfate 325 (65 FE) MG EC tablet Take 325 mg by mouth daily with breakfast.     fish oil-omega-3 fatty acids 1000 MG capsule Take 3 g by mouth daily.     metFORMIN  (GLUCOPHAGE ) 500 MG tablet TAKE 1 TABLET(500 MG) BY TWICE DAILY WITH BREAKFAST 180 tablet 3   Miconazole  Nitrate 2 % OINT two times per day for one to three weeks and repeated as necessary for angular cheilitis 141 g 1   niacin 500 MG tablet Take 500 mg by mouth. 1 po at bedtime  PARoxetine  (PAXIL ) 10 MG tablet TAKE 1 TABLET(10 MG) BY MOUTH DAILY 90 tablet 3   rosuvastatin  (CRESTOR ) 40 MG tablet TAKE 1 TABLET(40 MG) BY MOUTH DAILY 90 tablet 3   tadalafil  (CIALIS ) 20 MG tablet TAKE 1 TABLET(20 MG) BY MOUTH EVERY 3 DAYS AS NEEDED FOR ERECTILE DYSFUNCTION 10 tablet 11   tamsulosin  (FLOMAX ) 0.4 MG CAPS capsule TAKE 1 CAPSULE(0.4 MG) BY MOUTH DAILY 90 capsule 3   ciprofloxacin -dexamethasone  (CIPRODEX ) OTIC suspension Place 4 drops into the left ear 2 (two) times daily. (Patient not taking: Reported on 11/17/2024) 7.5 mL 0   No current facility-administered medications for this visit.    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 Oropharynx normal. Some wax in bilaterally ear canal- not able to see tympanic membrane perforation from prior tube Neck: no thyromegaly CV: RRR no murmurs rubs or gallops Lungs: CTAB no crackles, wheeze, rhonchi Abdomen: soft/nontender/nondistended/normal bowel sounds. No rebound or guarding.  Ext: no edema Skin: warm, dry Neuro: grossly normal, moves all extremities, PERRLA   Assessment and Plan  68 y.o. male presenting for annual physical.  Health Maintenance counseling: 1. Anticipatory guidance: Patient counseled regarding regular dental exams -q6 months, eye exams -yearly,  avoiding smoking and second hand smoke- none , limiting alcohol to 2 beverages per day -none, no illicit drugs .   2. Risk factor reduction:  Advised patient of need for regular exercise and diet rich and fruits and vegetables to reduce risk of heart attack and stroke.  Exercise- excellent- walking plus doing some pickleball - he's left handed so shoulder on right not getting in the way.  Diet/weight management-up 4 lbs from last year-tends to oscillate around 190.  Wt Readings from Last 3 Encounters:  11/17/24 193 lb (87.5 kg)  11/07/24 188 lb (85.3 kg)  07/26/24 188 lb (85.3 kg)   3. Immunizations/screenings/ancillary studies- up to date  Immunization History  Administered Date(s) Administered   INFLUENZA, HIGH DOSE SEASONAL PF 10/02/2022, 10/13/2023   Influenza  Whole 10/17/2009, 09/28/2012   Influenza,inj,Quad PF,6+ Mos 09/13/2019, 10/12/2021   Influenza-Unspecified 10/12/2014, 10/09/2016, 09/21/2017, 10/13/2018, 10/17/2024   PFIZER Comirnaty(Gray Top)Covid-19 Tri-Sucrose Vaccine 10/02/2022   PFIZER(Purple Top)SARS-COV-2 Vaccination 03/15/2020, 04/09/2020, 10/13/2020, 05/11/2021   PNEUMOCOCCAL CONJUGATE-20 05/12/2023   Pfizer Covid-19 Vaccine Bivalent Booster 45yrs & up 10/12/2021   Pfizer(Comirnaty)Fall Seasonal Vaccine 12 years and older 10/13/2023   Pneumococcal Polysaccharide-23 11/15/2018   Respiratory Syncytial Virus Vaccine,Recomb Aduvanted(Arexvy) 10/02/2022   Td 12/29/1997, 01/27/2008   Tdap 05/04/2018   Zoster Recombinant(Shingrix) 12/11/2022, 04/03/2023  4. Prostate cancer screening- low risk prior trend- update psa today   Lab Results  Component Value Date  PSA 1.68 09/09/2023   PSA 1.93 07/08/2023   PSA 1.91 05/12/2023   5. Colon cancer screening - 03/10/2023 with Dr. Aneita and 10 year repeat 6. Skin cancer screening- Dr Bonney as needed- 3 year follow up. advised regular sunscreen use. Denies worrisome, changing, or new skin lesions.  7. Smoking associated screening (lung cancer screening, AAA screen 65-75, UA)- never smoker 8. STD screening - only active with spouse  Status of chronic or acute concerns   #social update- son was married in [place]  #right shoulder pain - doesn't normally hurt but raising it up bothers it and with sleeping on that side. Pain with behind his back as well. Months of issues. Has not tried any medicines.  -open to physical therapy and referred  -also thumb pain and plays guitar- had recommended Voltaren -ongoing issues  #History of stroke-patient remains on Plavix  75 mg and aspirin 325 mg per Dr. Eliza recommendations   #subclavian artery stenosis-remains on risk factor modification with statin, Plavix .  Originally presented with subclavian steal syndrome with vertigo and blurred vision- in  general no issues but did have slight issues brief with sitting twice but not worsening pattern- if worsens may need further evaluation and months apart  Dr. Eliza in 2019 said prn follow up for concerns per patient.   # Diabetes S: Medication:Metformin  500 mg twice daily Exercise and diet- still walking 2.5 miles a disability many days- up to 4 miles.  Trying to cook at home/reasonably healthy  Lab Results  Component Value Date   HGBA1C 6.8 (A) 05/16/2024   HGBA1C 6.3 11/17/2023   HGBA1C 6.4 05/12/2023  A/P: hopefully stable- update a1c today. Continue current meds for now    #hyperlipidemia/LFT elevation.  LDL goal under 70 with history of stroke S: Medication:Rosuvastatin  40 mg daily, fish oil, niacin.  Lab Results  Component Value Date   CHOL 115 05/16/2024   HDL 32.90 (L) 05/16/2024   LDLCALC 46 05/16/2024   LDLDIRECT 57.0 05/12/2023   TRIG 177.0 (H) 05/16/2024   CHOLHDL 3 05/16/2024  A/P: at goal last year- update today- continue current medications   # Depression S: Medication:Paxil  10 mg trial 2023 down from 20 mg.      11/17/2024   10:02 AM 11/17/2023    2:25 PM 05/12/2023   12:59 PM  Depression screen PHQ 2/9  Decreased Interest 0 0 0  Down, Depressed, Hopeless 0 1 0  PHQ - 2 Score 0 1 0  Altered sleeping 0 0 0  Tired, decreased energy 0 1 0  Change in appetite 0 0 0  Feeling bad or failure about yourself  0 1 0  Trouble concentrating 0 0 0  Moving slowly or fidgety/restless 0 0   Suicidal thoughts 0 0 0  PHQ-9 Score 0 3  0   Difficult doing work/chores Not difficult at all Not difficult at all Not difficult at all     Data saved with a previous flowsheet row definition    A/P: full remission- continue current medications    #BPH S:Nocturia up to 2-3 times a night even  on Flomax - slightly improved A/P: reasonable but imperfect control- continue current medications    Tympanostomy tube 2017 in left ear- replaced in 2020 and 01/14/22 with Dr. Carlie-  removed 11/08/24- he follows water precautiosn  #reassuring sleep study in 2025. Might try breathe right strips for mouth breathing  #erectile dysfunction (ED) - Cialis  helpful  Recommended follow up: Return in about 6 months (around  05/17/2025) for followup or sooner if needed.Schedule b4 you leave. Future Appointments  Date Time Provider Department Center  11/14/2025  3:00 PM [facility] ANNUAL WELLNESS VISIT 1 [facility] [place]   Lab/Order associations: fasting   ICD-10-CM   1. Preventative health care  Z00.00     2. Type II diabetes mellitus, well controlled (HCC)  E11.9     3. Hyperlipidemia, unspecified hyperlipidemia type  E78.5     4. Acute pain of right shoulder  M25.511     5. Screening for prostate cancer  Z12.5       No orders of the defined types were placed in this encounter.   Return precautions advised.  Garnette Lukes, MD

## 2024-11-17 NOTE — Addendum Note (Signed)
 Addended by: KATRINKA GARNETTE KIDD on: 11/17/2024 10:48 AM   Modules accepted: Orders

## 2024-11-17 NOTE — Patient Instructions (Addendum)
 Schedule physical therapy before you leave for the right shoulder at the desk  Please stop by lab before you go If you have mychart- we will send your results within 3 business days of us  receiving them.  If you do not have mychart- we will call you about results within 5 business days of us  receiving them.  *please also note that you will see labs on mychart as soon as they post. I will later go in and write notes on them- will say notes from Dr. Katrinka   Recommended follow up: Return in about 6 months (around 05/17/2025) for followup or sooner if needed.Schedule b4 you leave.

## 2024-11-30 ENCOUNTER — Encounter: Payer: Self-pay | Admitting: Family Medicine

## 2024-11-30 NOTE — Telephone Encounter (Signed)
 Pt is in the referral work que. Once insurance is verified, pt will be called to schedule.

## 2024-12-15 ENCOUNTER — Ambulatory Visit: Admitting: Physical Therapy

## 2024-12-15 ENCOUNTER — Encounter: Payer: Self-pay | Admitting: Physical Therapy

## 2024-12-15 DIAGNOSIS — M25511 Pain in right shoulder: Secondary | ICD-10-CM

## 2024-12-15 DIAGNOSIS — G8929 Other chronic pain: Secondary | ICD-10-CM | POA: Diagnosis not present

## 2024-12-15 DIAGNOSIS — M25611 Stiffness of right shoulder, not elsewhere classified: Secondary | ICD-10-CM

## 2024-12-15 NOTE — Therapy (Signed)
 OUTPATIENT PHYSICAL THERAPY SHOULDER EVALUATION   Patient Name: NEZAR BUCKLES MRN: [id number] DOB:04-19-1956, 68 y.o., male Today's Date: 12/15/2024  END OF SESSION:  PT End of Session - 12/15/24 0936     Visit Number 1    Number of Visits 12    Date for Recertification  02/09/25    Authorization Type BCBS- submitted 12/15/24    PT Start Time 0931    PT Stop Time 1024    PT Time Calculation (min) 53 min    Activity Tolerance Patient tolerated treatment well    Behavior During Therapy [facility] for tasks assessed/performed          Past Medical History:  Diagnosis Date   Allergy    Anxiety    Arthritis    Depression (disease)    Diabetes mellitus (HCC)    Erosive esophagitis    GERD (gastroesophageal reflux disease)    Hemorrhoids    History of cerebrovascular accident    Hyperlipidemia    Peripheral vascular disease    Stroke (HCC)    2001   Subclavian artery stenosis, right    Tubular adenoma polyp of rectum 07/2010   Past Surgical History:  Procedure Laterality Date   APPENDECTOMY  1967   colon polyp removal  12/29/1965   COLON SURGERY  1967   Polp removal   COLONOSCOPY     Patient Active Problem List   Diagnosis Date Noted   Recurrent major depressive disorder, in full remission 05/16/2022   Subclavian artery stenosis, right 05/04/2018   Visual disturbance 01/07/2018   Chondromalacia 11/05/2017   History of adenomatous polyp of colon 04/24/2016   BPH associated with nocturia 04/24/2016   Type II diabetes mellitus, well controlled (HCC) 12/11/2014   Depression 08/21/2008   Hyperlipidemia 07/26/2007   GERD 07/26/2007   History of CVA (cerebrovascular accident) 07/26/2007    PCP: Dr. Garnette Lukes  REFERRING PROVIDER: Dr. Garnette Lukes  REFERRING DIAG: Acute right sided shoulder pain  THERAPY DIAG:  Chronic right shoulder pain  Stiffness of right shoulder, not elsewhere classified  Rationale for Evaluation and Treatment: Rehabilitation  ONSET  DATE: 3+ mos , may have gotten better then worse   SUBJECTIVE:                                                                                                                                                                                      SUBJECTIVE STATEMENT: Patient presents with acute right sided shoulder pain referred by Dr. Lukes for approximately... The pain is located in the [name], top and front.  He feels pain with reaching , Lying Rt side, reaching back.  No  radiation of symptoms No weakness tingling yeah just from positioning yeah yeah Min neck pain just in the last week or 2.   Patient is limited in his ability to sleep, home tasks, going to the gym.  He can't pull the mower with his Rt arm. He stopped going due to this.   He currently plays pickle ball He would like to be able to continue being active and reduce pain .    Hand dominance: Left handed   PERTINENT HISTORY: Allergies anxiety arthritis depression diabetes GERD history of CVA peripheral vascular disease high blood pressure  PAIN:  Are you having pain? Yes: NPRS scale: pain stays moderate, dull pain 4/10-5/10 Pain location: Rt superior shoulder Pain description: dull ache  Aggravating factors: Reaching back out and overhead, sleeping on right shoulder Relieving factors: no meds, changing positions  PRECAUTIONS: None  RED FLAGS: None   WEIGHT BEARING RESTRICTIONS: No  FALLS:  Has patient fallen in last 6 months? No  LIVING ENVIRONMENT: Lives with: lives with their spouse Lives in: House/apartment Stairs: no issues  Has following equipment at home: None  OCCUPATION:   PLOF: Independent, Vocation/Vocational requirements: reitred , and Leisure: tax adviser, diplomatic services operational officer, active   PATIENT GOALS: Reduce pain be able to lift weights or do what ever he wants with his right arm  NEXT MD VISIT:   OBJECTIVE:  Note: Objective measures were completed at Evaluation unless otherwise noted.  DIAGNOSTIC  FINDINGS:  None  PATIENT SURVEYS:  Quick Dash: 18  COGNITION: Overall cognitive status: Within functional limits for tasks assessed     SENSATION: WFL  POSTURE: Slightly rounded upper back with right glenohumeral internal rotation  UPPER EXTREMITY ROM:   Active ROM Right eval Left eval  Shoulder flexion 124 pain  Passive to 140 with pain  160  Shoulder extension 55   Shoulder abduction 90 pain  Passive to 110 pain  160  Shoulder adduction    Shoulder internal rotation FR TL spine   Supine AROM 60 with abd  FR mid T   Shoulder external rotation FR T1-2   Supine AROM 55 deg with abd  FR T3    Passive ROM limited in flexion, abduction, ER and IR end range  IR 55 with pain , ER 60 with pain    UPPER EXTREMITY MMT:  MMT Right eval Left eval  Shoulder flexion   4+ 5  Shoulder extension 4+ 4+  Shoulder abduction 4+ 5  Shoulder adduction    Shoulder internal rotation 5 5  Shoulder external rotation 5 5  Middle trapezius 4+ 5  Lower trapezius    Elbow flexion    Elbow extension    Wrist flexion    Wrist extension    Wrist ulnar deviation    Wrist radial deviation    Wrist pronation    Wrist supination    Grip strength (lbs)    (Blank rows = not tested)  Patient with good scapular retraction elevation and depression  SHOULDER SPECIAL TESTS: Impingement tests: Neer impingement test: negative Neg painful arc  SLAP lesions: NT  Instability tests: NT  Rotator cuff assessment: Empty can test: negative Biceps assessment: NT   JOINT MOBILITY TESTING:  Stiffness in Rt GH joint   PALPATION:  Min TTP Rt ant, superior shoulder, no  pain posteriorly, post cuff or med scap border  TREATMENT DATE: 12/15/2024 PT evaluation completed patient given exercises to perform at home discussed evaluation findings plan of care and symptom  management   PATIENT EDUCATION: Education details: Self-care see above Person educated: Patient Education method: Explanation, Demonstration, and Handouts Education comprehension: verbalized understanding, verbal cues required, tactile cues required, and needs further education  HOME EXERCISE PROGRAM: Access Code: [email or web address] URL: [email or web address] Date: 12/15/2024 Prepared by: Delon Norma  Exercises - Standing Shoulder Flexion AAROM with Dowel  - 1 x daily - 7 x weekly - 2 sets - 10 reps - 10 hold - Standing Shoulder Abduction AAROM with Dowel  - 1 x daily - 7 x weekly - 2 sets - 10 reps - 10 hold - Standing Shoulder Horizontal Abduction with Resistance  - 1 x daily - 7 x weekly - 2 sets - 10 reps - 5 hold - Shoulder extension with resistance - Neutral  - 1 x daily - 7 x weekly - 2 sets - 10 reps - 5 hold - Standing Shoulder Row with Anchored Resistance  - 1 x daily - 7 x weekly - 2 sets - 10 reps - 5 hold  ASSESSMENT:  CLINICAL IMPRESSION: Patient is a 68y.o. male who was seen today for physical therapy evaluation and treatment for right sided shoulder pain.  Symptoms are consistent with possible mild osteoarthritis but no concern for rotator cuff tear or instability.  OBJECTIVE IMPAIRMENTS: decreased coordination, decreased knowledge of condition, decreased mobility, decreased ROM, decreased strength, hypomobility, increased fascial restrictions, impaired flexibility, impaired UE functional use, and pain.   ACTIVITY LIMITATIONS: carrying, lifting, sleeping, dressing, and reach over head  PARTICIPATION LIMITATIONS: cleaning, community activity, and yard work  PERSONAL FACTORS: Time since onset of injury/illness/exacerbation and 3+ comorbidities: Previous stroke, diabetes, depression are also affecting patient's functional outcome.   REHAB POTENTIAL: Excellent  CLINICAL DECISION MAKING: Stable/uncomplicated  EVALUATION COMPLEXITY: Low   GOALS: Goals  reviewed with patient? Yes  SHORT TERM GOALS: Target date: 12/29/2024    Patient will be independent with initial home exercise program for shoulder mobility and strength Baseline: Goal status: INITIAL  2.  Patient will improve right shoulder flexion, abduction with active assisted range of motion by 15 degrees Baseline:  Goal status: INITIAL   LONG TERM GOALS: Target date: 02/09/2025    Patient will be independent with final HEP upon discharge from PT and report consistent benefit following exercise completion.    Baseline:  Goal status: INITIAL  2.  Patient will be able to demonstrate shoulder strength to 5/5 in all planes in order to maximize functional use of UEs for ADLs, Goal Status:   Baseline:  Goal status: INITIAL  3.  Patient will resume all resistance training at the gym without increased shoulder pain, modifications okay Baseline:  Goal status: INITIAL  4. Patient will be able to demonstrate proper posture and lifting techniques related to shoulder pain and reduction of symptoms.   Baseline:  Goal status: INITIAL  5.  Quick dash score will improve to 10 or less  Baseline: 16 Goal status: INITIAL   PLAN:  PT FREQUENCY: 1-2x/week  PT DURATION: 8 weeks  PLANNED INTERVENTIONS: [id number]- PT Re-evaluation, 97750- Physical Performance Testing, 97110-Therapeutic exercises, 97530- Therapeutic activity, 97112- Neuromuscular re-education, 97535- Self Care, [id number]- Manual therapy, Patient/Family education, Joint mobilization, Cryotherapy, and Moist heat  PLAN FOR NEXT SESSION: Check home exercise program , check in with UE ROM, will be out of town for a week .  Manual ,posture, gym /DB exercises  Marybel Alcott, PT 12/15/2024, 10:46 AM

## 2025-01-02 ENCOUNTER — Ambulatory Visit: Admitting: Physical Therapy

## 2025-01-02 ENCOUNTER — Encounter: Payer: Self-pay | Admitting: Physical Therapy

## 2025-01-02 DIAGNOSIS — M25611 Stiffness of right shoulder, not elsewhere classified: Secondary | ICD-10-CM

## 2025-01-02 DIAGNOSIS — M25511 Pain in right shoulder: Secondary | ICD-10-CM

## 2025-01-02 DIAGNOSIS — G8929 Other chronic pain: Secondary | ICD-10-CM

## 2025-01-02 NOTE — Therapy (Signed)
 " OUTPATIENT PHYSICAL THERAPY SHOULDER EVALUATION   Patient Name: Darrell Gaines MRN: [id number] DOB:1956-06-14, 69 y.o., male Today's Date: 01/02/2025  END OF SESSION:  PT End of Session - 01/02/25 1310     Visit Number 2    Number of Visits 12    Date for Recertification  02/09/25    Authorization Type BCBS-no auth needed for rehab    PT Start Time 1310    PT Stop Time 1348    PT Time Calculation (min) 38 min    Activity Tolerance Patient tolerated treatment well    Behavior During Therapy [facility] for tasks assessed/performed          Past Medical History:  Diagnosis Date   Allergy    Anxiety    Arthritis    Depression (disease)    Diabetes mellitus (HCC)    Erosive esophagitis    GERD (gastroesophageal reflux disease)    Hemorrhoids    History of cerebrovascular accident    Hyperlipidemia    Peripheral vascular disease    Stroke (HCC)    2001   Subclavian artery stenosis, right    Tubular adenoma polyp of rectum 07/2010   Past Surgical History:  Procedure Laterality Date   APPENDECTOMY  1967   colon polyp removal  12/29/1965   COLON SURGERY  1967   Polp removal   COLONOSCOPY     Patient Active Problem List   Diagnosis Date Noted   Recurrent major depressive disorder, in full remission 05/16/2022   Subclavian artery stenosis, right 05/04/2018   Visual disturbance 01/07/2018   Chondromalacia 11/05/2017   History of adenomatous polyp of colon 04/24/2016   BPH associated with nocturia 04/24/2016   Type II diabetes mellitus, well controlled (HCC) 12/11/2014   Depression 08/21/2008   Hyperlipidemia 07/26/2007   GERD 07/26/2007   History of CVA (cerebrovascular accident) 07/26/2007    PCP: Dr. Garnette Lukes  REFERRING PROVIDER: Dr. Garnette Lukes  REFERRING DIAG: Acute right sided shoulder pain  THERAPY DIAG:  Chronic right shoulder pain  Stiffness of right shoulder, not elsewhere classified  Rationale for Evaluation and Treatment:  Rehabilitation  ONSET DATE: 3+ mos , may have gotten better then worse   SUBJECTIVE:                                                                                                                                                                                      SUBJECTIVE STATEMENT: Pt has been doing HEP. Feels like it helps to loosen up his shoulder, mild pain    Patient presents with acute right sided shoulder pain referred by Dr. Lukes for approximately... The pain is  located in the [name], top and front.  He feels pain with reaching , Lying Rt side, reaching back.  No radiation of symptoms No weakness tingling yeah just from positioning yeah yeah Min neck pain just in the last week or 2.   Patient is limited in his ability to sleep, home tasks, going to the gym.  He can't pull the mower with his Rt arm. He stopped going due to this.   He currently plays pickle ball He would like to be able to continue being active and reduce pain .    Hand dominance: Left handed   PERTINENT HISTORY: Allergies anxiety arthritis depression diabetes GERD history of CVA peripheral vascular disease high blood pressure  PAIN:  Are you having pain? Yes: NPRS scale: pain stays moderate, dull pain 4/10-5/10 Pain location: Rt superior shoulder Pain description: dull ache  Aggravating factors: Reaching back out and overhead, sleeping on right shoulder Relieving factors: no meds, changing positions  PRECAUTIONS: None  RED FLAGS: None   WEIGHT BEARING RESTRICTIONS: No  FALLS:  Has patient fallen in last 6 months? No  LIVING ENVIRONMENT: Lives with: lives with their spouse Lives in: House/apartment Stairs: no issues  Has following equipment at home: None  OCCUPATION:   PLOF: Independent, Vocation/Vocational requirements: reitred , and Leisure: tax adviser, diplomatic services operational officer, active   PATIENT GOALS: Reduce pain be able to lift weights or do what ever he wants with his right arm  NEXT MD VISIT:    OBJECTIVE:  Note: Objective measures were completed at Evaluation unless otherwise noted.  DIAGNOSTIC FINDINGS:  None  PATIENT SURVEYS:  Quick Dash: 18  COGNITION: Overall cognitive status: Within functional limits for tasks assessed     SENSATION: WFL  POSTURE: Slightly rounded upper back with right glenohumeral internal rotation  UPPER EXTREMITY ROM:   Active ROM Right eval Left eval  Shoulder flexion 124 pain  Passive to 140 with pain  160  Shoulder extension 55   Shoulder abduction 90 pain  Passive to 110 pain  160  Shoulder adduction    Shoulder internal rotation FR TL spine   Supine AROM 60 with abd  FR mid T   Shoulder external rotation FR T1-2   Supine AROM 55 deg with abd  FR T3    Passive ROM limited in flexion, abduction, ER and IR end range  IR 55 with pain , ER 60 with pain    UPPER EXTREMITY MMT:  MMT Right eval Left eval  Shoulder flexion   4+ 5  Shoulder extension 4+ 4+  Shoulder abduction 4+ 5  Shoulder adduction    Shoulder internal rotation 5 5  Shoulder external rotation 5 5  Middle trapezius 4+ 5  Lower trapezius    Elbow flexion    Elbow extension    Wrist flexion    Wrist extension    Wrist ulnar deviation    Wrist radial deviation    Wrist pronation    Wrist supination    Grip strength (lbs)    (Blank rows = not tested)  Patient with good scapular retraction elevation and depression  SHOULDER SPECIAL TESTS: Impingement tests: Neer impingement test: negative Neg painful arc  SLAP lesions: NT  Instability tests: NT  Rotator cuff assessment: Empty can test: negative Biceps assessment: NT   JOINT MOBILITY TESTING:  Stiffness in Rt GH joint   PALPATION:  Min TTP Rt ant, superior shoulder, no  pain posteriorly, post cuff or med scap border  TREATMENT DATE:   01/02/2025 Therapeutic  Exercise: Aerobic: Supine: shoulder flexion/cane x 12;  shoulder ER butterfly (hands behind head ) x 10 Prom R shoulder, all motions.  Seated: pulley/flexion x 15;  Standing: wall slides R UE x 15;   shoulder IR behind back/stick 3 x 5;  Stretches:  Neuromuscular Re-education: Manual Therapy: GHJ mobs post and inf; LAD,  Therapeutic Activity: Self Care:    12/15/2024 PT evaluation completed patient given exercises to perform at home discussed evaluation findings plan of care and symptom management   PATIENT EDUCATION: Education details: Self-care see above Person educated: Patient Education method: Explanation, Demonstration, and Handouts Education comprehension: verbalized understanding, verbal cues required, tactile cues required, and needs further education  HOME EXERCISE PROGRAM: Access Code: [email or web address] URL: [email or web address] Date: 12/15/2024 Prepared by: Delon Norma  Exercises - Standing Shoulder Flexion AAROM with Dowel  - 1 x daily - 7 x weekly - 2 sets - 10 reps - 10 hold - Standing Shoulder Abduction AAROM with Dowel  - 1 x daily - 7 x weekly - 2 sets - 10 reps - 10 hold - Standing Shoulder Horizontal Abduction with Resistance  - 1 x daily - 7 x weekly - 2 sets - 10 reps - 5 hold - Shoulder extension with resistance - Neutral  - 1 x daily - 7 x weekly - 2 sets - 10 reps - 5 hold - Standing Shoulder Row with Anchored Resistance  - 1 x daily - 7 x weekly - 2 sets - 10 reps - 5 hold  ASSESSMENT:  CLINICAL IMPRESSION: Continued focus on restoring PROM and AROM. Updated HEP and reviewed current HEP, pt with improved mechanics with row motion after practice today. He continues to have  joint stiffness that limits ROM, and will benefit from continued focus on this.   Eval: Patient is a 69y.o. male who was seen today for physical therapy evaluation and treatment for right sided shoulder pain.  Symptoms are consistent with possible mild osteoarthritis but no  concern for rotator cuff tear or instability.  OBJECTIVE IMPAIRMENTS: decreased coordination, decreased knowledge of condition, decreased mobility, decreased ROM, decreased strength, hypomobility, increased fascial restrictions, impaired flexibility, impaired UE functional use, and pain.   ACTIVITY LIMITATIONS: carrying, lifting, sleeping, dressing, and reach over head  PARTICIPATION LIMITATIONS: cleaning, community activity, and yard work  PERSONAL FACTORS: Time since onset of injury/illness/exacerbation and 3+ comorbidities: Previous stroke, diabetes, depression are also affecting patient's functional outcome.   REHAB POTENTIAL: Excellent  CLINICAL DECISION MAKING: Stable/uncomplicated  EVALUATION COMPLEXITY: Low   GOALS: Goals reviewed with patient? Yes  SHORT TERM GOALS: Target date: 12/29/2024    Patient will be independent with initial home exercise program for shoulder mobility and strength Baseline: Goal status: INITIAL  2.  Patient will improve right shoulder flexion, abduction with active assisted range of motion by 15 degrees Baseline:  Goal status: INITIAL   LONG TERM GOALS: Target date: 02/09/2025    Patient will be independent with final HEP upon discharge from PT and report consistent benefit following exercise completion.    Baseline:  Goal status: INITIAL  2.  Patient will be able to demonstrate shoulder strength to 5/5 in all planes in order to maximize functional use of UEs for ADLs, Goal Status:   Baseline:  Goal status: INITIAL  3.  Patient will resume all resistance training at the gym without increased shoulder pain, modifications okay Baseline:  Goal status: INITIAL  4. Patient will be able to demonstrate proper  posture and lifting techniques related to shoulder pain and reduction of symptoms.   Baseline:  Goal status: INITIAL  5.  Quick dash score will improve to 10 or less  Baseline: 16 Goal status: INITIAL   PLAN:  PT FREQUENCY:  1-2x/week  PT DURATION: 8 weeks  PLANNED INTERVENTIONS: 97164- PT Re-evaluation, 97750- Physical Performance Testing, 97110-Therapeutic exercises, 97530- Therapeutic activity, 97112- Neuromuscular re-education, 97535- Self Care, [id number]- Manual therapy, Patient/Family education, Joint mobilization, Cryotherapy, and Moist heat  PLAN FOR NEXT SESSION: Check home exercise program , check in with UE ROM, will be out of town for a week .  Manual ,posture, gym /DB exercises   Tinnie Don, PT, DPT 3:32 PM  01/02/2025  "

## 2025-01-05 ENCOUNTER — Ambulatory Visit: Admitting: Physical Therapy

## 2025-01-05 ENCOUNTER — Encounter: Payer: Self-pay | Admitting: Physical Therapy

## 2025-01-05 DIAGNOSIS — G8929 Other chronic pain: Secondary | ICD-10-CM

## 2025-01-05 DIAGNOSIS — M25511 Pain in right shoulder: Secondary | ICD-10-CM | POA: Diagnosis not present

## 2025-01-05 DIAGNOSIS — M25611 Stiffness of right shoulder, not elsewhere classified: Secondary | ICD-10-CM | POA: Diagnosis not present

## 2025-01-05 NOTE — Therapy (Signed)
 " OUTPATIENT PHYSICAL THERAPY SHOULDER TREATMENT   Patient Name: Darrell Gaines MRN: [id number] DOB:January 17, 1956, 69 y.o., male Today's Date: 01/05/2025  END OF SESSION:  PT End of Session - 01/05/25 1444     Visit Number 3    Number of Visits 12    Date for Recertification  02/09/25    Authorization Type BCBS-no auth needed for rehab    PT Start Time 1351    PT Stop Time 1433    PT Time Calculation (min) 42 min    Activity Tolerance Patient tolerated treatment well    Behavior During Therapy [facility] for tasks assessed/performed           Past Medical History:  Diagnosis Date   Allergy    Anxiety    Arthritis    Depression (disease)    Diabetes mellitus (HCC)    Erosive esophagitis    GERD (gastroesophageal reflux disease)    Hemorrhoids    History of cerebrovascular accident    Hyperlipidemia    Peripheral vascular disease    Stroke (HCC)    2001   Subclavian artery stenosis, right    Tubular adenoma polyp of rectum 07/2010   Past Surgical History:  Procedure Laterality Date   APPENDECTOMY  1967   colon polyp removal  12/29/1965   COLON SURGERY  1967   Polp removal   COLONOSCOPY     Patient Active Problem List   Diagnosis Date Noted   Recurrent major depressive disorder, in full remission 05/16/2022   Subclavian artery stenosis, right 05/04/2018   Visual disturbance 01/07/2018   Chondromalacia 11/05/2017   History of adenomatous polyp of colon 04/24/2016   BPH associated with nocturia 04/24/2016   Type II diabetes mellitus, well controlled (HCC) 12/11/2014   Depression 08/21/2008   Hyperlipidemia 07/26/2007   GERD 07/26/2007   History of CVA (cerebrovascular accident) 07/26/2007    PCP: Dr. Garnette Lukes  REFERRING PROVIDER: Dr. Garnette Lukes  REFERRING DIAG: Acute right sided shoulder pain  THERAPY DIAG:  Chronic right shoulder pain  Stiffness of right shoulder, not elsewhere classified  Rationale for Evaluation and Treatment:  Rehabilitation  ONSET DATE: 3+ mos , may have gotten better then worse   SUBJECTIVE:                                                                                                                                                                                      SUBJECTIVE STATEMENT: Pt has been doing HEP. Feels like it helps to loosen up his shoulder, mild pain    Patient presents with acute right sided shoulder pain referred by Dr. Lukes for approximately... The pain  is located in the [name], top and front.  He feels pain with reaching , Lying Rt side, reaching back.  No radiation of symptoms No weakness tingling yeah just from positioning yeah yeah Min neck pain just in the last week or 2.   Patient is limited in his ability to sleep, home tasks, going to the gym.  He can't pull the mower with his Rt arm. He stopped going due to this.   He currently plays pickle ball He would like to be able to continue being active and reduce pain .    Hand dominance: Left handed   PERTINENT HISTORY: Allergies anxiety arthritis depression diabetes GERD history of CVA peripheral vascular disease high blood pressure  PAIN:  Are you having pain? Yes: NPRS scale: pain stays moderate, dull pain 4/10-5/10 Pain location: Rt superior shoulder Pain description: dull ache  Aggravating factors: Reaching back out and overhead, sleeping on right shoulder Relieving factors: no meds, changing positions  PRECAUTIONS: None  RED FLAGS: None   WEIGHT BEARING RESTRICTIONS: No  FALLS:  Has patient fallen in last 6 months? No  LIVING ENVIRONMENT: Lives with: lives with their spouse Lives in: House/apartment Stairs: no issues  Has following equipment at home: None  OCCUPATION:   PLOF: Independent, Vocation/Vocational requirements: reitred , and Leisure: tax adviser, diplomatic services operational officer, active   PATIENT GOALS: Reduce pain be able to lift weights or do what ever he wants with his right arm  NEXT MD VISIT:    OBJECTIVE:  Note: Objective measures were completed at Evaluation unless otherwise noted.  DIAGNOSTIC FINDINGS:  None  PATIENT SURVEYS:  Quick Dash: 18  COGNITION: Overall cognitive status: Within functional limits for tasks assessed     SENSATION: WFL  POSTURE: Slightly rounded upper back with right glenohumeral internal rotation  UPPER EXTREMITY ROM:   Active ROM Right eval Left eval  Shoulder flexion 124 pain  Passive to 140 with pain  160  Shoulder extension 55   Shoulder abduction 90 pain  Passive to 110 pain  160  Shoulder adduction    Shoulder internal rotation FR TL spine   Supine AROM 60 with abd  FR mid T   Shoulder external rotation FR T1-2   Supine AROM 55 deg with abd  FR T3    Passive ROM limited in flexion, abduction, ER and IR end range  IR 55 with pain , ER 60 with pain    UPPER EXTREMITY MMT:  MMT Right eval Left eval  Shoulder flexion   4+ 5  Shoulder extension 4+ 4+  Shoulder abduction 4+ 5  Shoulder adduction    Shoulder internal rotation 5 5  Shoulder external rotation 5 5  Middle trapezius 4+ 5  Lower trapezius    Elbow flexion    Elbow extension    Wrist flexion    Wrist extension    Wrist ulnar deviation    Wrist radial deviation    Wrist pronation    Wrist supination    Grip strength (lbs)    (Blank rows = not tested)  Patient with good scapular retraction elevation and depression  SHOULDER SPECIAL TESTS: Impingement tests: Neer impingement test: negative Neg painful arc  SLAP lesions: NT  Instability tests: NT  Rotator cuff assessment: Empty can test: negative Biceps assessment: NT   JOINT MOBILITY TESTING:  Stiffness in Rt GH joint   PALPATION:  Min TTP Rt ant, superior shoulder, no  pain posteriorly, post cuff or med scap border  TREATMENT DATE:   01/05/2025 Therapeutic  Exercise: Aerobic: Supine: shoulder flexion/cane, 2lb  x 12;   arom/flexion x 15;  shoulder ER butterfly (hands behind head ) x 15 Prom R shoulder, all motions.  Seated: pulley/flexion, abd x 15 ea;  Standing: wall slides R UE x 15;   shoulder IR behind back/stick 3 x 5;  2 hands behind back IR 2 x 5;   Rows , low rows  GTB x 15 ea;  Stretches:  Neuromuscular Re-education: Manual Therapy: GHJ mobs post and inf; LAD,  Therapeutic Activity: Self Care:    Previous: Therapeutic Exercise: Aerobic: Supine: shoulder flexion/cane x 12;  shoulder ER butterfly (hands behind head ) x 10 Prom R shoulder, all motions.  Seated: pulley/flexion x 15;  Standing: wall slides R UE x 15;   shoulder IR behind back/stick 3 x 5;  Stretches:  Neuromuscular Re-education: Manual Therapy: GHJ mobs post and inf; LAD,  Therapeutic Activity: Self Care:   12/15/2024 PT evaluation completed patient given exercises to perform at home discussed evaluation findings plan of care and symptom management   PATIENT EDUCATION: Education details: updated and reviewed HEP Person educated: Patient Education method: Explanation, Demonstration, and Handouts Education comprehension: verbalized understanding, verbal cues required, tactile cues required, and needs further education  HOME EXERCISE PROGRAM: Access Code: [email or web address] URL: [email or web address] Date: 12/15/2024 Prepared by: Delon Norma  Exercises - Standing Shoulder Flexion AAROM with Dowel  - 1 x daily - 7 x weekly - 2 sets - 10 reps - 10 hold - Standing Shoulder Abduction AAROM with Dowel  - 1 x daily - 7 x weekly - 2 sets - 10 reps - 10 hold - Standing Shoulder Horizontal Abduction with Resistance  - 1 x daily - 7 x weekly - 2 sets - 10 reps - 5 hold - Shoulder extension with resistance - Neutral  - 1 x daily - 7 x weekly - 2 sets - 10 reps - 5 hold - Standing Shoulder Row with Anchored Resistance  - 1 x daily - 7 x weekly - 2 sets - 10  reps - 5 hold  ASSESSMENT:  CLINICAL IMPRESSION: Continued focus on restoring PROM and AROM. He has mild pain at end range with prom, but little pain with other ther ex. He has joint stiffness that limits ROM, and will benefit from continued focus on this.   Eval: Patient is a 69y.o. male who was seen today for physical therapy evaluation and treatment for right sided shoulder pain.  Symptoms are consistent with possible mild osteoarthritis but no concern for rotator cuff tear or instability.  OBJECTIVE IMPAIRMENTS: decreased coordination, decreased knowledge of condition, decreased mobility, decreased ROM, decreased strength, hypomobility, increased fascial restrictions, impaired flexibility, impaired UE functional use, and pain.   ACTIVITY LIMITATIONS: carrying, lifting, sleeping, dressing, and reach over head  PARTICIPATION LIMITATIONS: cleaning, community activity, and yard work  PERSONAL FACTORS: Time since onset of injury/illness/exacerbation and 3+ comorbidities: Previous stroke, diabetes, depression are also affecting patient's functional outcome.   REHAB POTENTIAL: Excellent  CLINICAL DECISION MAKING: Stable/uncomplicated  EVALUATION COMPLEXITY: Low   GOALS: Goals reviewed with patient? Yes  SHORT TERM GOALS: Target date: 12/29/2024    Patient will be independent with initial home exercise program for shoulder mobility and strength Baseline: Goal status: INITIAL  2.  Patient will improve right shoulder flexion, abduction with active assisted range of motion by 15 degrees Baseline:  Goal status: INITIAL   LONG TERM GOALS: Target date: 02/09/2025  Patient will be independent with final HEP upon discharge from PT and report consistent benefit following exercise completion.    Baseline:  Goal status: INITIAL  2.  Patient will be able to demonstrate shoulder strength to 5/5 in all planes in order to maximize functional use of UEs for ADLs, Goal Status:   Baseline:   Goal status: INITIAL  3.  Patient will resume all resistance training at the gym without increased shoulder pain, modifications okay Baseline:  Goal status: INITIAL  4. Patient will be able to demonstrate proper posture and lifting techniques related to shoulder pain and reduction of symptoms.   Baseline:  Goal status: INITIAL  5.  Quick dash score will improve to 10 or less  Baseline: 16 Goal status: INITIAL   PLAN:  PT FREQUENCY: 1-2x/week  PT DURATION: 8 weeks  PLANNED INTERVENTIONS: 97164- PT Re-evaluation, 97750- Physical Performance Testing, 97110-Therapeutic exercises, 97530- Therapeutic activity, 97112- Neuromuscular re-education, 97535- Self Care, [id number]- Manual therapy, Patient/Family education, Joint mobilization, Cryotherapy, and Moist heat  PLAN FOR NEXT SESSION: Check home exercise program , check in with UE ROM, will be out of town for a week .  Manual ,posture, gym /DB exercises  Continue prom, aarom, to improve stiffness.   Tinnie Don, PT, DPT 2:44 PM  01/05/2025  "

## 2025-01-10 ENCOUNTER — Ambulatory Visit: Admitting: Physical Therapy

## 2025-01-10 ENCOUNTER — Encounter: Payer: Self-pay | Admitting: Physical Therapy

## 2025-01-10 DIAGNOSIS — M25511 Pain in right shoulder: Secondary | ICD-10-CM | POA: Diagnosis not present

## 2025-01-10 DIAGNOSIS — G8929 Other chronic pain: Secondary | ICD-10-CM

## 2025-01-10 DIAGNOSIS — M25611 Stiffness of right shoulder, not elsewhere classified: Secondary | ICD-10-CM | POA: Diagnosis not present

## 2025-01-10 NOTE — Therapy (Signed)
 " OUTPATIENT PHYSICAL THERAPY SHOULDER TREATMENT   Patient Name: Darrell Gaines MRN: [id number] DOB:1956/04/12, 69 y.o., male Today's Date: 01/10/2025  END OF SESSION:  PT End of Session - 01/10/25 0932     Visit Number 4    Number of Visits 12    Date for Recertification  02/09/25    Authorization Type BCBS-no auth needed for rehab    PT Start Time 0930    PT Stop Time 1015    PT Time Calculation (min) 45 min    Activity Tolerance Patient tolerated treatment well    Behavior During Therapy [facility] for tasks assessed/performed            Past Medical History:  Diagnosis Date   Allergy    Anxiety    Arthritis    Depression (disease)    Diabetes mellitus (HCC)    Erosive esophagitis    GERD (gastroesophageal reflux disease)    Hemorrhoids    History of cerebrovascular accident    Hyperlipidemia    Peripheral vascular disease    Stroke (HCC)    2001   Subclavian artery stenosis, right    Tubular adenoma polyp of rectum 07/2010   Past Surgical History:  Procedure Laterality Date   APPENDECTOMY  1967   colon polyp removal  12/29/1965   COLON SURGERY  1967   Polp removal   COLONOSCOPY     Patient Active Problem List   Diagnosis Date Noted   Recurrent major depressive disorder, in full remission 05/16/2022   Subclavian artery stenosis, right 05/04/2018   Visual disturbance 01/07/2018   Chondromalacia 11/05/2017   History of adenomatous polyp of colon 04/24/2016   BPH associated with nocturia 04/24/2016   Type II diabetes mellitus, well controlled (HCC) 12/11/2014   Depression 08/21/2008   Hyperlipidemia 07/26/2007   GERD 07/26/2007   History of CVA (cerebrovascular accident) 07/26/2007    PCP: Dr. Garnette Lukes  REFERRING PROVIDER: Dr. Garnette Lukes  REFERRING DIAG: Acute right sided shoulder pain  THERAPY DIAG:  Chronic right shoulder pain  Stiffness of right shoulder, not elsewhere classified  Rationale for Evaluation and Treatment:  Rehabilitation  ONSET DATE: 3+ mos , may have gotten better then worse   SUBJECTIVE:                                                                                                                                                                                      SUBJECTIVE STATEMENT: Shoulder is better overall, still finds early on with his exercises it is sore and then it loosens up. No pain at rest. But when I dont do anything for it I can feel  it getting stiffer. Pain with sidesleeping on Rt side.    Patient presents with acute right sided shoulder pain referred by Dr. Katrinka for approximately... The pain is located in the [name], top and front.  He feels pain with reaching , Lying Rt side, reaching back.  No radiation of symptoms No weakness tingling yeah just from positioning yeah yeah Min neck pain just in the last week or 2.   Patient is limited in his ability to sleep, home tasks, going to the gym.  He can't pull the mower with his Rt arm. He stopped going due to this.   He currently plays pickle ball He would like to be able to continue being active and reduce pain .    Hand dominance: Left handed   PERTINENT HISTORY: Allergies anxiety arthritis depression diabetes GERD history of CVA peripheral vascular disease high blood pressure  PAIN:  Are you having pain? Yes: NPRS scale: pain stays moderate, dull pain 4/10-5/10 Pain location: Rt superior shoulder Pain description: dull ache  Aggravating factors: Reaching back out and overhead, sleeping on right shoulder Relieving factors: no meds, changing positions  PRECAUTIONS: None  RED FLAGS: None   WEIGHT BEARING RESTRICTIONS: No  FALLS:  Has patient fallen in last 6 months? No  LIVING ENVIRONMENT: Lives with: lives with their spouse Lives in: House/apartment Stairs: no issues  Has following equipment at home: None  OCCUPATION:   PLOF: Independent, Vocation/Vocational requirements: reitred , and Leisure:  tax adviser, diplomatic services operational officer, active   PATIENT GOALS: Reduce pain be able to lift weights or do what ever he wants with his right arm  NEXT MD VISIT:   OBJECTIVE:  Note: Objective measures were completed at Evaluation unless otherwise noted.  DIAGNOSTIC FINDINGS:  None  PATIENT SURVEYS:  Quick Dash: 18  COGNITION: Overall cognitive status: Within functional limits for tasks assessed     SENSATION: WFL  POSTURE: Slightly rounded upper back with right glenohumeral internal rotation  UPPER EXTREMITY ROM:   Active ROM Right eval Left eval  Shoulder flexion 124 pain  Passive to 140 with pain  160  Shoulder extension 55   Shoulder abduction 90 pain  Passive to 110 pain  160  Shoulder adduction    Shoulder internal rotation FR TL spine   Supine AROM 60 with abd  FR mid T   Shoulder external rotation FR T1-2   Supine AROM 55 deg with abd  FR T3    Passive ROM limited in flexion, abduction, ER and IR end range  IR 55 with pain , ER 60 with pain    UPPER EXTREMITY MMT:  MMT Right eval Left eval  Shoulder flexion   4+ 5  Shoulder extension 4+ 4+  Shoulder abduction 4+ 5  Shoulder adduction    Shoulder internal rotation 5 5  Shoulder external rotation 5 5  Middle trapezius 4+ 5  Lower trapezius    Elbow flexion    Elbow extension    Wrist flexion    Wrist extension    Wrist ulnar deviation    Wrist radial deviation    Wrist pronation    Wrist supination    Grip strength (lbs)    (Blank rows = not tested)  Patient with good scapular retraction elevation and depression  SHOULDER SPECIAL TESTS: Impingement tests: Neer impingement test: negative Neg painful arc  SLAP lesions: NT  Instability tests: NT  Rotator cuff assessment: Empty can test: negative Biceps assessment: NT   JOINT MOBILITY TESTING:  Stiffness in Rt GH joint   PALPATION:  Min TTP Rt ant, superior shoulder, no  pain posteriorly, post cuff or med scap border                                                                                                                                TREATMENT DATE:    [facility] Adult PT Treatment:                                                DATE: 01/10/25 Therapeutic Exercise: Standing row narrow and wide blue band  Standing extension blue band  External rot. Blue  ER with shoulder flexion green  x 10  Diagonal green band standing x 10  Dowel AAROM Sleeper stretch x 10 sec x 5  Sidelying scaption and flexion 3 lbs x 15 each  PROM , light distraction and MMT check    Therapeutic Exercise: Aerobic: Supine: shoulder flexion/cane, 2lb  x 12;   arom/flexion x 15;  shoulder ER butterfly (hands behind head ) x 15 Prom R shoulder, all motions.  Seated: pulley/flexion, abd x 15 ea;  Standing: wall slides R UE x 15;   shoulder IR behind back/stick 3 x 5;  2 hands behind back IR 2 x 5;   Rows , low rows  GTB x 15 ea;  Stretches:  Neuromuscular Re-education: Manual Therapy: GHJ mobs post and inf; LAD,  Therapeutic Activity: Self Care:    Previous: Therapeutic Exercise: Aerobic: Supine: shoulder flexion/cane x 12;  shoulder ER butterfly (hands behind head ) x 10 Prom R shoulder, all motions.  Seated: pulley/flexion x 15;  Standing: wall slides R UE x 15;   shoulder IR behind back/stick 3 x 5;  Stretches:  Neuromuscular Re-education: Manual Therapy: GHJ mobs post and inf; LAD,  Therapeutic Activity: Self Care:   12/15/2024 PT evaluation completed patient given exercises to perform at home discussed evaluation findings plan of care and symptom management   PATIENT EDUCATION: Education details: updated and reviewed HEP Person educated: Patient Education method: Explanation, Demonstration, and Handouts Education comprehension: verbalized understanding, verbal cues required, tactile cues required, and needs further education  HOME EXERCISE PROGRAM: Access Code: [email or web address] URL: [email or web address] Date: 12/15/2024 Prepared  by: Delon Norma  Exercises - Standing Shoulder Flexion AAROM with Dowel  - 1 x daily - 7 x weekly - 2 sets - 10 reps - 10 hold - Standing Shoulder Abduction AAROM with Dowel  - 1 x daily - 7 x weekly - 2 sets - 10 reps - 10 hold - Standing Shoulder Horizontal Abduction with Resistance  - 1 x daily - 7 x weekly - 2 sets - 10 reps - 5 hold - Shoulder extension with resistance - Neutral  - 1 x daily - 7 x weekly - 2  sets - 10 reps - 5 hold - Standing Shoulder Row with Anchored Resistance  - 1 x daily - 7 x weekly - 2 sets - 10 reps - 5 hold  ASSESSMENT:  CLINICAL IMPRESSION: Patient with improving [name] function, still has some pain with side sleeping and also overhead.  He has noticed some discomfort kayaking/shooting baskets.  Pain min to mod (4/10) but usually not higher than that.  Rt post capsule tighter than L, measured at 30 deg (vs the L side about 50 deg).  Goals are in progress.  He will benefit from skilled PT to attain functional goals.   Eval: Patient is a 69y.o. male who was seen today for physical therapy evaluation and treatment for right sided shoulder pain.  Symptoms are consistent with possible mild osteoarthritis but no concern for rotator cuff tear or instability.  OBJECTIVE IMPAIRMENTS: decreased coordination, decreased knowledge of condition, decreased mobility, decreased ROM, decreased strength, hypomobility, increased fascial restrictions, impaired flexibility, impaired UE functional use, and pain.   ACTIVITY LIMITATIONS: carrying, lifting, sleeping, dressing, and reach over head  PARTICIPATION LIMITATIONS: cleaning, community activity, and yard work  PERSONAL FACTORS: Time since onset of injury/illness/exacerbation and 3+ comorbidities: Previous stroke, diabetes, depression are also affecting patient's functional outcome.   REHAB POTENTIAL: Excellent  CLINICAL DECISION MAKING: Stable/uncomplicated  EVALUATION COMPLEXITY: Low   GOALS: Goals reviewed with  patient? Yes  SHORT TERM GOALS: Target date: 12/29/2024    Patient will be independent with initial home exercise program for shoulder mobility and strength Baseline: Goal status: MET   2.  Patient will improve right shoulder flexion, abduction with active assisted range of motion by 15 degrees Baseline:  Goal status: INITIAL   LONG TERM GOALS: Target date: 02/09/2025  Patient will be independent with final HEP upon discharge from PT and report consistent benefit following exercise completion.    Baseline:  Goal status:ongoing   2.  Patient will be able to demonstrate shoulder strength to 5/5 in all planes in order to maximize functional use of UEs for ADLs Baseline:  Goal status: ongoing   3.  Patient will resume all resistance training at the gym without increased shoulder pain, modifications okay Baseline:  Goal status: ongoing   4. Patient will be able to demonstrate proper posture and lifting techniques related to shoulder pain and reduction of symptoms.   Baseline:  Goal status: INITIAL  5.  Quick dash score will improve to 10 or less  Baseline: 16 Goal status: INITIAL   PLAN:  PT FREQUENCY: 1-2x/week  PT DURATION: 8 weeks  PLANNED INTERVENTIONS: 97164- PT Re-evaluation, 97750- Physical Performance Testing, 97110-Therapeutic exercises, 97530- Therapeutic activity, 97112- Neuromuscular re-education, 97535- Self Care, 02859- Manual therapy, Patient/Family education, Joint mobilization, Cryotherapy, and Moist heat  PLAN FOR NEXT SESSION: Check ROM, Manual ,posture, gym /DB exercises  Continue prom, aarom, to improve stiffness.     Delon Norma, PT 01/10/2025 10:12 AM Phone: [phone] Fax: [phone]  "

## 2025-01-13 ENCOUNTER — Ambulatory Visit: Admitting: Physical Therapy

## 2025-01-13 ENCOUNTER — Encounter: Payer: Self-pay | Admitting: Physical Therapy

## 2025-01-13 DIAGNOSIS — M25511 Pain in right shoulder: Secondary | ICD-10-CM | POA: Diagnosis not present

## 2025-01-13 DIAGNOSIS — M25611 Stiffness of right shoulder, not elsewhere classified: Secondary | ICD-10-CM | POA: Diagnosis not present

## 2025-01-13 DIAGNOSIS — G8929 Other chronic pain: Secondary | ICD-10-CM

## 2025-01-13 NOTE — Therapy (Signed)
 " OUTPATIENT PHYSICAL THERAPY SHOULDER TREATMENT   Patient Name: Darrell Gaines MRN: [id number] DOB:May 22, 1956, 69 y.o., male Today's Date: 01/13/2025  END OF SESSION:  PT End of Session - 01/13/25 0941     Visit Number 5    Number of Visits 12    Date for Recertification  02/09/25    Authorization Type BCBS-no auth needed for rehab    PT Start Time 0935    PT Stop Time 1015    PT Time Calculation (min) 40 min    Activity Tolerance Patient tolerated treatment well    Behavior During Therapy [facility] for tasks assessed/performed             Past Medical History:  Diagnosis Date   Allergy    Anxiety    Arthritis    Depression (disease)    Diabetes mellitus (HCC)    Erosive esophagitis    GERD (gastroesophageal reflux disease)    Hemorrhoids    History of cerebrovascular accident    Hyperlipidemia    Peripheral vascular disease    Stroke (HCC)    2001   Subclavian artery stenosis, right    Tubular adenoma polyp of rectum 07/2010   Past Surgical History:  Procedure Laterality Date   APPENDECTOMY  1967   colon polyp removal  12/29/1965   COLON SURGERY  1967   Polp removal   COLONOSCOPY     Patient Active Problem List   Diagnosis Date Noted   Recurrent major depressive disorder, in full remission 05/16/2022   Subclavian artery stenosis, right 05/04/2018   Visual disturbance 01/07/2018   Chondromalacia 11/05/2017   History of adenomatous polyp of colon 04/24/2016   BPH associated with nocturia 04/24/2016   Type II diabetes mellitus, well controlled (HCC) 12/11/2014   Depression 08/21/2008   Hyperlipidemia 07/26/2007   GERD 07/26/2007   History of CVA (cerebrovascular accident) 07/26/2007    PCP: Dr. Garnette Gaines  REFERRING PROVIDER: Dr. Garnette Gaines  REFERRING DIAG: Acute right sided shoulder pain  THERAPY DIAG:  Chronic right shoulder pain  Stiffness of right shoulder, not elsewhere classified  Rationale for Evaluation and Treatment:  Rehabilitation  ONSET DATE: 3+ mos , may have gotten better then worse   SUBJECTIVE:                                                                                                                                                                                      SUBJECTIVE STATEMENT: No complaints today in shoulder.    Patient presents with acute right sided shoulder pain referred by Dr. Lukes for approximately... The pain is located in the [name], top and front.  He feels pain with reaching , Lying Rt side, reaching back.  No radiation of symptoms No weakness tingling yeah just from positioning yeah yeah Min neck pain just in the last week or 2.   Patient is limited in his ability to sleep, home tasks, going to the gym.  He can't pull the mower with his Rt arm. He stopped going due to this.   He currently plays pickle ball He would like to be able to continue being active and reduce pain .    Hand dominance: Left handed   PERTINENT HISTORY: Allergies anxiety arthritis depression diabetes GERD history of CVA peripheral vascular disease high blood pressure  PAIN:  Are you having pain? Yes: NPRS scale: no pain Pain location: Rt superior shoulder Pain description: dull ache  Aggravating factors: Reaching back out and overhead, sleeping on right shoulder Relieving factors: no meds, changing positions  PRECAUTIONS: None  RED FLAGS: None   WEIGHT BEARING RESTRICTIONS: No  FALLS:  Has patient fallen in last 6 months? No  LIVING ENVIRONMENT: Lives with: lives with their spouse Lives in: House/apartment Stairs: no issues  Has following equipment at home: None  OCCUPATION:   PLOF: Independent, Vocation/Vocational requirements: reitred , and Leisure: tax adviser, diplomatic services operational officer, active   PATIENT GOALS: Reduce pain be able to lift weights or do what ever he wants with his right arm  NEXT MD VISIT:   OBJECTIVE:  Note: Objective measures were completed at Evaluation unless  otherwise noted.  DIAGNOSTIC FINDINGS:  None  PATIENT SURVEYS:  Quick Dash: 18  COGNITION: Overall cognitive status: Within functional limits for tasks assessed     SENSATION: WFL  POSTURE: Slightly rounded upper back with right glenohumeral internal rotation  UPPER EXTREMITY ROM:   Active ROM Right eval Left eval Rt  01/13/25  Shoulder flexion 124 pain  Passive to 140 with pain  160 132 Discomfort   Shoulder extension 55    Shoulder abduction 90 pain  Passive to 110 pain  160 120  Shoulder adduction     Shoulder internal rotation FR TL spine   Supine AROM 60 with abd  FR mid T  Mid T   Shoulder external rotation FR T1-2   Supine AROM 55 deg with abd  FR T3 T1    Passive ROM limited in flexion, abduction, ER and IR end range  IR 55 with pain , ER 60 with pain    UPPER EXTREMITY MMT:  MMT Right eval Left eval  Shoulder flexion   4+ 5  Shoulder extension 4+ 4+  Shoulder abduction 4+ 5  Shoulder adduction    Shoulder internal rotation 5 5  Shoulder external rotation 5 5  Middle trapezius 4+ 5  Lower trapezius    Elbow flexion    Elbow extension    Wrist flexion    Wrist extension    Wrist ulnar deviation    Wrist radial deviation    Wrist pronation    Wrist supination    Grip strength (lbs)    (Blank rows = not tested)  Patient with good scapular retraction elevation and depression  SHOULDER SPECIAL TESTS: Impingement tests: Neer impingement test: negative Neg painful arc  SLAP lesions: NT  Instability tests: NT  Rotator cuff assessment: Empty can test: negative Biceps assessment: NT   JOINT MOBILITY TESTING:  Stiffness in Rt GH joint   PALPATION:  Min TTP Rt ant, superior shoulder, no  pain posteriorly, post cuff or med scap border  TREATMENT DATE:    [facility] Adult PT Treatment:                                                 DATE: 01/13/25 Therapeutic Exercise: UBE 2 min each direction Row blue  Extension blue  Horizontal abd blue  ER blue band  Diagonal pull blue  Bicep curl 10 lbs hammer Shoulder raises FW 7 lbs and lateral 5 lbs  Bench row 10 lbs- 15 lbs x 10 each arm  Supine shoulder mobility ROM PROM  Self care Form with gym exercises , posture and setting shoulder , control, wgt recommendation    [facility] Adult PT Treatment:                                                DATE: 01/10/25 Therapeutic Exercise: Standing row narrow and wide blue band  Standing extension blue band  External rot. Blue  ER with shoulder flexion green  x 10  Diagonal green band standing x 10  Dowel AAROM Sleeper stretch x 10 sec x 5  Sidelying scaption and flexion 3 lbs x 15 each  PROM , light distraction and MMT check       12/15/2024 PT evaluation completed patient given exercises to perform at home discussed evaluation findings plan of care and symptom management   PATIENT EDUCATION: Education details: updated and reviewed HEP Person educated: Patient Education method: Explanation, Demonstration, and Handouts Education comprehension: verbalized understanding, verbal cues required, tactile cues required, and needs further education  HOME EXERCISE PROGRAM: Access Code: [email or web address] URL: [email or web address] Date: 12/15/2024 Prepared by: Delon Norma  Exercises - Standing Shoulder Flexion AAROM with Dowel  - 1 x daily - 7 x weekly - 2 sets - 10 reps - 10 hold - Standing Shoulder Abduction AAROM with Dowel  - 1 x daily - 7 x weekly - 2 sets - 10 reps - 10 hold - Standing Shoulder Horizontal Abduction with Resistance  - 1 x daily - 7 x weekly - 2 sets - 10 reps - 5 hold - Shoulder extension with resistance - Neutral  - 1 x daily - 7 x weekly - 2 sets - 10 reps - 5 hold - Standing Shoulder Row with Anchored Resistance  - 1 x daily - 7 x weekly - 2 sets - 10 reps - 5  hold  ASSESSMENT:  CLINICAL IMPRESSION:  Patient has improved shoulder mobility in both flexion and abduction.  He continues to have min to moderate pain.  Today we reviewed dumbbell exercises for him to continue doing at the gym and focused on form control and strength training principles.  He plans to pick up a couple of light weights this weekend to work on some of this at home.  Added to his home program as well.  He will continue to benefit from skilled PT to achieve his functional goals  Eval: Patient is a 69y.o. male who was seen today for physical therapy evaluation and treatment for right sided shoulder pain.  Symptoms are consistent with possible mild osteoarthritis but no concern for rotator cuff tear or instability.  OBJECTIVE IMPAIRMENTS: decreased coordination, decreased knowledge of condition, decreased mobility, decreased ROM, decreased strength, hypomobility, increased fascial restrictions,  impaired flexibility, impaired UE functional use, and pain.   ACTIVITY LIMITATIONS: carrying, lifting, sleeping, dressing, and reach over head  PARTICIPATION LIMITATIONS: cleaning, community activity, and yard work  PERSONAL FACTORS: Time since onset of injury/illness/exacerbation and 3+ comorbidities: Previous stroke, diabetes, depression are also affecting patient's functional outcome.   REHAB POTENTIAL: Excellent  CLINICAL DECISION MAKING: Stable/uncomplicated  EVALUATION COMPLEXITY: Low   GOALS: Goals reviewed with patient? Yes  SHORT TERM GOALS: Target date: 12/29/2024    Patient will be independent with initial home exercise program for shoulder mobility and strength Baseline: Goal status: MET   2.  Patient will improve right shoulder flexion, abduction with active assisted range of motion by 15 degrees Baseline:  Goal status: INITIAL   LONG TERM GOALS: Target date: 02/09/2025  Patient will be independent with final HEP upon discharge from PT and report consistent  benefit following exercise completion.    Baseline:  Goal status:ongoing   2.  Patient will be able to demonstrate shoulder strength to 5/5 in all planes in order to maximize functional use of UEs for ADLs Baseline:  Goal status: ongoing   3.  Patient will resume all resistance training at the gym without increased shoulder pain, modifications  Baseline:  Goal status: ongoing   4. Patient will be able to demonstrate proper posture and lifting techniques related to shoulder pain and reduction of symptoms.   Baseline:  Goal status: INITIAL  5.  Quick dash score will improve to 10 or less  Baseline: 16 Goal status: INITIAL   PLAN:  PT FREQUENCY: 1-2x/week  PT DURATION: 8 weeks  PLANNED INTERVENTIONS: 97164- PT Re-evaluation, 97750- Physical Performance Testing, 97110-Therapeutic exercises, 97530- Therapeutic activity, 97112- Neuromuscular re-education, 97535- Self Care, 02859- Manual therapy, Patient/Family education, Joint mobilization, Cryotherapy, and Moist heat  PLAN FOR NEXT SESSION: Check ROM, Manual ,posture, gym /DB exercises  Continue prom, aarom, to improve stiffness.     Delon Norma, PT 01/13/25 10:30 AM Phone: [phone] Fax: [phone]  "

## 2025-01-17 ENCOUNTER — Encounter: Payer: Self-pay | Admitting: Physical Therapy

## 2025-01-17 ENCOUNTER — Ambulatory Visit: Admitting: Physical Therapy

## 2025-01-17 DIAGNOSIS — M25611 Stiffness of right shoulder, not elsewhere classified: Secondary | ICD-10-CM

## 2025-01-17 DIAGNOSIS — M25511 Pain in right shoulder: Secondary | ICD-10-CM

## 2025-01-17 DIAGNOSIS — G8929 Other chronic pain: Secondary | ICD-10-CM

## 2025-01-17 NOTE — Therapy (Signed)
 " OUTPATIENT PHYSICAL THERAPY SHOULDER TREATMENT   Patient Name: Darrell Gaines MRN: [id number] DOB:[birth year], 69 y.o., male Today's Date: 01/17/2025  END OF SESSION:  PT End of Session - 01/17/25 0941     Visit Number 6    Number of Visits 12    Date for Recertification  02/09/25    Authorization Type BCBS-no auth needed for rehab    PT Start Time 0935    PT Stop Time 1015    PT Time Calculation (min) 40 min    Activity Tolerance Patient tolerated treatment well    Behavior During Therapy [facility] for tasks assessed/performed             Past Medical History:  Diagnosis Date   Allergy    Anxiety    Arthritis    Depression (disease)    Diabetes mellitus (HCC)    Erosive esophagitis    GERD (gastroesophageal reflux disease)    Hemorrhoids    History of cerebrovascular accident    Hyperlipidemia    Peripheral vascular disease    Stroke (HCC)    2001   Subclavian artery stenosis, right    Tubular adenoma polyp of rectum 07/2010   Past Surgical History:  Procedure Laterality Date   APPENDECTOMY  1967   colon polyp removal  12/29/1965   COLON SURGERY  1967   Polp removal   COLONOSCOPY     Patient Active Problem List   Diagnosis Date Noted   Recurrent major depressive disorder, in full remission 05/16/2022   Subclavian artery stenosis, right 05/04/2018   Visual disturbance 01/07/2018   Chondromalacia 11/05/2017   History of adenomatous polyp of colon 04/24/2016   BPH associated with nocturia 04/24/2016   Type II diabetes mellitus, well controlled (HCC) 12/11/2014   Depression 08/21/2008   Hyperlipidemia 07/26/2007   GERD 07/26/2007   History of CVA (cerebrovascular accident) 07/26/2007    PCP: Dr. Garnette Lukes  REFERRING PROVIDER: Dr. Garnette Lukes  REFERRING DIAG: Acute right sided shoulder pain  THERAPY DIAG:  Chronic right shoulder pain  Stiffness of right shoulder, not elsewhere classified  Rationale for Evaluation and Treatment:  Rehabilitation  ONSET DATE: 3+ mos , may have gotten better then worse   SUBJECTIVE:                                                                                                                                                                                      SUBJECTIVE STATEMENT:   No complaints!    Patient presents with acute right sided shoulder pain referred by Dr. Lukes for approximately... The pain is located in the [name], top and front.  He feels pain with reaching , Lying Rt side, reaching back.  No radiation of symptoms No weakness tingling yeah just from positioning yeah yeah Min neck pain just in the last week or 2.   Patient is limited in his ability to sleep, home tasks, going to the gym.  He can't pull the mower with his Rt arm. He stopped going due to this.   He currently plays pickle ball He would like to be able to continue being active and reduce pain .    Hand dominance: Left handed   PERTINENT HISTORY: Allergies anxiety arthritis depression diabetes GERD history of CVA peripheral vascular disease high blood pressure  PAIN:  Are you having pain? Yes: NPRS scale: no pain Pain location: Rt superior shoulder Pain description: dull ache  Aggravating factors: Reaching back out and overhead, sleeping on right shoulder Relieving factors: no meds, changing positions  PRECAUTIONS: None  RED FLAGS: None   WEIGHT BEARING RESTRICTIONS: No  FALLS:  Has patient fallen in last 6 months? No  LIVING ENVIRONMENT: Lives with: lives with their spouse Lives in: House/apartment Stairs: no issues  Has following equipment at home: None  OCCUPATION:   PLOF: Independent, Vocation/Vocational requirements: reitred , and Leisure: tax adviser, diplomatic services operational officer, active   PATIENT GOALS: Reduce pain be able to lift weights or do what ever he wants with his right arm  NEXT MD VISIT:   OBJECTIVE:  Note: Objective measures were completed at Evaluation unless otherwise  noted.  DIAGNOSTIC FINDINGS:  None  PATIENT SURVEYS:  Quick Dash: 18  COGNITION: Overall cognitive status: Within functional limits for tasks assessed     SENSATION: WFL  POSTURE: Slightly rounded upper back with right glenohumeral internal rotation  UPPER EXTREMITY ROM:   Active ROM Right eval Left eval Rt  01/13/25  Shoulder flexion 124 pain  Passive to 140 with pain  160 132 Discomfort   Shoulder extension 55    Shoulder abduction 90 pain  Passive to 110 pain  160 120  Shoulder adduction     Shoulder internal rotation FR TL spine   Supine AROM 60 with abd  FR mid T  Mid T   Shoulder external rotation FR T1-2   Supine AROM 55 deg with abd  FR T3 T1    Passive ROM limited in flexion, abduction, ER and IR end range  IR 55 with pain , ER 60 with pain    UPPER EXTREMITY MMT:  MMT Right eval Left eval  Shoulder flexion   4+ 5  Shoulder extension 4+ 4+  Shoulder abduction 4+ 5  Shoulder adduction    Shoulder internal rotation 5 5  Shoulder external rotation 5 5  Middle trapezius 4+ 5  Lower trapezius    Elbow flexion    Elbow extension    Wrist flexion    Wrist extension    Wrist ulnar deviation    Wrist radial deviation    Wrist pronation    Wrist supination    Grip strength (lbs)    (Blank rows = not tested)  Patient with good scapular retraction elevation and depression  SHOULDER SPECIAL TESTS: Impingement tests: Neer impingement test: negative Neg painful arc  SLAP lesions: NT  Instability tests: NT  Rotator cuff assessment: Empty can test: negative Biceps assessment: NT   JOINT MOBILITY TESTING:  Stiffness in Rt GH joint   PALPATION:  Min TTP Rt ant, superior shoulder, no  pain posteriorly, post cuff or med scap border  TREATMENT DATE:   [facility] Adult PT Treatment:                                                 DATE: 01/17/25 Therapeutic Exercise: UBE 5 min  Standing hanging stretch for flexion, cross body stretch and abduction (capsule stretch)  Supine 5 lbs DB : narrow press , tricep skull crusher, chest fly  Sidelying ER , 5 lbs 2 x 8 reps  Sidelying scaption  blue band , 5 lbs x 10  Horizontal abduction blue band x 15   Therapeutic Activity: Pilates Tower standing  Under hand row  Extension cues needed  Diagonal pull (D1/D2) yellow  Chest press Chest fly double then single   [facility] Adult PT Treatment:                                                DATE: 01/13/25 Therapeutic Exercise: UBE 2 min each direction Row blue  Extension blue  Horizontal abd blue  ER blue band  Diagonal pull blue  Bicep curl 10 lbs hammer Shoulder raises FW 7 lbs and lateral 5 lbs  Bench row 10 lbs- 15 lbs x 10 each arm  Supine shoulder mobility ROM PROM  Self care Form with gym exercises , posture and setting shoulder , control, wgt recommendation    [facility] Adult PT Treatment:                                                DATE: 01/10/25 Therapeutic Exercise: Standing row narrow and wide blue band  Standing extension blue band  External rot. Blue  ER with shoulder flexion green  x 10  Diagonal green band standing x 10  Dowel AAROM Sleeper stretch x 10 sec x 5  Sidelying scaption and flexion 3 lbs x 15 each  PROM , light distraction and MMT check       12/15/2024 PT evaluation completed patient given exercises to perform at home discussed evaluation findings plan of care and symptom management   PATIENT EDUCATION: Education details: updated and reviewed HEP Person educated: Patient Education method: Explanation, Demonstration, and Handouts Education comprehension: verbalized understanding, verbal cues required, tactile cues required, and needs further education  HOME EXERCISE PROGRAM: Access Code: [email or web address] URL: [email or web address] Date: 12/15/2024 Prepared by: Delon Norma  Exercises - Standing Shoulder Flexion AAROM with Dowel  - 1 x daily - 7 x weekly - 2 sets - 10 reps - 10 hold - Standing Shoulder Abduction AAROM with Dowel  - 1 x daily - 7 x weekly - 2 sets - 10 reps - 10 hold - Standing Shoulder Horizontal Abduction with Resistance  - 1 x daily - 7 x weekly - 2 sets - 10 reps - 5 hold - Shoulder extension with resistance - Neutral  - 1 x daily - 7 x weekly - 2 sets - 10 reps - 5 hold - Standing Shoulder Row with Anchored Resistance  - 1 x daily - 7 x weekly - 2 sets - 10 reps - 5 hold  ASSESSMENT:  CLINICAL IMPRESSION: Pt tolerated  the session well today.  Patient purchased 5 lbs dumbbells so we integrated that into his session today. He did nt complain of pain with exercises.  He has more session scheduled and at that time he will be ready to carry on independently.   Eval: Patient is a 69y.o. male who was seen today for physical therapy evaluation and treatment for right sided shoulder pain.  Symptoms are consistent with possible mild osteoarthritis but no concern for rotator cuff tear or instability.  OBJECTIVE IMPAIRMENTS: decreased coordination, decreased knowledge of condition, decreased mobility, decreased ROM, decreased strength, hypomobility, increased fascial restrictions, impaired flexibility, impaired UE functional use, and pain.   ACTIVITY LIMITATIONS: carrying, lifting, sleeping, dressing, and reach over head  PARTICIPATION LIMITATIONS: cleaning, community activity, and yard work  PERSONAL FACTORS: Time since onset of injury/illness/exacerbation and 3+ comorbidities: Previous stroke, diabetes, depression are also affecting patient's functional outcome.   REHAB POTENTIAL: Excellent  CLINICAL DECISION MAKING: Stable/uncomplicated  EVALUATION COMPLEXITY: Low   GOALS: Goals reviewed with patient? Yes  SHORT TERM GOALS: Target date: 12/29/2024    Patient will be independent with initial home exercise program for shoulder mobility  and strength Baseline: Goal status: MET   2.  Patient will improve right shoulder flexion, abduction with active assisted range of motion by 15 degrees Baseline: see above, improved 8 deg in flexion, 30 deg in abduction Goal status: ongoing    LONG TERM GOALS: Target date: 02/09/2025  Patient will be independent with final HEP upon discharge from PT and report consistent benefit following exercise completion.    Baseline:  Goal status: ongoing   2.  Patient will be able to demonstrate shoulder strength to 5/5 in all planes in order to maximize functional use of UEs for ADLs Baseline:  Goal status: ongoing   3.  Patient will resume all resistance training at the gym without increased shoulder pain, modifications  Baseline: has dumbbells and modifies at the gym  Goal status:MET   4. Patient will be able to demonstrate proper posture and lifting techniques related to shoulder pain and reduction of symptoms.   Baseline:  Goal status: ongoing   5.  Quick dash score will improve to 10 or less  Baseline: 16 Goal status: ongoing    PLAN:  PT FREQUENCY: 1-2x/week  PT DURATION: 8 weeks  PLANNED INTERVENTIONS: 97164- PT Re-evaluation, 97750- Physical Performance Testing, 97110-Therapeutic exercises, 97530- Therapeutic activity, 97112- Neuromuscular re-education, 97535- Self Care, 02859- Manual therapy, Patient/Family education, Joint mobilization, Cryotherapy, and Moist heat  PLAN FOR NEXT SESSION: Check ROM, Manual ,posture, gym /DB exercises  Continue prom, aarom, to improve stiffness.     Delon Norma, PT 01/17/25 10:15 AM Phone: [phone] Fax: [phone]  "

## 2025-01-19 ENCOUNTER — Ambulatory Visit: Admitting: Physical Therapy

## 2025-01-19 ENCOUNTER — Encounter: Payer: Self-pay | Admitting: Physical Therapy

## 2025-01-19 DIAGNOSIS — M25511 Pain in right shoulder: Secondary | ICD-10-CM | POA: Diagnosis not present

## 2025-01-19 DIAGNOSIS — G8929 Other chronic pain: Secondary | ICD-10-CM

## 2025-01-19 DIAGNOSIS — M25611 Stiffness of right shoulder, not elsewhere classified: Secondary | ICD-10-CM | POA: Diagnosis not present

## 2025-01-19 NOTE — Therapy (Signed)
 " OUTPATIENT PHYSICAL THERAPY SHOULDER TREATMENT   Patient Name: Darrell Gaines MRN: [id number] DOB:Nov 01, 1956, 69 y.o., male Today's Date: 01/19/2025  END OF SESSION:  PT End of Session - 01/19/25 0936     Visit Number 7    Number of Visits 12    Date for Recertification  02/09/25    Authorization Type BCBS-no auth needed for rehab    PT Start Time 0932    PT Stop Time 1015    PT Time Calculation (min) 43 min    Activity Tolerance Patient tolerated treatment well    Behavior During Therapy [facility] for tasks assessed/performed              Past Medical History:  Diagnosis Date   Allergy    Anxiety    Arthritis    Depression (disease)    Diabetes mellitus (HCC)    Erosive esophagitis    GERD (gastroesophageal reflux disease)    Hemorrhoids    History of cerebrovascular accident    Hyperlipidemia    Peripheral vascular disease    Stroke (HCC)    2001   Subclavian artery stenosis, right    Tubular adenoma polyp of rectum 07/2010   Past Surgical History:  Procedure Laterality Date   APPENDECTOMY  1967   colon polyp removal  12/29/1965   COLON SURGERY  1967   Polp removal   COLONOSCOPY     Patient Active Problem List   Diagnosis Date Noted   Recurrent major depressive disorder, in full remission 05/16/2022   Subclavian artery stenosis, right 05/04/2018   Visual disturbance 01/07/2018   Chondromalacia 11/05/2017   History of adenomatous polyp of colon 04/24/2016   BPH associated with nocturia 04/24/2016   Type II diabetes mellitus, well controlled (HCC) 12/11/2014   Depression 08/21/2008   Hyperlipidemia 07/26/2007   GERD 07/26/2007   History of CVA (cerebrovascular accident) 07/26/2007    PCP: Dr. Garnette Lukes  REFERRING PROVIDER: Dr. Garnette Lukes  REFERRING DIAG: Acute right sided shoulder pain  THERAPY DIAG:  Chronic right shoulder pain  Stiffness of right shoulder, not elsewhere classified  Rationale for Evaluation and Treatment:  Rehabilitation  ONSET DATE: 3+ mos , may have gotten better then worse   SUBJECTIVE:                                                                                                                                                                                      SUBJECTIVE STATEMENT:   Not hurting.   Patient presents with acute right sided shoulder pain referred by Dr. Lukes for approximately... The pain is located in the [name], top and front.  He feels pain with reaching , Lying Rt side, reaching back.  No radiation of symptoms No weakness tingling yeah just from positioning yeah yeah Min neck pain just in the last week or 2.   Patient is limited in his ability to sleep, home tasks, going to the gym.  He can't pull the mower with his Rt arm. He stopped going due to this.   He currently plays pickle ball He would like to be able to continue being active and reduce pain .    Hand dominance: Left handed   PERTINENT HISTORY: Allergies anxiety arthritis depression diabetes GERD history of CVA peripheral vascular disease high blood pressure  PAIN:  Are you having pain? Yes: NPRS scale: no pain Pain location: Rt superior shoulder Pain description: dull ache  Aggravating factors: Reaching back out and overhead, sleeping on right shoulder Relieving factors: no meds, changing positions  PRECAUTIONS: None  RED FLAGS: None   WEIGHT BEARING RESTRICTIONS: No  FALLS:  Has patient fallen in last 6 months? No  LIVING ENVIRONMENT: Lives with: lives with their spouse Lives in: House/apartment Stairs: no issues  Has following equipment at home: None  OCCUPATION:   PLOF: Independent, Vocation/Vocational requirements: reitred , and Leisure: tax adviser, diplomatic services operational officer, active   PATIENT GOALS: Reduce pain be able to lift weights or do what ever he wants with his right arm  NEXT MD VISIT:   OBJECTIVE:  Note: Objective measures were completed at Evaluation unless otherwise  noted.  DIAGNOSTIC FINDINGS:  None  PATIENT SURVEYS:  Quick Dash: 18  COGNITION: Overall cognitive status: Within functional limits for tasks assessed     SENSATION: WFL  POSTURE: Slightly rounded upper back with right glenohumeral internal rotation  UPPER EXTREMITY ROM:   Active ROM Right eval Left eval Rt  01/13/25  Shoulder flexion 124 pain  Passive to 140 with pain  160 132 Discomfort   Shoulder extension 55    Shoulder abduction 90 pain  Passive to 110 pain  160 120  Shoulder adduction     Shoulder internal rotation FR TL spine   Supine AROM 60 with abd  FR mid T  Mid T   Shoulder external rotation FR T1-2   Supine AROM 55 deg with abd  FR T3 T1    Passive ROM limited in flexion, abduction, ER and IR end range  IR 55 with pain , ER 60 with pain    UPPER EXTREMITY MMT:  MMT Right eval Left eval  Shoulder flexion   4+ 5  Shoulder extension 4+ 4+  Shoulder abduction 4+ 5  Shoulder adduction    Shoulder internal rotation 5 5  Shoulder external rotation 5 5  Middle trapezius 4+ 5  Lower trapezius    Elbow flexion    Elbow extension    Wrist flexion    Wrist extension    Wrist ulnar deviation    Wrist radial deviation    Wrist pronation    Wrist supination    Grip strength (lbs)    (Blank rows = not tested)  Patient with good scapular retraction elevation and depression  SHOULDER SPECIAL TESTS: Impingement tests: Neer impingement test: negative Neg painful arc  SLAP lesions: NT  Instability tests: NT  Rotator cuff assessment: Empty can test: negative Biceps assessment: NT   JOINT MOBILITY TESTING:  Stiffness in Rt GH joint   PALPATION:  Min TTP Rt ant, superior shoulder, no  pain posteriorly, post cuff or med scap border  TREATMENT DATE:  [facility] Adult PT Treatment:                                                 DATE: 01/19/25 Therapeutic Exercise: Standing band ex: Standing DB exercises   Forward raise   Overhead    Lateral raise   Corner stretch /doorway   Sidelying ER and scaption 5 lbs x 10 each   Sidelying thoracic rotation with blue band pull Rt  Sleeper Stretch  Supine 5 lbs flexion  Lat pull over 10 lbs x 10  pas   [facility] Adult PT Treatment:                                                DATE: 01/17/25 Therapeutic Exercise: UBE 5 min  Standing hanging stretch for flexion, cross body stretch and abduction (capsule stretch)  Supine 5 lbs DB : narrow press , tricep skull crusher, chest fly  Sidelying ER , 5 lbs 2 x 8 reps  Sidelying scaption  blue band , 5 lbs x 10  Horizontal abduction blue band x 15   Therapeutic Activity: Pilates Tower standing  Under hand row  Extension cues needed  Diagonal pull (D1/D2) yellow  Chest press Chest fly double then single   [facility] Adult PT Treatment:                                                DATE: 01/13/25 Therapeutic Exercise: UBE 2 min each direction Row blue  Extension blue  Horizontal abd blue  ER blue band  Diagonal pull blue  Bicep curl 10 lbs hammer Shoulder raises FW 7 lbs and lateral 5 lbs  Bench row 10 lbs- 15 lbs x 10 each arm  Supine shoulder mobility ROM PROM  Self care Form with gym exercises , posture and setting shoulder , control, wgt recommendation    [facility] Adult PT Treatment:                                                DATE: 01/10/25 Therapeutic Exercise: Standing row narrow and wide blue band  Standing extension blue band  External rot. Blue  ER with shoulder flexion green  x 10  Diagonal green band standing x 10  Dowel AAROM Sleeper stretch x 10 sec x 5  Sidelying scaption and flexion 3 lbs x 15 each  PROM , light distraction and MMT check       12/15/2024 PT evaluation completed patient given exercises to perform at home discussed evaluation findings plan of care and symptom management   PATIENT  EDUCATION: Education details: updated and reviewed HEP Person educated: Patient Education method: Explanation, Demonstration, and Handouts Education comprehension: verbalized understanding, verbal cues required, tactile cues required, and needs further education  HOME EXERCISE PROGRAM: Access Code: [email or web address] URL: [email or web address] Date: 12/15/2024 Prepared by: Delon Norma  Exercises - Standing Shoulder Flexion AAROM with Dowel  - 1 x daily - 7  x weekly - 2 sets - 10 reps - 10 hold - Standing Shoulder Abduction AAROM with Dowel  - 1 x daily - 7 x weekly - 2 sets - 10 reps - 10 hold - Standing Shoulder Horizontal Abduction with Resistance  - 1 x daily - 7 x weekly - 2 sets - 10 reps - 5 hold - Shoulder extension with resistance - Neutral  - 1 x daily - 7 x weekly - 2 sets - 10 reps - 5 hold - Standing Shoulder Row with Anchored Resistance  - 1 x daily - 7 x weekly - 2 sets - 10 reps - 5 hold  ASSESSMENT:  CLINICAL IMPRESSION: Patient had no pain during his session and tolerated light dumbbells and bands in different positions.  He had a little bit of discomfort with overhead press 5 pounds.  I discussed that is not necessarily dangerous to do this exercise but it is good to check in as it is a functional shoulder pattern.  Continue plan of care he has 2 more visits and should be ready for discharge at that time.   Eval: Patient is a 69y.o. male who was seen today for physical therapy evaluation and treatment for right sided shoulder pain.  Symptoms are consistent with possible mild osteoarthritis but no concern for rotator cuff tear or instability.  OBJECTIVE IMPAIRMENTS: decreased coordination, decreased knowledge of condition, decreased mobility, decreased ROM, decreased strength, hypomobility, increased fascial restrictions, impaired flexibility, impaired UE functional use, and pain.   ACTIVITY LIMITATIONS: carrying, lifting, sleeping, dressing, and reach over  head  PARTICIPATION LIMITATIONS: cleaning, community activity, and yard work  PERSONAL FACTORS: Time since onset of injury/illness/exacerbation and 3+ comorbidities: Previous stroke, diabetes, depression are also affecting patient's functional outcome.   REHAB POTENTIAL: Excellent  CLINICAL DECISION MAKING: Stable/uncomplicated  EVALUATION COMPLEXITY: Low   GOALS: Goals reviewed with patient? Yes  SHORT TERM GOALS: Target date: 12/29/2024    Patient will be independent with initial home exercise program for shoulder mobility and strength Baseline: Goal status: MET   2.  Patient will improve right shoulder flexion, abduction with active assisted range of motion by 15 degrees Baseline: see above, improved 8 deg in flexion, 30 deg in abduction Goal status: ongoing    LONG TERM GOALS: Target date: 02/09/2025  Patient will be independent with final HEP upon discharge from PT and report consistent benefit following exercise completion.    Baseline:  Goal status: ongoing   2.  Patient will be able to demonstrate shoulder strength to 5/5 in all planes in order to maximize functional use of UEs for ADLs Baseline:  Goal status: ongoing   3.  Patient will resume all resistance training at the gym without increased shoulder pain, modifications  Baseline: has dumbbells and modifies at the gym  Goal status:MET   4. Patient will be able to demonstrate proper posture and lifting techniques related to shoulder pain and reduction of symptoms.   Baseline:  Goal status: ongoing   5.  Quick dash score will improve to 10 or less  Baseline: 16 Goal status: ongoing    PLAN:  PT FREQUENCY: 1-2x/week  PT DURATION: 8 weeks  PLANNED INTERVENTIONS: 97164- PT Re-evaluation, 97750- Physical Performance Testing, 97110-Therapeutic exercises, 97530- Therapeutic activity, [id number]- Neuromuscular re-education, 97535- Self Care, 02859- Manual therapy, Patient/Family education, Joint mobilization,  Cryotherapy, and Moist heat  PLAN FOR NEXT SESSION: Continue to progress strength within tolerance addressing impingement    Delon Norma, PT 01/19/25 9:41 AM Phone:  [phone] Fax: [phone]  "

## 2025-01-24 ENCOUNTER — Encounter: Admitting: Physical Therapy

## 2025-01-26 ENCOUNTER — Encounter: Payer: Self-pay | Admitting: Physical Therapy

## 2025-01-26 ENCOUNTER — Ambulatory Visit: Admitting: Physical Therapy

## 2025-01-26 DIAGNOSIS — G8929 Other chronic pain: Secondary | ICD-10-CM | POA: Diagnosis not present

## 2025-01-26 DIAGNOSIS — M25611 Stiffness of right shoulder, not elsewhere classified: Secondary | ICD-10-CM

## 2025-01-26 DIAGNOSIS — M25511 Pain in right shoulder: Secondary | ICD-10-CM

## 2025-01-26 NOTE — Therapy (Signed)
 " OUTPATIENT PHYSICAL THERAPY SHOULDER TREATMENT   Patient Name: Darrell Gaines MRN: [id number] DOB:[birth year], 69 y.o., male Today's Date: 01/26/2025    PHYSICAL THERAPY DISCHARGE SUMMARY  Visits from Start of Care: 8  Current functional level related to goals / functional outcomes: See below, no issues    Remaining deficits: None limiting function, slight decrease in overhead flexion   Education / Equipment: HEP, posture, lifting, form with exercises   Patient agrees to discharge. Patient goals were met. Patient is being discharged due to meeting the stated rehab goals.    END OF SESSION:  PT End of Session - 01/26/25 0938     Visit Number 8    Number of Visits 12    Date for Recertification  02/09/25    Authorization Type [employer]-no auth needed for rehab    PT Start Time 0930    PT Stop Time 1015    PT Time Calculation (min) 45 min    Activity Tolerance Patient tolerated treatment well    Behavior During Therapy [facility] for tasks assessed/performed              Past Medical History:  Diagnosis Date   Allergy    Anxiety    Arthritis    Depression (disease)    Diabetes mellitus (HCC)    Erosive esophagitis    GERD (gastroesophageal reflux disease)    Hemorrhoids    History of cerebrovascular accident    Hyperlipidemia    Peripheral vascular disease    Stroke (HCC)    2001   Subclavian artery stenosis, right    Tubular adenoma polyp of rectum 07/2010   Past Surgical History:  Procedure Laterality Date   APPENDECTOMY  1967   colon polyp removal  12/29/1965   COLON SURGERY  1967   Polp removal   COLONOSCOPY     Patient Active Problem List   Diagnosis Date Noted   Recurrent major depressive disorder, in full remission 05/16/2022   Subclavian artery stenosis, right 05/04/2018   Visual disturbance 01/07/2018   Chondromalacia 11/05/2017   History of adenomatous polyp of colon 04/24/2016   BPH associated with nocturia 04/24/2016   Type II diabetes  mellitus, well controlled (HCC) 12/11/2014   Depression 08/21/2008   Hyperlipidemia 07/26/2007   GERD 07/26/2007   History of CVA (cerebrovascular accident) 07/26/2007    PCP: Dr. Garnette Lukes  REFERRING PROVIDER: Dr. Garnette Lukes  REFERRING DIAG: Acute right sided shoulder pain  THERAPY DIAG:  Chronic right shoulder pain  Stiffness of right shoulder, not elsewhere classified  Rationale for Evaluation and Treatment: Rehabilitation  ONSET DATE: 3+ mos , may have gotten better then worse   SUBJECTIVE:  SUBJECTIVE STATEMENT:   No pain in shoulder today., recalls discomfort at times.  Not like the dull ache like it was.      Patient presents with acute right sided shoulder pain referred by Dr. Katrinka for approximately... The pain is located in the [name], top and front.  He feels pain with reaching , Lying Rt side, reaching back.  No radiation of symptoms No weakness tingling yeah just from positioning yeah yeah Min neck pain just in the last week or 2.   Patient is limited in his ability to sleep, home tasks, going to the gym.  He can't pull the mower with his Rt arm. He stopped going due to this.   He currently plays pickle ball He would like to be able to continue being active and reduce pain .    Hand dominance: Left handed   PERTINENT HISTORY: Allergies anxiety arthritis depression diabetes GERD history of CVA peripheral vascular disease high blood pressure  PAIN:  Are you having pain? Yes: NPRS scale: no pain Pain location: Rt superior shoulder Pain description: dull ache  Aggravating factors: Reaching back out and overhead, sleeping on right shoulder Relieving factors: no meds, changing positions  PRECAUTIONS: None  RED FLAGS: None   WEIGHT BEARING RESTRICTIONS:  No  FALLS:  Has patient fallen in last 6 months? No  LIVING ENVIRONMENT: Lives with: lives with their spouse Lives in: House/apartment Stairs: no issues  Has following equipment at home: None  OCCUPATION:   PLOF: Independent, Vocation/Vocational requirements: reitred , and Leisure: tax adviser, diplomatic services operational officer, active   PATIENT GOALS: Reduce pain be able to lift weights or do what ever he wants with his right arm  NEXT MD VISIT:   OBJECTIVE:  Note: Objective measures were completed at Evaluation unless otherwise noted.  DIAGNOSTIC FINDINGS:  None  PATIENT SURVEYS:  Quick Dash: 18 Quick DASH = 9%  COGNITION: Overall cognitive status: Within functional limits for tasks assessed     SENSATION: WFL  POSTURE: Slightly rounded upper back with right glenohumeral internal rotation  UPPER EXTREMITY ROM:   Active ROM Right eval Left eval Rt  01/13/25  Shoulder flexion 124 pain  Passive to 140 with pain  160 132 Discomfort   Shoulder extension 55    Shoulder abduction 90 pain  Passive to 110 pain  160 120  Shoulder adduction     Shoulder internal rotation FR TL spine   Supine AROM 60 with abd  FR mid T  Mid T   Shoulder external rotation FR T1-2   Supine AROM 55 deg with abd  FR T3 T1    Passive ROM limited in flexion, abduction, ER and IR end range  IR 55 with pain , ER 60 with pain    UPPER EXTREMITY MMT:  MMT Right eval Left eval Rt 01/26/25  Shoulder flexion   4+ 5 5  Shoulder extension 4+ 4+ 5  Shoulder abduction 4+ 5   Shoulder adduction     Shoulder internal rotation 5 5 5   Shoulder external rotation 5 5 5   Middle trapezius 4+ 5   Lower trapezius     Elbow flexion     Elbow extension     Wrist flexion     Wrist extension     Wrist ulnar deviation     Wrist radial deviation     Wrist pronation     Wrist supination     Grip strength (lbs)     (Blank rows = not tested)  Patient with good scapular retraction elevation and depression  SHOULDER  SPECIAL TESTS: Impingement tests: Neer impingement test: negative Neg painful arc  SLAP lesions: NT  Instability tests: NT  Rotator cuff assessment: Empty can test: negative Biceps assessment: NT   JOINT MOBILITY TESTING:  Stiffness in Rt GH joint   PALPATION:  Min TTP Rt ant, superior shoulder, no  pain posteriorly, post cuff or med scap border                                                                                                                               TREATMENT DATE:   [facility] Adult PT Treatment:                                                DATE: 01/26/25 Therapeutic Exercise: UBE  5  Supine BTB ER/IR and horizontal pull Standing forward raise 5 lbs x 10, lateral raise 5 lbs  x10  5 lbs ER  Overhead press 5 lbs  x 8 , 2 ways  Row 15 lb  Single arm horizontal abduction  Corner stretch  High plank +shoulder taps  DB carry 8 lbs various positions  Self Care: Reinforced form with exercise, avoiding exercises that pinch the shoulder or are uncomfortable.  DC and final HEP      PATIENT EDUCATION: Education details: updated and reviewed HEP Person educated: Patient Education method: Explanation, Demonstration, and Handouts Education comprehension: verbalized understanding, verbal cues required, tactile cues required, and needs further education  HOME EXERCISE PROGRAM: Access Code: [email or web address] URL: [email or web address] Date: 12/15/2024 Prepared by: Delon Norma   Exercises - Standing Shoulder Flexion AAROM with Dowel  - 1 x daily - 7 x weekly - 2 sets - 10 reps - 10 hold - Standing Shoulder Abduction AAROM with Dowel  - 1 x daily - 7 x weekly - 2 sets - 10 reps - 10 hold - Standing Shoulder Horizontal Abduction with Resistance  - 1 x daily - 7 x weekly - 2 sets - 10 reps - 5 hold - Shoulder extension with resistance - Neutral  - 1 x daily - 7 x weekly - 2 sets - 10 reps - 5 hold - Standing Shoulder Row with Anchored Resistance  - 1 x daily - 7  x weekly - 2 sets - 10 reps - 5 hold - Standing Shoulder Internal Rotation Stretch with Hands Behind Back  - 2 x daily - 1 sets - 5-10 reps - Shoulder Flexion Wall Slide with Towel  - 2 x daily - 1 sets - 10 reps - Sleeper Stretch  - 1 x daily - 7 x weekly - 1 sets - 10 reps - 10 hold - Standing Shoulder Flexion with Resistance  - 1 x daily - 7 x weekly - 2 sets - 10  reps - 5 hold - Standing Single Arm Shoulder Abduction with Resistance  - 1 x daily - 7 x weekly - 2 sets - 10 reps - 5 hold   ASSESSMENT:  CLINICAL IMPRESSION:   Patient had no pain during his session and tolerated light dumbbells and bands in different positions.  He had a little bit of discomfort with overhead press 5 pounds.  I discussed that is not necessarily dangerous to do this exercise but it is good to check in as it is a functional shoulder pattern.  Continue plan of care he has 2 more visits and should be ready for discharge at that time.   Eval: Patient is a 69y.o. male who was seen today for physical therapy evaluation and treatment for right sided shoulder pain.  Symptoms are consistent with possible mild osteoarthritis but no concern for rotator cuff tear or instability.  OBJECTIVE IMPAIRMENTS: decreased coordination, decreased knowledge of condition, decreased mobility, decreased ROM, decreased strength, hypomobility, increased fascial restrictions, impaired flexibility, impaired UE functional use, and pain.   ACTIVITY LIMITATIONS: carrying, lifting, sleeping, dressing, and reach over head  PARTICIPATION LIMITATIONS: cleaning, community activity, and yard work  PERSONAL FACTORS: Time since onset of injury/illness/exacerbation and 3+ comorbidities: Previous stroke, diabetes, depression are also affecting patient's functional outcome.   REHAB POTENTIAL: Excellent  CLINICAL DECISION MAKING: Stable/uncomplicated  EVALUATION COMPLEXITY: Low   GOALS: Goals reviewed with patient? Yes  SHORT TERM GOALS:  Target date: 12/29/2024    Patient will be independent with initial home exercise program for shoulder mobility and strength Baseline: Goal status: MET   2.  Patient will improve right shoulder flexion, abduction with active assisted range of motion by 15 degrees Baseline: see above, improved 8 deg in flexion, 30 deg in abduction Goal status:MET    LONG TERM GOALS: Target date: 02/09/2025  Patient will be independent with final HEP upon discharge from PT and report consistent benefit following exercise completion.    Baseline:  Goal status: MET   2.  Patient will be able to demonstrate shoulder strength to 5/5 in all planes in order to maximize functional use of UEs for ADLs Baseline:  Goal status: MET   3.  Patient will resume all resistance training at the gym without increased shoulder pain, modifications  Baseline: has dumbbells and modifies at the gym  Goal status:MET   4. Patient will be able to demonstrate proper posture and lifting techniques related to shoulder pain and reduction of symptoms.   Baseline:  Goal status: MET  5.  Quick dash score will improve to 10 or less  Baseline: 18 Goal status: MET, 9%    PLAN:  PT FREQUENCY: 1-2x/week  PT DURATION: 8 weeks  PLANNED INTERVENTIONS: 97164- PT Re-evaluation, 97750- Physical Performance Testing, 97110-Therapeutic exercises, 97530- Therapeutic activity, [id number]- Neuromuscular re-education, 97535- Self Care, [id number]- Manual therapy, Patient/Family education, Joint mobilization, Cryotherapy, and Moist heat  PLAN FOR NEXT SESSION: DC   Delon Norma, PT 01/26/25 9:38 AM Phone: [phone] Fax: [phone]  "

## 2025-05-17 ENCOUNTER — Ambulatory Visit: Admitting: Family Medicine

## 2025-11-14 ENCOUNTER — Ambulatory Visit

## 2025-11-20 ENCOUNTER — Encounter: Admitting: Family Medicine
